# Patient Record
Sex: Female | Born: 1935 | Race: White | Hispanic: No | Smoking: Former smoker
Health system: Southern US, Community
[De-identification: ages and names within clinical notes are randomized; demographics above are authoritative.]

## PROBLEM LIST (undated history)

## (undated) DIAGNOSIS — J189 Pneumonia, unspecified organism: Secondary | ICD-10-CM

## (undated) DIAGNOSIS — K219 Gastro-esophageal reflux disease without esophagitis: Secondary | ICD-10-CM

## (undated) DIAGNOSIS — H3530 Unspecified macular degeneration: Secondary | ICD-10-CM

## (undated) DIAGNOSIS — M1990 Unspecified osteoarthritis, unspecified site: Secondary | ICD-10-CM

## (undated) DIAGNOSIS — R011 Cardiac murmur, unspecified: Secondary | ICD-10-CM

## (undated) DIAGNOSIS — K222 Esophageal obstruction: Secondary | ICD-10-CM

## (undated) DIAGNOSIS — H9190 Unspecified hearing loss, unspecified ear: Secondary | ICD-10-CM

## (undated) DIAGNOSIS — M545 Low back pain, unspecified: Secondary | ICD-10-CM

## (undated) DIAGNOSIS — M7122 Synovial cyst of popliteal space [Baker], left knee: Secondary | ICD-10-CM

## (undated) DIAGNOSIS — G8929 Other chronic pain: Secondary | ICD-10-CM

## (undated) DIAGNOSIS — C189 Malignant neoplasm of colon, unspecified: Secondary | ICD-10-CM

## (undated) DIAGNOSIS — E782 Mixed hyperlipidemia: Secondary | ICD-10-CM

## (undated) DIAGNOSIS — I10 Essential (primary) hypertension: Secondary | ICD-10-CM

## (undated) DIAGNOSIS — Z974 Presence of external hearing-aid: Secondary | ICD-10-CM

## (undated) DIAGNOSIS — T7840XA Allergy, unspecified, initial encounter: Secondary | ICD-10-CM

## (undated) DIAGNOSIS — E7800 Pure hypercholesterolemia, unspecified: Secondary | ICD-10-CM

## (undated) DIAGNOSIS — S83207A Unspecified tear of unspecified meniscus, current injury, left knee, initial encounter: Secondary | ICD-10-CM

## (undated) DIAGNOSIS — H269 Unspecified cataract: Secondary | ICD-10-CM

## (undated) DIAGNOSIS — K743 Primary biliary cirrhosis: Secondary | ICD-10-CM

## (undated) DIAGNOSIS — I7300 Raynaud's syndrome without gangrene: Secondary | ICD-10-CM

## (undated) DIAGNOSIS — I872 Venous insufficiency (chronic) (peripheral): Secondary | ICD-10-CM

## (undated) DIAGNOSIS — Z8719 Personal history of other diseases of the digestive system: Secondary | ICD-10-CM

## (undated) DIAGNOSIS — K449 Diaphragmatic hernia without obstruction or gangrene: Secondary | ICD-10-CM

## (undated) DIAGNOSIS — I499 Cardiac arrhythmia, unspecified: Secondary | ICD-10-CM

## (undated) DIAGNOSIS — M25562 Pain in left knee: Secondary | ICD-10-CM

## (undated) DIAGNOSIS — R296 Repeated falls: Secondary | ICD-10-CM

## (undated) DIAGNOSIS — C44319 Basal cell carcinoma of skin of other parts of face: Secondary | ICD-10-CM

## (undated) DIAGNOSIS — K5730 Diverticulosis of large intestine without perforation or abscess without bleeding: Secondary | ICD-10-CM

## (undated) DIAGNOSIS — Z85038 Personal history of other malignant neoplasm of large intestine: Secondary | ICD-10-CM

## (undated) HISTORY — DX: Unspecified osteoarthritis, unspecified site: M19.90

## (undated) HISTORY — PX: KNEE ARTHROSCOPY: SHX127

## (undated) HISTORY — DX: Cardiac murmur, unspecified: R01.1

## (undated) HISTORY — DX: Primary biliary cirrhosis: K74.3

## (undated) HISTORY — PX: POLYPECTOMY: SHX149

## (undated) HISTORY — PX: APPENDECTOMY: SHX54

## (undated) HISTORY — DX: Essential (primary) hypertension: I10

## (undated) HISTORY — DX: Diverticulosis of large intestine without perforation or abscess without bleeding: K57.30

## (undated) HISTORY — DX: Allergy, unspecified, initial encounter: T78.40XA

## (undated) HISTORY — PX: MOUTH SURGERY: SHX715

## (undated) HISTORY — PX: COLONOSCOPY: SHX174

## (undated) HISTORY — PX: CATARACT EXTRACTION W/ INTRAOCULAR LENS  IMPLANT, BILATERAL: SHX1307

## (undated) HISTORY — DX: Unspecified cataract: H26.9

## (undated) HISTORY — DX: Personal history of other malignant neoplasm of large intestine: Z85.038

## (undated) HISTORY — DX: Gastro-esophageal reflux disease without esophagitis: K21.9

## (undated) HISTORY — DX: Unspecified macular degeneration: H35.30

---
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
## 1971-05-06 HISTORY — PX: TUBAL LIGATION: SHX77

## 2000-11-18 ENCOUNTER — Encounter: Admission: RE | Admit: 2000-11-18 | Discharge: 2000-11-18 | Payer: Self-pay | Admitting: Internal Medicine

## 2000-11-18 ENCOUNTER — Encounter: Payer: Self-pay | Admitting: Internal Medicine

## 2001-02-05 DIAGNOSIS — K5730 Diverticulosis of large intestine without perforation or abscess without bleeding: Secondary | ICD-10-CM | POA: Insufficient documentation

## 2003-09-29 ENCOUNTER — Encounter: Admission: RE | Admit: 2003-09-29 | Discharge: 2003-09-29 | Payer: Self-pay | Admitting: Internal Medicine

## 2005-04-21 ENCOUNTER — Ambulatory Visit: Payer: Self-pay | Admitting: Gastroenterology

## 2005-04-22 ENCOUNTER — Encounter (INDEPENDENT_AMBULATORY_CARE_PROVIDER_SITE_OTHER): Payer: Self-pay | Admitting: Specialist

## 2005-04-22 ENCOUNTER — Ambulatory Visit: Payer: Self-pay | Admitting: Gastroenterology

## 2005-05-13 ENCOUNTER — Ambulatory Visit: Payer: Self-pay | Admitting: Gastroenterology

## 2005-06-09 ENCOUNTER — Ambulatory Visit: Payer: Self-pay | Admitting: Gastroenterology

## 2005-06-27 ENCOUNTER — Ambulatory Visit: Payer: Self-pay | Admitting: Gastroenterology

## 2005-06-27 ENCOUNTER — Encounter: Admission: RE | Admit: 2005-06-27 | Discharge: 2005-06-27 | Payer: Self-pay | Admitting: Internal Medicine

## 2005-08-01 ENCOUNTER — Encounter: Admission: RE | Admit: 2005-08-01 | Discharge: 2005-08-01 | Payer: Self-pay | Admitting: Internal Medicine

## 2005-08-19 ENCOUNTER — Ambulatory Visit: Payer: Self-pay | Admitting: Gastroenterology

## 2005-08-25 ENCOUNTER — Ambulatory Visit: Payer: Self-pay | Admitting: Gastroenterology

## 2005-08-29 ENCOUNTER — Ambulatory Visit: Payer: Self-pay | Admitting: Gastroenterology

## 2005-08-29 DIAGNOSIS — K5190 Ulcerative colitis, unspecified, without complications: Secondary | ICD-10-CM | POA: Insufficient documentation

## 2005-09-30 ENCOUNTER — Ambulatory Visit: Payer: Self-pay | Admitting: Gastroenterology

## 2005-11-21 ENCOUNTER — Ambulatory Visit: Payer: Self-pay | Admitting: Gastroenterology

## 2005-11-27 DIAGNOSIS — H269 Unspecified cataract: Secondary | ICD-10-CM

## 2006-02-13 ENCOUNTER — Ambulatory Visit: Payer: Self-pay | Admitting: Gastroenterology

## 2006-05-05 DIAGNOSIS — K743 Primary biliary cirrhosis: Secondary | ICD-10-CM

## 2006-05-05 DIAGNOSIS — Z85828 Personal history of other malignant neoplasm of skin: Secondary | ICD-10-CM

## 2006-05-05 HISTORY — DX: Personal history of other malignant neoplasm of skin: Z85.828

## 2006-05-05 HISTORY — DX: Primary biliary cirrhosis: K74.3

## 2006-05-08 ENCOUNTER — Ambulatory Visit: Payer: Self-pay | Admitting: Gastroenterology

## 2006-08-03 ENCOUNTER — Ambulatory Visit: Payer: Self-pay | Admitting: Gastroenterology

## 2006-08-17 ENCOUNTER — Ambulatory Visit: Payer: Self-pay | Admitting: Gastroenterology

## 2006-08-27 ENCOUNTER — Ambulatory Visit: Payer: Self-pay | Admitting: Gastroenterology

## 2006-08-27 LAB — CONVERTED CEMR LAB
Bilirubin, Direct: 0.2 mg/dL (ref 0.0–0.3)
INR: 0.9 (ref 0.9–2.0)
Iron: 79 ug/dL (ref 42–145)
Saturation Ratios: 21.9 % (ref 20.0–50.0)
Total Bilirubin: 0.9 mg/dL (ref 0.3–1.2)
Total Protein: 7.3 g/dL (ref 6.0–8.3)
Transferrin: 257.4 mg/dL (ref >=212.0)

## 2006-09-18 ENCOUNTER — Ambulatory Visit (HOSPITAL_COMMUNITY): Admission: RE | Admit: 2006-09-18 | Discharge: 2006-09-18 | Payer: Self-pay | Admitting: Gastroenterology

## 2006-09-30 ENCOUNTER — Ambulatory Visit: Payer: Self-pay | Admitting: Gastroenterology

## 2006-10-05 LAB — CONVERTED CEMR LAB: aPTT: 27.8 s (ref 26.5–36.5)

## 2006-10-28 ENCOUNTER — Encounter: Payer: Self-pay | Admitting: Gastroenterology

## 2006-10-28 ENCOUNTER — Ambulatory Visit (HOSPITAL_COMMUNITY): Admission: RE | Admit: 2006-10-28 | Discharge: 2006-10-28 | Payer: Self-pay | Admitting: Gastroenterology

## 2006-11-02 ENCOUNTER — Ambulatory Visit: Payer: Self-pay | Admitting: Gastroenterology

## 2006-11-11 ENCOUNTER — Ambulatory Visit: Payer: Self-pay | Admitting: Gastroenterology

## 2006-12-23 ENCOUNTER — Ambulatory Visit: Payer: Self-pay | Admitting: Gastroenterology

## 2006-12-23 LAB — CONVERTED CEMR LAB
ALT: 20 units/L (ref 0–35)
AST: 19 units/L (ref 0–37)
Albumin: 3.4 g/dL — ABNORMAL LOW (ref 3.5–5.2)
Alkaline Phosphatase: 64 units/L (ref 39–117)
Total Bilirubin: 0.9 mg/dL (ref 0.3–1.2)

## 2007-08-05 ENCOUNTER — Encounter: Admission: RE | Admit: 2007-08-05 | Discharge: 2007-08-05 | Payer: Self-pay | Admitting: Internal Medicine

## 2007-08-23 DIAGNOSIS — I10 Essential (primary) hypertension: Secondary | ICD-10-CM

## 2007-08-23 DIAGNOSIS — M129 Arthropathy, unspecified: Secondary | ICD-10-CM | POA: Insufficient documentation

## 2008-05-25 ENCOUNTER — Telehealth: Payer: Self-pay | Admitting: Gastroenterology

## 2008-06-07 ENCOUNTER — Ambulatory Visit: Payer: Self-pay | Admitting: Gastroenterology

## 2008-06-07 DIAGNOSIS — K745 Biliary cirrhosis, unspecified: Secondary | ICD-10-CM

## 2008-06-07 LAB — CONVERTED CEMR LAB
ALT: 14 units/L (ref 0–35)
AST: 18 units/L (ref 0–37)
Albumin: 3.5 g/dL (ref 3.5–5.2)
Alkaline Phosphatase: 48 units/L (ref 39–117)
BUN: 17 mg/dL (ref 6–23)
Basophils Absolute: 0 10*3/uL (ref 0.0–0.1)
Bilirubin, Direct: 0.1 mg/dL (ref 0.0–0.3)
Eosinophils Absolute: 0.2 10*3/uL (ref 0.0–0.7)
Eosinophils Relative: 3.3 % (ref 0.0–5.0)
HCT: 44.6 % (ref 36.0–46.0)
MCHC: 34.6 g/dL (ref 30.0–36.0)
MCV: 97.4 fL (ref 78.0–100.0)
Monocytes Absolute: 0.4 10*3/uL (ref 0.1–1.0)
Platelets: 193 10*3/uL (ref 150–400)
Potassium: 4.4 meq/L (ref 3.5–5.1)
RDW: 12.4 % (ref 11.5–14.6)
WBC: 6 10*3/uL (ref 4.5–10.5)

## 2008-08-02 ENCOUNTER — Encounter: Payer: Self-pay | Admitting: Gastroenterology

## 2008-08-29 ENCOUNTER — Encounter: Admission: RE | Admit: 2008-08-29 | Discharge: 2008-08-29 | Payer: Self-pay | Admitting: Internal Medicine

## 2009-06-06 ENCOUNTER — Telehealth: Payer: Self-pay | Admitting: Gastroenterology

## 2009-06-07 ENCOUNTER — Encounter: Payer: Self-pay | Admitting: Gastroenterology

## 2009-07-04 ENCOUNTER — Ambulatory Visit: Payer: Self-pay | Admitting: Gastroenterology

## 2009-07-04 LAB — CONVERTED CEMR LAB
ALT: 14 units/L (ref 0–35)
AST: 18 units/L (ref 0–37)
Albumin: 3.6 g/dL (ref 3.5–5.2)
Eosinophils Relative: 5.1 % — ABNORMAL HIGH (ref 0.0–5.0)
HCT: 46.8 % — ABNORMAL HIGH (ref 36.0–46.0)
Hemoglobin: 15.4 g/dL — ABNORMAL HIGH (ref 12.0–15.0)
Lymphs Abs: 1.7 10*3/uL (ref 0.7–4.0)
MCV: 99.5 fL (ref 78.0–100.0)
Monocytes Relative: 7.2 % (ref 3.0–12.0)
Neutro Abs: 2.8 10*3/uL (ref 1.4–7.7)
RDW: 12.8 % (ref 11.5–14.6)
Total Protein: 7 g/dL (ref 6.0–8.3)
WBC: 5.2 10*3/uL (ref 4.5–10.5)

## 2009-07-10 ENCOUNTER — Telehealth: Payer: Self-pay | Admitting: Gastroenterology

## 2009-09-27 ENCOUNTER — Encounter: Admission: RE | Admit: 2009-09-27 | Discharge: 2009-09-27 | Payer: Self-pay | Admitting: Internal Medicine

## 2010-06-04 NOTE — Assessment & Plan Note (Signed)
Summary: Yearly F/U for refills-rs    History of Present Illness Visit Type: Follow-up Visit Primary GI MD: Erskine Emery MD [facility] Primary Provider: Crist Infante MD Chief Complaint: Yearly F/U Colitis, med refills History of Present Illness:   Heidi [name] has returned for her annual checkup for left sided colitis and primary biliary cirrhosis.  She is on maintenance medicines including lialda and Urso.  She has no GI complaints.   GI Review of Systems      Denies abdominal pain, acid reflux, belching, bloating, chest pain, dysphagia with liquids, dysphagia with solids, heartburn, loss of appetite, nausea, vomiting, vomiting blood, weight loss, and  weight gain.        Denies anal fissure, black tarry stools, change in bowel habit, constipation, diarrhea, diverticulosis, fecal incontinence, heme positive stool, hemorrhoids, irritable bowel syndrome, jaundice, light color stool, liver problems, rectal bleeding, and  rectal pain.    Current Medications (verified): 1)  Lialda 1.2 Gm Tbec (Mesalamine) .Marland Kitchen.. 1 By Mouth Two Times A Day 2)  Ursodiol 300 Mg Caps (Ursodiol) .... Take 1 Tablet By Mouth Two Times A Day 3)  Aspirin 81 Mg  Tabs (Aspirin) .Marland Kitchen.. 1 By Mouth Once Daily 4)  Calcium Carbonate-Vitamin D 600-400 Mg-Unit  Tabs (Calcium Carbonate-Vitamin D) .Marland Kitchen.. 1 By Mouth Two Times A Day  Allergies (verified): 1)  ! Penicillin 2)  ! Codeine  Past History:  Past Medical History: Reviewed history from 08/23/2007 and no changes required. Current Problems:  ARTHRITIS (ICD-716.90) CATARACT, LEFT EYE (ICD-366.9) DIVERTICULOSIS, COLON (ICD-562.10) ULCERATIVE COLITIS (ICD-556.9) HYPERTENSION (ICD-401.9)  Past Surgical History: TUBAL LIGATION Tonsillectomy Knee Arthroscopy  Family History: Reviewed history from 06/07/2008 and no changes required. Family History of Breast Cancer:aunt Family History of Colon Cancer:grandmother Family History of Heart Disease: mother brother  Social  History: Reviewed history from 06/07/2008 and no changes required. Occupation: compliance Patient currently smokes.  1/2 ppd Alcohol Use - yes1 glass wine every night Daily Caffeine Use 4 cups per day Illicit Drug Use - no Patient gets regular exercise.  Review of Systems       All other systems were reviewed and were negative   Vital Signs:  Patient profile:   76 year old female Height:      63 inches Weight:      119.38 pounds BMI:     21.22 Pulse rate:   72 / minute Pulse rhythm:   regular BP sitting:   132 / 76  (left arm) Cuff size:   regular  Vitals Entered By: June McMurray [place] Deborra Medina) (July 04, 2009 8:34 AM)  Physical Exam  Additional Exam:  She is a healthy-appearing female  skin: anicteric HEENT: normocephalic; PEERLA; no nasal or pharyngeal abnormalities neck: supple nodes: no cervical lymphadenopathy chest: clear to ausculatation and percussion heart: no murmurs, gallops, or rubs abd: soft, nontender; BS normoactive; no abdominal masses, tenderness, organomegaly rectal: deferred ext: no cynanosis, clubbing, edema skeletal: no deformities neuro: oriented x 3; no focal abnormalities    Impression & Recommendations:  Problem # 1:  ULCERATIVE COLITIS (ICD-556.9) She has left-sided colitis diagnosed in 2006.  She remains in remission on lialda   Orders: TLB-CBC Platelet - w/Differential (85025-CBCD) TLB-Hepatic/Liver Function Pnl (80076-HEPATIC)  Problem # 2:  BILIARY CIRRHOSIS (ICD-571.6) She has biopsy-proven per hour daily cirrhosis.  Plan to continue Urso Orders: TLB-CBC Platelet - w/Differential (85025-CBCD) TLB-Hepatic/Liver Function Pnl (80076-HEPATIC)  Patient Instructions: 1)  Go to the basement for labs today. 2)  Your refills are sent  to [facility]. 3)  Copy sent to : Crist Infante MD 4)  The medication list was reviewed and reconciled.  All changed / newly prescribed medications were explained.  A complete medication list was provided to  the patient / caregiver. 5)  CC Dr. Crist Infante Prescriptions: LIALDA 1.2 GM TBEC (MESALAMINE) 1 by mouth two times a day  #180 x 3   Entered by:   Bernita Buffy CMA ([place])   Authorized by:   Inda Castle MD   Signed by:   Inda Castle MD on 07/04/2009   Method used:   Electronically to        [facility] (mail-order)             ,          Ph: [phone]       Fax: [phone]   RxID:   [id number]   Appended Document: Yearly F/U for refills-rs    Clinical Lists Changes  Medications: Rx of URSODIOL 300 MG CAPS (URSODIOL) Take 1 tablet by mouth two times a day;  #180 x 3;  Signed;  Entered by: Bernita Buffy CMA ([facility]);  Authorized by: Inda Castle MD;  Method used: Electronically to [facility]*, , ,   , Ph: [phone], Fax: [phone]    Prescriptions: URSODIOL 300 MG CAPS (URSODIOL) Take 1 tablet by mouth two times a day  #180 x 3   Entered by:   Bernita Buffy CMA ([place])   Authorized by:   Inda Castle MD   Signed by:   Bernita Buffy CMA ([place]) on 07/04/2009   Method used:   Electronically to        [facility] (mail-order)             ,          Ph: [phone]       Fax: [phone]   RxID:   [id number]

## 2010-06-04 NOTE — Progress Notes (Signed)
Summary: Med Refill   Phone Note Call from Patient Call back at Work Phone [phone] Call back at [phone]   Call For: Dr Deatra Ina Summary of Call: Heidi [name] if she will need an appoinment to get her med refilled. Has enough medicine to last her until April but uses [employer] order. Initial call taken by: Irwin Brakeman [facility],  June 06, 2009 9:39 AM  Follow-up for Phone Call        Tried to return pts call. Pt needs yearly office appointment scheduled for March. She has enough refills to last till April. Needs follow up before then.  Follow-up by: Genella Mech CMA Deborra Medina),  June 06, 2009 2:21 PM  Additional Follow-up for Phone Call Additional follow up Details #1::        Tried to return pts call again still no answer. Additional Follow-up by: Genella Mech CMA Deborra Medina),  June 07, 2009 9:28 AM    Additional Follow-up for Phone Call Additional follow up Details #2::    Will mail pt letter. Scheduled follow for Wed. 07/04/2009 at 8:45am. For yearly visit and med refills Follow-up by: Genella Mech CMA Deborra Medina),  June 07, 2009 9:31 AM

## 2010-06-04 NOTE — Letter (Signed)
Summary: Appointment Reminder  [facility]  [address] [place], [place] [address]   Phone: [phone]  Fax: [phone]        June 07, 2009 MRN: [id number]    [facility] [place],   [address]    Dear Ms. Kinser,   We have been unable to reach you by phone to schedule a follow up   appointment that was recommended for you by Dr.Kaplan. We have scheduled   your follow up appointment for 07/04/2009 at 8:45 AM. Please bring in all   meds and co-pay. If you need to call and cancel please do so 24 hours in   advance to avoid a 50.00 no show fee.We hope that you allow [place] to   participate in your health care needs. Please contact us at  [phone] at your earliest convenience if you have any questions.         Sincerely,    Genella Mech CMA ([place])

## 2010-06-04 NOTE — Progress Notes (Signed)
Summary: Question about her meds.   Phone Note Call from Patient Call back at [phone]   Call For: Dr Deatra Ina Summary of Call: Question about her medications ordered on the 2nd. when she was here. Initial call taken by: Irwin Brakeman [facility],  July 10, 2009 8:26 AM  Follow-up for Phone Call         Message left to call back   Abel Presto RN  July 10, 2009 10:31 AM  Pt.asking if Smith Mince has been ordered correctly-said she had  been on it three times a day but now ordered b.i.d. Also was taking LIalda tablets both at one time but order now says b.i.d. Please clarify. Follow-up by: Abel Presto RN,  July 10, 2009 10:45 AM  Additional Follow-up for Phone Call Additional follow up Details #1::        urso is two times a day lialda is 2 tabs once daily  Additional Follow-up by: Inda Castle MD,  July 10, 2009 11:47 AM    Additional Follow-up for Phone Call Additional follow up Details #2::    Medication orders reviwed with pt. Follow-up by: Abel Presto RN,  July 10, 2009 12:01 PM

## 2010-07-11 ENCOUNTER — Encounter: Payer: Self-pay | Admitting: Gastroenterology

## 2010-07-25 ENCOUNTER — Encounter: Payer: Self-pay | Admitting: Gastroenterology

## 2010-07-31 ENCOUNTER — Other Ambulatory Visit (INDEPENDENT_AMBULATORY_CARE_PROVIDER_SITE_OTHER): Payer: Medicare Other

## 2010-07-31 ENCOUNTER — Encounter: Payer: Self-pay | Admitting: Gastroenterology

## 2010-07-31 ENCOUNTER — Ambulatory Visit (INDEPENDENT_AMBULATORY_CARE_PROVIDER_SITE_OTHER): Payer: Medicare Other | Admitting: Gastroenterology

## 2010-07-31 VITALS — BP 122/70 | HR 60 | Ht 63.0 in | Wt 116.6 lb

## 2010-07-31 DIAGNOSIS — K745 Biliary cirrhosis, unspecified: Secondary | ICD-10-CM

## 2010-07-31 DIAGNOSIS — K219 Gastro-esophageal reflux disease without esophagitis: Secondary | ICD-10-CM

## 2010-07-31 DIAGNOSIS — K5100 Ulcerative (chronic) pancolitis without complications: Secondary | ICD-10-CM

## 2010-07-31 DIAGNOSIS — K5190 Ulcerative colitis, unspecified, without complications: Secondary | ICD-10-CM

## 2010-07-31 LAB — HEPATIC FUNCTION PANEL
ALT: 13 U/L (ref 0–35)
AST: 17 U/L (ref 0–37)
Bilirubin, Direct: 0.1 mg/dL (ref 0.0–0.3)
Total Bilirubin: 0.6 mg/dL (ref 0.3–1.2)

## 2010-07-31 LAB — CBC WITH DIFFERENTIAL/PLATELET
Eosinophils Relative: 5.1 % — ABNORMAL HIGH (ref 0.0–5.0)
Lymphocytes Relative: 35.5 % (ref 12.0–46.0)
Monocytes Absolute: 0.4 10*3/uL (ref 0.1–1.0)
Monocytes Relative: 6.8 % (ref 3.0–12.0)
Neutrophils Relative %: 52.3 % (ref 43.0–77.0)
Platelets: 184 10*3/uL (ref 150.0–400.0)
WBC: 5.6 10*3/uL (ref 4.5–10.5)

## 2010-07-31 MED ORDER — OMEPRAZOLE 20 MG PO CPDR: 20.0000 mg | DELAYED_RELEASE_CAPSULE | Freq: Every day | ORAL | Status: DC

## 2010-07-31 MED ORDER — URSODIOL 300 MG PO CAPS: 300.0000 mg | ORAL_CAPSULE | Freq: Two times a day (BID) | ORAL | Status: DC

## 2010-07-31 MED ORDER — MESALAMINE 1.2 G PO TBEC: 2.4000 g | DELAYED_RELEASE_TABLET | Freq: Every day | ORAL | Status: DC

## 2010-07-31 NOTE — Patient Instructions (Addendum)
Upper GI Endoscopy Upper GI endoscopy means using a flexible scope to look at the esophagus, stomach and upper small bowel. This is done to make a diagnosis in people with heartburn, abdominal pain, or abnormal bleeding. Sometimes an endoscope is needed to remove foreign bodies or food that become stuck in the esophagus; it can also be used to take biopsy samples. For the best results, do not eat or drink for 8 hours before having your upper endoscopy.  To perform the endoscopy, you will probably be sedated and your throat will be numbed with a special spray. The endoscope is then slowly passed down your throat (this will not interfere with your breathing). An endoscopy exam takes 15-30 minutes to complete and there is no real pain. Patients rarely remember much about the procedure. The results of the test may take several days if a biopsy or other test is taken.  You may have a sore throat after an endoscopy exam. Serious complications are very rare. Stick to liquids and soft foods until your pain is better. You should not drive a car or operate any dangerous equipment for at least 24 hours after being sedated. SEEK IMMEDIATE MEDICAL CARE IF:  You have severe throat pain.   You have shortness of breath.   You have bleeding problems.   You have a fever.   You have difficulty recovering from your sedation.  Document Released: 05/29/2004 Document Re-Released: 07/16/2009 [facility] Patient Information 2011 [place]. Your EGD is scheduled on 08/02/2010 at 3pm CC.Mark Perini,MD Medications were resent to patients med co mail order

## 2010-07-31 NOTE — Progress Notes (Signed)
History of Present Illness:      Review of Systems: Pertinent positive and negative review of systems were noted in the above HPI section. All other review of systems were otherwise negative.    Current Medications, Allergies, Past Medical History, Past Surgical History, Family History and Social History were reviewed in [facility] record  Vital signs were reviewed in today's medical record. Physical Exam: General: Well developed , well nourished, no acute distress Head: Normocephalic and atraumatic Eyes:  sclerae anicteric, EOMI Ears: Normal auditory acuity Mouth: No deformity or lesions Lungs: Clear throughout to auscultation Heart: Regular rate and rhythm; no murmurs, rubs or bruits Abdomen: Soft, non tender and non distended. No masses, hepatosplenomegaly or hernias noted. Normal Bowel sounds Rectal:deferred Musculoskeletal: Symmetrical with no gross deformities  Pulses:  Normal pulses noted Extremities: No clubbing, cyanosis, edema or deformities noted Neurological: Alert oriented x 4, grossly nonfocal Psychological:  Alert and cooperative. Normal mood and affect   Heidi [name] has returned for her annual checkup for her left-sided ulcerative colitis. This was diagnosed in 2006. On lialda she is without lower GI complaints. She also has primary biliary cirrhosis for which she takes urso.  She denies abdominal pain or pruritus.  The patient is complaining of frequent pyrosis and dysphagia to solids. She's on no acid suppression medications.

## 2010-07-31 NOTE — Assessment & Plan Note (Signed)
She remains in clinical remission on Urso. Plan to continue current medications.

## 2010-07-31 NOTE — Assessment & Plan Note (Signed)
This is a new problem, complicated by dysphagia, no doubt related to an early esophageal stricture.  Recommendations #1 begin omeprazole 20 mg a day. #2 upper endoscopy with dilatation as indicated  Risks, alternatives, and complications of the procedure, including bleeding, perforation, and possible need for surgery, were explained to the patient.  Patient's questions were answered.

## 2010-07-31 NOTE — Assessment & Plan Note (Addendum)
Left-sided colitis, diagnosed 2006. She is symptom-free on lialda.   Recommendations #1 check daily CBC and LFTs. #2 continue  lialda

## 2010-08-01 ENCOUNTER — Encounter: Payer: Self-pay | Admitting: Gastroenterology

## 2010-08-02 ENCOUNTER — Encounter: Payer: Self-pay | Admitting: Gastroenterology

## 2010-08-02 ENCOUNTER — Ambulatory Visit (AMBULATORY_SURGERY_CENTER): Payer: Medicare Other | Admitting: Gastroenterology

## 2010-08-02 VITALS — HR 54 | Temp 96.7°F | Resp 20 | Ht 62.5 in | Wt 116.0 lb

## 2010-08-02 DIAGNOSIS — R1310 Dysphagia, unspecified: Secondary | ICD-10-CM

## 2010-08-02 DIAGNOSIS — K259 Gastric ulcer, unspecified as acute or chronic, without hemorrhage or perforation: Secondary | ICD-10-CM

## 2010-08-02 DIAGNOSIS — K222 Esophageal obstruction: Secondary | ICD-10-CM

## 2010-08-02 DIAGNOSIS — K319 Disease of stomach and duodenum, unspecified: Secondary | ICD-10-CM

## 2010-08-02 NOTE — Patient Instructions (Addendum)
Discharge Instructions reviewed  [place] diet reviewed  Continue Prilosec  Multiple ulcers found in pyloric channel, also in antrum

## 2010-08-05 ENCOUNTER — Telehealth: Payer: Self-pay | Admitting: *Deleted

## 2010-08-05 NOTE — Telephone Encounter (Signed)
Follow up Call- Patient questions:  Do you have a fever, pain , or abdominal swelling? no Pain Score  0 *  Have you tolerated food without any problems? yes  Have you been able to return to your normal activities? yes  Do you have any questions about your discharge instructions: Diet   no Medications  no Follow up visit  no  Do you have questions or concerns about your Care? no  Actions: * If pain score is 4 or above: No action needed, pain <4.  Pt told will get results letter in 2  weeks

## 2010-08-06 ENCOUNTER — Encounter: Payer: Self-pay | Admitting: Gastroenterology

## 2010-08-06 NOTE — Procedures (Signed)
Summary: Upper Endoscopy  Patient: Alekhya Gravlin Note: All result statuses are Final unless otherwise noted.  Tests: (1) Upper Endoscopy (EGD)   EGD Upper Endoscopy       [facility]     [place], [place]  [address]          ENDOSCOPY PROCEDURE REPORT          PATIENT:  Heidi [name], Heidi [name]  MR#:  [id number]     BIRTHDATE:  05-27-1934, 83 yrs. old  GENDER:  female          ENDOSCOPIST:  Sandy Salaam. Deatra Ina, MD     Referred by:  Crist Infante, M.D.          PROCEDURE DATE:  08/02/2010     PROCEDURE:  EGD with biopsy, [address], Maloney Dilation of Esophagus     ASA CLASS:  Class II     INDICATIONS:  dysphagia          MEDICATIONS:   Fentanyl 50 mcg IV, Versed 3 mg IV, glycopyrrolate     ([place]) 0.2 mg IV, 0.6cc simethancone 0.6 cc PO     TOPICAL ANESTHETIC:  Exactacain Spray          DESCRIPTION OF PROCEDURE:   After the risks benefits and     alternatives of the procedure were thoroughly explained, informed     consent was obtained.  The [facility] [id number] endoscope was     introduced through the mouth and advanced to the third portion of     the duodenum, without limitations.  The instrument was slowly     withdrawn as the mucosa was fully examined.     <<PROCEDUREIMAGES>>          A stricture was found at the gastroesophageal junction (see     image11). Moderate esophageal stricture Dilation with maloney     dilator 34m Mild resistance; no heme  Multiple ulcers were found     pyloric channel 2 superficial ulcers; bxs taken (see image6 and     image7).  An ulcer was found in the antrum (see image5).     Superficial linear ulcer  Otherwise the examination was normal     (see image3, image4, image9, image10, and image13).    Retroflexed     views revealed no abnormalities.    The scope was then withdrawn     from the patient and the procedure completed.          COMPLICATIONS:  None          ENDOSCOPIC IMPRESSION:     1) Stricture at  the gastroesophageal junction - s/p maloney     dilitation     2) Ulcers, multiple in the pyloric channel     3) Ulcer in the antrum     4) Otherwise normal examination     RECOMMENDATIONS:     1) continue PPI     2) dilatations PRN          REPEAT EXAM:  No          ______________________________     RSandy Salaam KDeatra Ina MD          CC:          n.     eSIGNED:   RSandy Salaam Kaplan at 08/02/2010 03:28 PM          SDespina Hick [id number] Note: An exclamation mark (Marland Kitchen  indicates a result that was not dispersed into the flowsheet. Document Creation Date: 08/02/2010 3:28 PM _______________________________________________________________________  (1) Order result status: Final Collection or observation date-time: 08/02/2010 15:05 Requested date-time:  Receipt date-time:  Reported date-time:  Referring Physician:   Ordering Physician: Erskine Emery [id number]) Specimen Source:  Source: Tawanna Cooler Order Number: [id number] Lab site:

## 2010-09-17 NOTE — Assessment & Plan Note (Signed)
[facility]                         GASTROENTEROLOGY OFFICE NOTE   NAME:Smedley, JAMILETH PUTZIER                     MRN:          [id number]  DATE:09/30/2006                            DOB:          July 11, 1934    PROBLEM:  Abnormal liver tests.   Mrs. Levee has returned for scheduled followup. Serologies were  positive for an antimitochondrial antibody of 74.8 and anti-smooth  muscle antibody of 88. An MRCP did not demonstrate any abnormalities.  Mrs. Benningfield is feeling well. Her colitis is in remission.   PHYSICAL EXAMINATION:  Pulse 56, blood pressure 114/64, weight 124.   IMPRESSION:  Abnormal liver tests. I am suspicious that she has either  primary biliary cirrhosis or sclerosing cholangitis. Certainly the  latter is associated with ulcerative colitis. Recommendations is liver  biopsy.     Sandy Salaam. Deatra Ina, MD,FACG  Electronically Signed    RDK/MedQ  DD: 09/30/2006  DT: 09/30/2006  Job #: [id number]   cc:   Elta [place] A. Perini, M.D.

## 2010-09-17 NOTE — Assessment & Plan Note (Signed)
[facility]                         GASTROENTEROLOGY OFFICE NOTE   NAME:[name], Heidi YOHE                     MRN:          [id number]  DATE:11/11/2006                            DOB:          06-Mar-1935    PROBLEM:  Abnormal liver tests.   [name] has returned following liver biopsy.  Biopsies were  consistent with primary biliary cirrhosis.  [name] remains symptom  free from this abnormality, and her colitis symptoms are well  controlled.   EXAMINATION:  Pulse 64.  Blood pressure 114/72.  Weight 123.   IMPRESSION:  1. Primary biliary cirrhosis.  This certainly would account for her      liver test abnormalities.  2. Colitis.   RECOMMENDATIONS:  Begin Actigall 300 mg 3 times a day.  Continue Lialda  2.4 gm daily.     Sandy Salaam. Deatra Ina, MD,FACG  Electronically Signed    RDK/MedQ  DD: 11/11/2006  DT: 11/11/2006  Job #: [id number]   cc:   Elta Gu[place] A. Perini, M.D.

## 2010-09-20 ENCOUNTER — Other Ambulatory Visit: Payer: Self-pay | Admitting: Gastroenterology

## 2010-09-20 NOTE — Assessment & Plan Note (Signed)
[facility]                         GASTROENTEROLOGY OFFICE NOTE   NAME:Kaucher, SHANIAH BALTES                     MRN:          [id number]  DATE:08/27/2006                            DOB:          01-03-1935    PROBLEM:  1. Colitis.  2. Abnormal liver test.   Ms. Schewe has returned for scheduled GI followup.  She has left-sided  colitis for which she is taking Lialda 2.4 grams a day.  Recent abnormal  liver tests were noted in conjunction with a drug study protocol where  she was receiving either study drug or placebo.  Her colitic symptoms  have been well controlled.  She is without pain or diarrhea.  She does  note that she has had intermittent abnormal liver tests for the past 10  years.  There is no history of jaundice, hepatitis, drug use, or  excessive alcohol use.   Other medications include calcium and baby aspirin.   EXAMINATION:  Pulse 72, blood pressure 122/82, weight 124.   On August 18, 2006 ALT was 171, AST 125, other liver tests were normal.  On August 04, 2006 ALT was 62 and AST was 51.  Both tests were drawn  during the study protocol.  On May 11, 2006 liver tests were normal.   IMPRESSION:  Abnormal liver tests.  This has been a chronic problem that  preceded drug trials.  She could have an underlying chronic hepatitis or  other causes for chronic liver disease including hemochromatosis or PBC.  With her history of ulcerative colitis I have some concern that she  could have sclerosing cholangitis.   RECOMMENDATIONS:  1. Check serologies for hepatitis B and C, AMA, ANA, anti-smooth      muscle antibody, iron, TIBC and ferritin levels.  2. To consider MRI and MRCP if there is some question of sclerosing      cholangitis.     Sandy Salaam. Deatra Ina, MD,FACG  Electronically Signed    RDK/MedQ  DD: 08/27/2006  DT: 08/27/2006  Job #: [id number]   cc:   Elta [place] A. Perini, M.D.

## 2010-10-02 ENCOUNTER — Other Ambulatory Visit: Payer: Self-pay | Admitting: Gastroenterology

## 2010-10-02 DIAGNOSIS — K219 Gastro-esophageal reflux disease without esophagitis: Secondary | ICD-10-CM

## 2010-10-02 MED ORDER — OMEPRAZOLE 20 MG PO CPDR: 20.0000 mg | DELAYED_RELEASE_CAPSULE | Freq: Every day | ORAL | Status: DC

## 2010-10-02 NOTE — Telephone Encounter (Signed)
Pt informed med sent l/m

## 2011-03-25 ENCOUNTER — Encounter (INDEPENDENT_AMBULATORY_CARE_PROVIDER_SITE_OTHER): Payer: Medicare Other | Admitting: Ophthalmology

## 2011-03-25 DIAGNOSIS — H43819 Vitreous degeneration, unspecified eye: Secondary | ICD-10-CM

## 2011-03-25 DIAGNOSIS — H3530 Unspecified macular degeneration: Secondary | ICD-10-CM

## 2011-04-22 ENCOUNTER — Telehealth: Payer: Self-pay | Admitting: Gastroenterology

## 2011-04-22 NOTE — Telephone Encounter (Signed)
Patient reports that she sees some pink in the bowl after a BM.  She does not see blood in the stool or on the tissue.  No other GI complaints at this time.  She has a history of UC.  She denies diarrhea, urgency, or abdominal cramping.  She is currently treated with Lialda 2 a day.  Dr Olevia Perches please advise you are MD of the day.

## 2011-04-23 NOTE — Telephone Encounter (Signed)
Last flex sigm 04/2005- UC. Please start Proctofoam #1, apply to rectum qhs, if too expensive, use Cort-enema 1 hs,x 14 days then taper to 3x /week x 2 weeks then 1x /week,, # 30, 1 refill. If symptoms  continue, please schedule for colonoscopy, she is due anyway.

## 2011-04-24 MED ORDER — HYDROCORTISONE ACE-PRAMOXINE 1-1 % RE FOAM: 1.0000 | Freq: Every day | RECTAL | Status: AC

## 2011-04-24 NOTE — Telephone Encounter (Signed)
Patient advised of Dr Nichola Sizer recommendations.  She will call back for further issues

## 2011-04-24 NOTE — Telephone Encounter (Signed)
Left message for patient to call back  

## 2011-05-27 ENCOUNTER — Encounter: Payer: Self-pay | Admitting: Gastroenterology

## 2011-05-27 ENCOUNTER — Ambulatory Visit (INDEPENDENT_AMBULATORY_CARE_PROVIDER_SITE_OTHER): Payer: Medicare Other | Admitting: Gastroenterology

## 2011-05-27 DIAGNOSIS — K222 Esophageal obstruction: Secondary | ICD-10-CM | POA: Insufficient documentation

## 2011-05-27 DIAGNOSIS — K745 Biliary cirrhosis, unspecified: Secondary | ICD-10-CM

## 2011-05-27 DIAGNOSIS — K625 Hemorrhage of anus and rectum: Secondary | ICD-10-CM | POA: Insufficient documentation

## 2011-05-27 DIAGNOSIS — K5190 Ulcerative colitis, unspecified, without complications: Secondary | ICD-10-CM | POA: Diagnosis not present

## 2011-05-27 NOTE — Progress Notes (Signed)
History of Present Illness:  Heidi [name] has returned for follow up of dysphagia.  She underwent endoscopy with dilatation of the distal esophageal stricture in March, 2012. Since that time she has felt well. She has left-sided colitis and recently has noted minimal amounts of blood when she moves her bowels. She is without abdominal pain or diarrhea. Last colonoscopy was 2006.   Review of Systems: Pertinent positive and negative review of systems were noted in the above HPI section. All other review of systems were otherwise negative.    Current Medications, Allergies, Past Medical History, Past Surgical History, Family History and Social History were reviewed in [facility] record  Vital signs were reviewed in today's medical record. Physical Exam: General: Well developed , well nourished, no acute distress

## 2011-05-27 NOTE — Patient Instructions (Signed)
Your Procedure is scheduled on 06/02/2011 at 1:30pm  Flexible Sigmoidoscopy Your caregiver has ordered a flexible sigmoidoscopy. This is an exam to evaluate your lower colon. In this exam your colon is cleansed and a short fiber optic tube is inserted through your rectum and into your colon. The fiber optic scope (endoscope) is a short bundle of enclosed flexible small glass fibers. It transmits light to the area examined and images from that area to your caregiver. You do not have to worry about glass breakage in the endoscope. Discomfort is usually minimal. Sedatives and pain medications are generally not required. This exam helps to detect tumors (lumps), polyps, inflammation (swelling and soreness), and areas of bleeding. It may also be used to take biopsies. These are small pieces of tissue taken to examine under a microscope. LET YOUR CAREGIVER KNOW ABOUT:  Allergies.   Medications taken including herbs, eye drops, over the counter medications, and creams.   Use of steroids (by mouth or creams).   Previous problems with anesthetics or novocaine   Possibility of pregnancy, if this applies.   History of blood clots (thrombophlebitis).   History of bleeding or blood problems.   Previous surgery.   Other health problems.  BEFORE THE PROCEDURE Eat normally the night before the exam. Your caregiver may order a mild enema or laxative the night before. No eating or drinking should occur after midnight until the procedure is completed. A rectal suppository or enemas may be given in the morning prior to your procedure. You will be brought to the examination area in a hospital gown. You should be present 60 minutes prior to your procedure or as directed.  AFTER THE PROCEDURE   There is sometimes a little blood passed with the first bowel movement. Do not be concerned. Because air is often used during the exam, it is not unusual to pass gas and experience abdominal (belly) cramping. Walking or a  warm pack on your abdomen may help with this. Do not sleep with a heating pad as burns can occur.   You may resume all normal eating and activities.   Only take over-the-counter or prescription medicines for pain, discomfort, or fever as directed by your caregiver. Do not use aspirin or blood thinners if a biopsy (tissue sample) was taken. Consult your caregiver for medication usage if biopsies were taken.   Call for your results as instructed by your caregiver. Remember, it is your responsibility to obtain the results of your biopsy. Do not assume everything is fine because you do not hear from your caregiver.  SEEK IMMEDIATE MEDICAL CARE IF:  An oral temperature above 102 F (38.9 C) develops.   You pass large blood clots or fill a toilet with blood following the procedure. This may also occur 10 to 14 days following the procedure. It is more likely if a biopsy was taken.   You develop abdominal pain not relieved with medication or that is getting worse rather than better.  Document Released: 04/18/2000 Document Revised: 01/01/2011 Document Reviewed: 01/29/2005 [facility] Patient Information 2012 [place].

## 2011-05-27 NOTE — Assessment & Plan Note (Signed)
Bleeding could be due to hemorrhoids or possibly from her colitis.  Recommendations #1 flexible sigmoidoscopy

## 2011-05-27 NOTE — Assessment & Plan Note (Signed)
Significantly improved following dilatation therapy.

## 2011-05-27 NOTE — Assessment & Plan Note (Signed)
Patient remains in clinical remission on lialda with the exception of minimal rectal bleeding which may be due to other causes.

## 2011-05-27 NOTE — Assessment & Plan Note (Signed)
Recent liver test normal. Stable function on Urso

## 2011-06-02 ENCOUNTER — Ambulatory Visit (AMBULATORY_SURGERY_CENTER): Payer: Medicare Other | Admitting: Gastroenterology

## 2011-06-02 ENCOUNTER — Encounter: Payer: Self-pay | Admitting: Gastroenterology

## 2011-06-02 VITALS — BP 158/79 | HR 62 | Temp 97.1°F | Resp 14 | Ht 62.0 in | Wt 118.0 lb

## 2011-06-02 DIAGNOSIS — K5730 Diverticulosis of large intestine without perforation or abscess without bleeding: Secondary | ICD-10-CM | POA: Diagnosis not present

## 2011-06-02 DIAGNOSIS — K648 Other hemorrhoids: Secondary | ICD-10-CM

## 2011-06-02 DIAGNOSIS — K625 Hemorrhage of anus and rectum: Secondary | ICD-10-CM

## 2011-06-02 DIAGNOSIS — K745 Biliary cirrhosis, unspecified: Secondary | ICD-10-CM | POA: Diagnosis not present

## 2011-06-02 DIAGNOSIS — K5190 Ulcerative colitis, unspecified, without complications: Secondary | ICD-10-CM | POA: Diagnosis not present

## 2011-06-02 MED ORDER — HYDROCORTISONE ACETATE 25 MG RE SUPP: 25.0000 mg | Freq: Two times a day (BID) | RECTAL | Status: AC

## 2011-06-02 MED ORDER — SODIUM CHLORIDE 0.9 % IV SOLN: 500.0000 mL | INTRAVENOUS | Status: DC

## 2011-06-02 NOTE — Op Note (Signed)
[place] [facility] [address]. [place], [place]  [address]  FLEXIBLE SIGMOIDOSCOPY PROCEDURE REPORT  PATIENT:  Heidi [name], Heidi [name]  MR#:  [id number] BIRTHDATE:  1935/03/28, 32 yrs. old  GENDER:  female  ENDOSCOPIST:  Sandy Salaam. Deatra Ina, MD Referred by:  Crist Infante, M.D.  PROCEDURE DATE:  06/02/2011 PROCEDURE:  Flexible Sigmoidoscopy, diagnostic ASA CLASS:  Class II INDICATIONS:  rectal bleeding h/o left sided colitis  MEDICATIONS:   MAC sedation, administered by CRNA propofol 126mIV  DESCRIPTION OF PROCEDURE:   After the risks benefits and alternatives of the procedure were thoroughly explained, informed consent was obtained.  Digital rectal exam was performed and revealed no abnormalities.   The LB-CF-H180AL [id number] was introduced through the anus and advanced to the sigmoid colon, without limitations.  The quality of the prep was .  The instrument was then slowly withdrawn as the mucosa was fully examined. <<PROCEDUREIMAGES>>  Moderate diverticulosis was found in the sigmoid colon (see image2).  Internal hemorrhoids were found (see image3).  The examination was otherwise normal (see image1).   Retroflexed views in the rectum revealed no abnormalities.    The scope was then withdrawn from the patient and the procedure terminated.  COMPLICATIONS:  None  ENDOSCOPIC IMPRESSION: 1) Moderate diverticulosis in the sigmoid colon 2) Internal hemorrhoids 3) Otherwise normal examination.  Rectal bleeding is secondary to hemorrhoids RECOMMENDATIONS:ANusol supp  REPEAT EXAM:  No  ______________________________ RSandy Salaam KDeatra Ina MD  CC:  n. eSIGNED:   RSandy Salaam Payslie Mccaig at 06/02/2011 01:56 PM  SDespina Hick [phone]

## 2011-06-02 NOTE — Patient Instructions (Signed)
Hemorrhoids Hemorrhoids are enlarged (dilated) veins around the rectum. There are 2 types of hemorrhoids, and the type of hemorrhoid is determined by its location. Internal hemorrhoids occur in the veins just inside the rectum.They are usually not painful, but they may bleed.However, they may poke through to the outside and become irritated and painful. External hemorrhoids involve the veins outside the anus and can be felt as a painful swelling or hard lump near the anus.They are often itchy and may crack and bleed. Sometimes clots will form in the veins. This makes them swollen and painful. These are called thrombosed hemorrhoids. CAUSES Causes of hemorrhoids include:  Pregnancy. This increases the pressure in the hemorrhoidal veins.   Constipation.   Straining to have a bowel movement.   Obesity.   Heavy lifting or other activity that caused you to strain.  TREATMENT Most of the time hemorrhoids improve in 1 to 2 weeks. However, if symptoms do not seem to be getting better or if you have a lot of rectal bleeding, your caregiver may perform a procedure to help make the hemorrhoids get smaller or remove them completely.Possible treatments include:  Rubber band ligation. A rubber band is placed at the base of the hemorrhoid to cut off the circulation.   Sclerotherapy. A chemical is injected to shrink the hemorrhoid.   Infrared light therapy. Tools are used to burn the hemorrhoid.   Hemorrhoidectomy. This is surgical removal of the hemorrhoid.  HOME CARE INSTRUCTIONS   Increase fiber in your diet. Ask your caregiver about using fiber supplements.   Drink enough water and fluids to keep your urine clear or pale yellow.   Exercise regularly.   Go to the bathroom when you have the urge to have a bowel movement. Do not wait.   Avoid straining to have bowel movements.   Keep the anal area dry and clean.   Only take over-the-counter or prescription medicines for pain, discomfort,  or fever as directed by your caregiver.  If your hemorrhoids are thrombosed:  Take warm sitz baths for 20 to 30 minutes, 3 to 4 times per day.   If the hemorrhoids are very tender and swollen, place ice packs on the area as tolerated. Using ice packs between sitz baths may be helpful. Fill a plastic bag with ice. Place a towel between the bag of ice and your skin.   Medicated creams and suppositories may be used or applied as directed.   Do not use a donut-shaped pillow or sit on the toilet for long periods. This increases blood pooling and pain.  SEEK MEDICAL CARE IF:   You have increasing pain and swelling that is not controlled with your medicine.   You have uncontrolled bleeding.   You have difficulty or you are unable to have a bowel movement.   You have pain or inflammation outside the area of the hemorrhoids.   You have chills or an oral temperature above 102 F (38.9 C).  MAKE SURE YOU:   Understand these instructions.   Will watch your condition.   Will get help right away if you are not doing well or get worse.  Document Released: 04/18/2000 Document Revised: 01/01/2011 Document Reviewed: 08/24/2007 [facility] Patient Information 2012 [place].

## 2011-06-02 NOTE — Progress Notes (Signed)
Patient did not experience any of the following events: a burn prior to discharge; a fall within the facility; wrong site/side/patient/procedure/implant event; or a hospital transfer or hospital admission upon discharge from the facility. (G8907) Patient did not have preoperative order for IV antibiotic SSI prophylaxis. (G8918)  

## 2011-06-03 ENCOUNTER — Telehealth: Payer: Self-pay | Admitting: *Deleted

## 2011-06-03 NOTE — Telephone Encounter (Signed)
Message left

## 2011-07-30 ENCOUNTER — Other Ambulatory Visit: Payer: Self-pay | Admitting: Gastroenterology

## 2011-07-30 ENCOUNTER — Encounter: Payer: Self-pay | Admitting: Gastroenterology

## 2011-07-30 NOTE — Telephone Encounter (Signed)
Error

## 2011-07-30 NOTE — Telephone Encounter (Signed)
Patient to come get same  tommorow

## 2011-08-14 DIAGNOSIS — E785 Hyperlipidemia, unspecified: Secondary | ICD-10-CM | POA: Diagnosis not present

## 2011-12-16 DIAGNOSIS — M949 Disorder of cartilage, unspecified: Secondary | ICD-10-CM | POA: Diagnosis not present

## 2011-12-16 DIAGNOSIS — M899 Disorder of bone, unspecified: Secondary | ICD-10-CM | POA: Diagnosis not present

## 2011-12-22 DIAGNOSIS — L570 Actinic keratosis: Secondary | ICD-10-CM | POA: Diagnosis not present

## 2011-12-22 DIAGNOSIS — L578 Other skin changes due to chronic exposure to nonionizing radiation: Secondary | ICD-10-CM | POA: Diagnosis not present

## 2012-01-02 DIAGNOSIS — R809 Proteinuria, unspecified: Secondary | ICD-10-CM | POA: Diagnosis not present

## 2012-01-02 DIAGNOSIS — R82998 Other abnormal findings in urine: Secondary | ICD-10-CM | POA: Diagnosis not present

## 2012-01-02 DIAGNOSIS — M899 Disorder of bone, unspecified: Secondary | ICD-10-CM | POA: Diagnosis not present

## 2012-01-02 DIAGNOSIS — M949 Disorder of cartilage, unspecified: Secondary | ICD-10-CM | POA: Diagnosis not present

## 2012-01-02 DIAGNOSIS — E785 Hyperlipidemia, unspecified: Secondary | ICD-10-CM | POA: Diagnosis not present

## 2012-01-02 DIAGNOSIS — I10 Essential (primary) hypertension: Secondary | ICD-10-CM | POA: Diagnosis not present

## 2012-01-09 DIAGNOSIS — I10 Essential (primary) hypertension: Secondary | ICD-10-CM | POA: Diagnosis not present

## 2012-01-09 DIAGNOSIS — E785 Hyperlipidemia, unspecified: Secondary | ICD-10-CM | POA: Diagnosis not present

## 2012-01-09 DIAGNOSIS — K449 Diaphragmatic hernia without obstruction or gangrene: Secondary | ICD-10-CM | POA: Diagnosis not present

## 2012-01-09 DIAGNOSIS — Z23 Encounter for immunization: Secondary | ICD-10-CM | POA: Diagnosis not present

## 2012-01-09 DIAGNOSIS — H3530 Unspecified macular degeneration: Secondary | ICD-10-CM | POA: Diagnosis not present

## 2012-01-09 DIAGNOSIS — Z0000 Encounter for general adult medical examination without abnormal findings: Secondary | ICD-10-CM | POA: Diagnosis not present

## 2012-01-09 DIAGNOSIS — F17200 Nicotine dependence, unspecified, uncomplicated: Secondary | ICD-10-CM | POA: Diagnosis not present

## 2012-01-10 ENCOUNTER — Other Ambulatory Visit: Payer: Self-pay | Admitting: Gastroenterology

## 2012-02-03 DIAGNOSIS — H903 Sensorineural hearing loss, bilateral: Secondary | ICD-10-CM | POA: Diagnosis not present

## 2012-03-24 ENCOUNTER — Encounter (INDEPENDENT_AMBULATORY_CARE_PROVIDER_SITE_OTHER): Payer: Medicare Other | Admitting: Ophthalmology

## 2012-03-24 DIAGNOSIS — H3530 Unspecified macular degeneration: Secondary | ICD-10-CM

## 2012-03-24 DIAGNOSIS — H43819 Vitreous degeneration, unspecified eye: Secondary | ICD-10-CM

## 2012-04-17 DIAGNOSIS — Z23 Encounter for immunization: Secondary | ICD-10-CM | POA: Diagnosis not present

## 2012-05-05 DIAGNOSIS — Z85038 Personal history of other malignant neoplasm of large intestine: Secondary | ICD-10-CM

## 2012-05-05 HISTORY — DX: Personal history of other malignant neoplasm of large intestine: Z85.038

## 2012-06-15 DIAGNOSIS — N950 Postmenopausal bleeding: Secondary | ICD-10-CM | POA: Diagnosis not present

## 2012-06-23 DIAGNOSIS — N950 Postmenopausal bleeding: Secondary | ICD-10-CM | POA: Diagnosis not present

## 2012-06-30 ENCOUNTER — Telehealth: Payer: Self-pay | Admitting: Gastroenterology

## 2012-06-30 NOTE — Telephone Encounter (Signed)
Left message for pt to call back.  Pt states that she thinks her UC Is out of control and has been for a while. States she is having frequent BM's and was having some bleeding. Offered pt an appt with [facility] this week but pt wants to wait and see Dr. Deatra Ina. Pt scheduled to see Dr. Deatra Ina 07/14/12@2 :30pm. Pt aware of appt date and time.

## 2012-07-14 ENCOUNTER — Ambulatory Visit (INDEPENDENT_AMBULATORY_CARE_PROVIDER_SITE_OTHER): Payer: Medicare Other | Admitting: Gastroenterology

## 2012-07-14 ENCOUNTER — Other Ambulatory Visit (INDEPENDENT_AMBULATORY_CARE_PROVIDER_SITE_OTHER): Payer: Medicare Other

## 2012-07-14 ENCOUNTER — Encounter: Payer: Self-pay | Admitting: Gastroenterology

## 2012-07-14 VITALS — BP 138/92 | HR 76 | Ht 62.5 in | Wt 125.2 lb

## 2012-07-14 DIAGNOSIS — R198 Other specified symptoms and signs involving the digestive system and abdomen: Secondary | ICD-10-CM

## 2012-07-14 DIAGNOSIS — K5190 Ulcerative colitis, unspecified, without complications: Secondary | ICD-10-CM | POA: Diagnosis not present

## 2012-07-14 DIAGNOSIS — R194 Change in bowel habit: Secondary | ICD-10-CM

## 2012-07-14 LAB — CBC WITH DIFFERENTIAL/PLATELET
Basophils Relative: 0.7 % (ref 0.0–3.0)
Eosinophils Absolute: 0.4 10*3/uL (ref 0.0–0.7)
Hemoglobin: 14.9 g/dL (ref 12.0–15.0)
MCHC: 33.8 g/dL (ref 30.0–36.0)
MCV: 93 fl (ref 78.0–100.0)
Monocytes Absolute: 0.5 10*3/uL (ref 0.1–1.0)
Neutro Abs: 2.6 10*3/uL (ref 1.4–7.7)
Neutrophils Relative %: 49.5 % (ref 43.0–77.0)
RBC: 4.73 Mil/uL (ref 3.87–5.11)

## 2012-07-14 LAB — COMPREHENSIVE METABOLIC PANEL
CO2: 31 mEq/L (ref 19–32)
Creatinine, Ser: 0.8 mg/dL (ref 0.4–1.2)
GFR: 72.05 mL/min (ref >60.00)
Glucose, Bld: 97 mg/dL (ref 70–99)
Total Bilirubin: 0.9 mg/dL (ref 0.3–1.2)

## 2012-07-14 MED ORDER — BUDESONIDE 9 MG PO TB24: 1.0000 | ORAL_TABLET | Freq: Every morning | ORAL | Status: DC

## 2012-07-14 NOTE — Progress Notes (Signed)
History of Present Illness:  Mrs Fenter has returned for evaluation of diarrhea. She has a history of left-sided colitis for which she takes lialda.  Sigmoidoscopy in January, 2013 showed diverticulosis only. Over the past month she's been complaining of frequent poorly formed stools up to 8 times a day acompanied by tenesmus and lower abdominal discomfort. She's had minimal bleeding.  She remains on lialda.  She has a history of an esophageal stricture and was dilated in 2012. She denies dysphagia.    Review of Systems: Pertinent positive and negat She's complaining of multiple loose stools up to 8-10 times a day with tenesmus and lower bowel disquietudeive review of systems were noted in the above HPI section. All other review of systems were otherwise negative.    Current Medications, Allergies, Past Medical History, Past Surgical History, Family History and Social History were reviewed in [facility] record  Vital signs were reviewed in today's medical record. Physical Exam: General: Well developed , well nourished, no acute distress Skin: anicteric Head: Normocephalic and atraumatic Eyes:  sclerae anicteric, EOMI Ears: Normal auditory acuity Mouth: No deformity or lesions Lungs: Clear throughout to auscultation Heart: Regular rate and rhythm; no murmurs, rubs or bruits Abdomen: Soft, non tender and non distended. No masses, hepatosplenomegaly or hernias noted. Normal Bowel sounds Rectal:deferred Musculoskeletal: Symmetrical with no gross deformities  Pulses:  Normal pulses noted Extremities: No clubbing, cyanosis, edema or deformities noted Neurological: Alert oriented x 4, grossly nonfocal Psychological:  Alert and cooperative. Normal mood and affect

## 2012-07-14 NOTE — Patient Instructions (Addendum)
Your medication is being sent to your pharmacy  Call back in one week to let us know how your doing Follow up in 3-4 weeks

## 2012-07-14 NOTE — Assessment & Plan Note (Addendum)
Patient appears to be having a mild flareup of colitis.  Medications #1 continue lialda #2 begin  uceris 9 mg daily. Patient was instructed to call back in one week. If not improved I would consider colonoscopy.

## 2012-07-14 NOTE — Assessment & Plan Note (Signed)
Plan to continue Urso and to repeat liver tests.

## 2012-07-21 ENCOUNTER — Telehealth: Payer: Self-pay | Admitting: Gastroenterology

## 2012-07-21 DIAGNOSIS — R197 Diarrhea, unspecified: Secondary | ICD-10-CM

## 2012-07-21 NOTE — Telephone Encounter (Signed)
Check stool for C. difficile toxin, CNS Discontinue uceris Schedule colonoscopy Begin prednisone 40 mg daily; call back in 5 days

## 2012-07-21 NOTE — Telephone Encounter (Signed)
Pt was seen last week and started on Uceris 68m daily, she is still on Lialda. Pt was told to call back in a week with an update. Pt states that she does not see blood in her stool now but she is still having about 10 stools/day. States she is having to wear a pad due to accidents. Dr. KDeatra Inaplease advise.

## 2012-07-21 NOTE — Telephone Encounter (Signed)
Left message for pt to call back  °

## 2012-07-22 ENCOUNTER — Other Ambulatory Visit: Payer: Medicare Other

## 2012-07-22 DIAGNOSIS — R197 Diarrhea, unspecified: Secondary | ICD-10-CM

## 2012-07-22 MED ORDER — PREDNISONE 20 MG PO TABS: 40.0000 mg | ORAL_TABLET | Freq: Every day | ORAL | Status: DC

## 2012-07-22 NOTE — Telephone Encounter (Signed)
Spoke with pt and she is aware. Orders entered in epic, script sent to pharmacy, and pt scheduled for previsit and colon.

## 2012-07-23 LAB — CLOSTRIDIUM DIFFICILE BY PCR: Toxigenic C. Difficile by PCR: NOT DETECTED

## 2012-07-26 ENCOUNTER — Ambulatory Visit (AMBULATORY_SURGERY_CENTER): Payer: Medicare Other

## 2012-07-26 ENCOUNTER — Telehealth: Payer: Self-pay

## 2012-07-26 VITALS — Ht 62.0 in | Wt 126.0 lb

## 2012-07-26 DIAGNOSIS — K5190 Ulcerative colitis, unspecified, without complications: Secondary | ICD-10-CM

## 2012-07-26 DIAGNOSIS — R198 Other specified symptoms and signs involving the digestive system and abdomen: Secondary | ICD-10-CM

## 2012-07-26 LAB — STOOL CULTURE

## 2012-07-26 MED ORDER — NA SULFATE-K SULFATE-MG SULF 17.5-3.13-1.6 GM/177ML PO SOLN: 1.0000 | Freq: Once | ORAL | Status: DC

## 2012-07-26 NOTE — Telephone Encounter (Signed)
Please clarify the date for her to call back. 07/25/12 was yesterday.

## 2012-07-26 NOTE — Telephone Encounter (Signed)
Lower to 49m qd; c/b 07/25/12

## 2012-07-26 NOTE — Telephone Encounter (Signed)
Pt has been taking prednisone 10m daily and states she is doing better. Started taking it on 07/21/12 and was to call back with an update. Dr. KDeatra Inaplease advise.

## 2012-07-27 NOTE — Telephone Encounter (Signed)
Left message for pt to call back.  Spoke with pt and she is aware.

## 2012-07-27 NOTE — Telephone Encounter (Signed)
07/30/12 

## 2012-07-30 ENCOUNTER — Telehealth: Payer: Self-pay | Admitting: Gastroenterology

## 2012-07-30 NOTE — Telephone Encounter (Signed)
Spoke with pt and she states her symptoms are better but she is still having frequent bowel movements. She is not having diarrhea and she has not seen any blood or mucous in her stool. States she is better and is currently taking prednisone 21m daily. Dr. KDeatra Inaaware.

## 2012-07-30 NOTE — Telephone Encounter (Signed)
Spoke with pt and she is aware.

## 2012-07-30 NOTE — Telephone Encounter (Signed)
Lower pred to 1m x 5 days, then pred 127mqd x 5, pred 1057md x 5, pred 35m78md x 5, pred 5mg 60mx 5, pred 5mg q28mx 5,  D/c

## 2012-08-02 ENCOUNTER — Other Ambulatory Visit: Payer: Self-pay | Admitting: Gastroenterology

## 2012-08-03 ENCOUNTER — Other Ambulatory Visit: Payer: Self-pay | Admitting: Dermatology

## 2012-08-03 DIAGNOSIS — L821 Other seborrheic keratosis: Secondary | ICD-10-CM | POA: Diagnosis not present

## 2012-08-03 DIAGNOSIS — L578 Other skin changes due to chronic exposure to nonionizing radiation: Secondary | ICD-10-CM | POA: Diagnosis not present

## 2012-08-03 DIAGNOSIS — D485 Neoplasm of uncertain behavior of skin: Secondary | ICD-10-CM | POA: Diagnosis not present

## 2012-08-05 ENCOUNTER — Ambulatory Visit (AMBULATORY_SURGERY_CENTER): Payer: Medicare Other | Admitting: Gastroenterology

## 2012-08-05 ENCOUNTER — Encounter: Payer: Self-pay | Admitting: Gastroenterology

## 2012-08-05 VITALS — BP 153/88 | HR 66 | Temp 97.5°F | Resp 14 | Ht 62.0 in | Wt 126.0 lb

## 2012-08-05 DIAGNOSIS — K5730 Diverticulosis of large intestine without perforation or abscess without bleeding: Secondary | ICD-10-CM

## 2012-08-05 DIAGNOSIS — K5090 Crohn's disease, unspecified, without complications: Secondary | ICD-10-CM | POA: Diagnosis not present

## 2012-08-05 DIAGNOSIS — K5190 Ulcerative colitis, unspecified, without complications: Secondary | ICD-10-CM

## 2012-08-05 DIAGNOSIS — R198 Other specified symptoms and signs involving the digestive system and abdomen: Secondary | ICD-10-CM

## 2012-08-05 DIAGNOSIS — K222 Esophageal obstruction: Secondary | ICD-10-CM | POA: Diagnosis not present

## 2012-08-05 DIAGNOSIS — D126 Benign neoplasm of colon, unspecified: Secondary | ICD-10-CM | POA: Diagnosis not present

## 2012-08-05 DIAGNOSIS — K219 Gastro-esophageal reflux disease without esophagitis: Secondary | ICD-10-CM | POA: Diagnosis not present

## 2012-08-05 MED ORDER — SODIUM CHLORIDE 0.9 % IV SOLN: 500.0000 mL | INTRAVENOUS | Status: DC

## 2012-08-05 NOTE — Progress Notes (Signed)
Stable to RR 

## 2012-08-05 NOTE — Op Note (Signed)
[place]  [address]. [place], [address]   COLONOSCOPY PROCEDURE REPORT  PATIENT: Heidi [name], Heidi [name]  MR#: [id number] BIRTHDATE: 07/24/1934 , 77  yrs. old GENDER: Female ENDOSCOPIST: Inda Castle, MD REFERRED HB:ZJIR Perini, M.D. PROCEDURE DATE:  08/05/2012 PROCEDURE:   Colonoscopy with biopsy and Colonoscopy with snare polypectomy ASA CLASS:   Class II INDICATIONS:recent flareup of left-sided colitis. MEDICATIONS: MAC sedation, administered by CRNA and propofol (Diprivan) 225m IV  DESCRIPTION OF PROCEDURE:   After the risks benefits and alternatives of the procedure were thoroughly explained, informed consent was obtained.  A digital rectal exam revealed no abnormalities of the rectum.   The LB CF-H180AL [id number] endoscope was introduced through the anus and advanced to the cecum, which was identified by both the appendix and ileocecal valve. No adverse events experienced.   The quality of the prep was Suprep excellent The instrument was then slowly withdrawn as the colon was fully examined.      COLON FINDINGS: In the cecum there was a sessile polyp measuring approximately 2 cm in length.  Polyp was relatively flat.  There was a second polyp measuring about 15 mm in length.  Biopsies of the larger polyp were taken.  In the mid transverse colon there is a sessile 3-4 mm polyp.  This was removed with cold polypectomy snare and submitted to pathology. The mucosa in the proximal colon was normal.  Beginning in the mid sigmoid at approximately 35 cm and extending more distally there was very faint erythema.  Biopsies were taken. There is diffuse moderate diverticulosis in the sigmoid and descending colon.   In the cecum there was a sessile polyp measuring approximately 2 cm in length.  Polyp was relatively flat. There was a second polyp measuring about 15 mm in length. Biopsies of the larger polyp were taken.  In the mid transverse colon there is a  sessile 3-4 mm polyp.  This was removed with cold polypectomy snare and submitted to pathology. The mucosa in the proximal colon was normal.  Beginning in the mid sigmoid at approximately 35 cm and extending more distally there was very faint erythema.  Biopsies were taken. There is diffuse moderate diverticulosis in the sigmoid and descending colon.  Retroflexed views revealed no abnormalities. The time to cecum=minutes 0 seconds.  Withdrawal time=8 minutes 51 seconds.  The scope was withdrawn and the procedure completed. COMPLICATIONS: There were no complications.  ENDOSCOPIC IMPRESSION: 1.  sessile cecal polyps 2.  left-sided colitis-very mildly active 3.  diverticulosis  RECOMMENDATIONS: await biopsy findings Continue steroid taper Office visit 2-3 weeks  eSigned:  RInda Castle MD 08/05/2012 2:41 PM   cc:   PATIENT NAME:  [name], EkstromMR#: [id number]

## 2012-08-05 NOTE — Patient Instructions (Addendum)
YOU HAD AN ENDOSCOPIC PROCEDURE TODAY AT [facility]: Refer to the procedure report that was given to you for any specific questions about what was found during the examination.  If the procedure report does not answer your questions, please call your gastroenterologist to clarify.  If you requested that your care partner not be given the details of your procedure findings, then the procedure report has been included in a sealed envelope for you to review at your convenience later.  YOU SHOULD EXPECT: Some feelings of bloating in the abdomen. Passage of more gas than usual.  Walking can help get rid of the air that was put into your GI tract during the procedure and reduce the bloating. If you had a lower endoscopy (such as a colonoscopy or flexible sigmoidoscopy) you may notice spotting of blood in your stool or on the toilet paper. If you underwent a bowel prep for your procedure, then you may not have a normal bowel movement for a few days.  DIET: Your first meal following the procedure should be a light meal and then it is ok to progress to your normal diet.  A half-sandwich or bowl of soup is an example of a good first meal.  Heavy or fried foods are harder to digest and may make you feel nauseous or bloated.  Likewise meals heavy in dairy and vegetables can cause extra gas to form and this can also increase the bloating.  Drink plenty of fluids but you should avoid alcoholic beverages for 24 hours.  ACTIVITY: Your care partner should take you home directly after the procedure.  You should plan to take it easy, moving slowly for the rest of the day.  You can resume normal activity the day after the procedure however you should NOT DRIVE or use heavy machinery for 24 hours (because of the sedation medicines used during the test).    SYMPTOMS TO REPORT IMMEDIATELY: A gastroenterologist can be reached at any hour.  During normal business hours, 8:30 AM to 5:00 PM Monday through Friday,  call [phone].  After hours and on weekends, please call the GI answering service at [phone] who will take a message and have the physician on call contact you.   Following lower endoscopy (colonoscopy or flexible sigmoidoscopy):  Excessive amounts of blood in the stool  Significant tenderness or worsening of abdominal pains  Swelling of the abdomen that is new, acute  Fever of 100F or higher    FOLLOW UP: If any biopsies were taken you will be contacted by phone or by letter within the next 1-3 weeks.  Call your gastroenterologist if you have not heard about the biopsies in 3 weeks.  Our staff will call the home number listed on your records the next business day following your procedure to check on you and address any questions or concerns that you may have at that time regarding the information given to you following your procedure. This is a courtesy call and so if there is no answer at the home number and we have not heard from you through the emergency physician on call, we will assume that you have returned to your regular daily activities without incident.  SIGNATURES/CONFIDENTIALITY: You and/or your care partner have signed paperwork which will be entered into your electronic medical record.  These signatures attest to the fact that that the information above on your After Visit Summary has been reviewed and is understood.  Full responsibility of the confidentiality  of this discharge information lies with you and/or your care-partner.    INFORMATION ON POLYPS/ ,DIVERTICULOSIS GIVEN TO YOU TODAY  CONTINUE STEROID TAPER  MAKE APPOINTMENT TO SEE DR. KAPLAN IN HIS OFFICE FOR 2-3 WEEKS  AWAIT BIOPSY RESULTS

## 2012-08-05 NOTE — Progress Notes (Signed)
Patient did not experience any of the following events: a burn prior to discharge; a fall within the facility; wrong site/side/patient/procedure/implant event; or a hospital transfer or hospital admission upon discharge from the facility. (G8907) Patient did not have preoperative order for IV antibiotic SSI prophylaxis. (G8918)  

## 2012-08-05 NOTE — Progress Notes (Signed)
Called to room to assist during endoscopic procedure.  Patient ID and intended procedure confirmed with present staff. Received instructions for my participation in the procedure from the performing physician.  

## 2012-08-06 ENCOUNTER — Telehealth: Payer: Self-pay | Admitting: *Deleted

## 2012-08-06 NOTE — Telephone Encounter (Signed)
  Follow up Call-  Call back number 08/05/2012 06/02/2011 08/02/2010  Post procedure Call Back phone  # [phone]  Permission to leave phone message Yes Yes -     Patient questions:  Do you have a fever, pain , or abdominal swelling? no Pain Score  0 *  Have you tolerated food without any problems? yes  Have you been able to return to your normal activities? yes  Do you have any questions about your discharge instructions: Diet   no Medications  no Follow up visit  no  Do you have questions or concerns about your Care? no  Actions: * If pain score is 4 or above: No action needed, pain <4.

## 2012-08-09 ENCOUNTER — Telehealth: Payer: Self-pay | Admitting: Gastroenterology

## 2012-08-09 NOTE — Telephone Encounter (Signed)
Pt states she started having nausea/vomiting and diarrhea last night. Pt states that she is feeling better and is no longer nauseated and her last stool was not diarrhea. Pt thinks she may have had a virus. Pt knows to call us back for further problems but she is feeling much better now.

## 2012-08-23 ENCOUNTER — Encounter: Payer: Self-pay | Admitting: *Deleted

## 2012-09-01 ENCOUNTER — Ambulatory Visit (INDEPENDENT_AMBULATORY_CARE_PROVIDER_SITE_OTHER): Payer: Medicare Other | Admitting: Gastroenterology

## 2012-09-01 ENCOUNTER — Encounter: Payer: Self-pay | Admitting: Gastroenterology

## 2012-09-01 VITALS — BP 164/86 | HR 60 | Ht 62.0 in | Wt 125.2 lb

## 2012-09-01 DIAGNOSIS — K625 Hemorrhage of anus and rectum: Secondary | ICD-10-CM | POA: Diagnosis not present

## 2012-09-01 DIAGNOSIS — K5190 Ulcerative colitis, unspecified, without complications: Secondary | ICD-10-CM | POA: Diagnosis not present

## 2012-09-01 DIAGNOSIS — K6389 Other specified diseases of intestine: Secondary | ICD-10-CM | POA: Diagnosis not present

## 2012-09-01 MED ORDER — HYDROCORTISONE ACETATE 25 MG RE SUPP: 25.0000 mg | Freq: Two times a day (BID) | RECTAL | Status: DC

## 2012-09-01 NOTE — Assessment & Plan Note (Addendum)
Limited rectal bleeding is very likely due to hemorrhoids.  Recommendations #1 Anusol HC suppositories

## 2012-09-01 NOTE — Assessment & Plan Note (Addendum)
Cecal mass highly suspicious for neoplasm. Mass will have to be removed surgically. This case was discussed at GI cancer conference.  Because of her history of left-sided colitis consideration was made to do a total colectomy. The consensus, however, was that, due to her age, she would be better served by a right hemicolectomy with careful yearly surveillance colonoscopy.

## 2012-09-01 NOTE — Progress Notes (Signed)
History of Present Illness:  Heidi [name] has returned following colonoscopy. She has left-sided colitis that has improved with a recent course of prednisone. She also has primary bili cirrhosis. Incidentally noted was a large cecal polyp suspicious for a neoplasm. Biopsies demonstrated high-grade glandular dysplasia.  Patient also has had intermittent episodes of bright red blood per rectum consisting of blood and her underclothes or in the bowl. This has occurred 2 or 3 times in the past 6 months.    Review of Systems: Pertinent positive and negative review of systems were noted in the above HPI section. All other review of systems were otherwise negative.    Current Medications, Allergies, Past Medical History, Past Surgical History, Family History and Social History were reviewed in [facility] record  Vital signs were reviewed in today's medical record. Physical Exam: General: Well developed , well nourished, no acute distress

## 2012-09-01 NOTE — Assessment & Plan Note (Addendum)
Recent flareup of left-sided colitis that has responded ultimately to prednisone. Plan to continue taper.

## 2012-09-01 NOTE — Patient Instructions (Addendum)
We will schedule you an appointment with Dr Barry Dienes with [facility] and contact you when that appointment becomes available Your appointment is scheduled for 5-12 at a 10am arrival

## 2012-09-07 ENCOUNTER — Ambulatory Visit (INDEPENDENT_AMBULATORY_CARE_PROVIDER_SITE_OTHER): Payer: Medicare Other | Admitting: General Surgery

## 2012-09-07 ENCOUNTER — Encounter (INDEPENDENT_AMBULATORY_CARE_PROVIDER_SITE_OTHER): Payer: Self-pay | Admitting: General Surgery

## 2012-09-07 VITALS — HR 72 | Temp 97.7°F | Resp 16 | Ht 62.0 in | Wt 125.2 lb

## 2012-09-07 DIAGNOSIS — C180 Malignant neoplasm of cecum: Secondary | ICD-10-CM

## 2012-09-07 NOTE — Patient Instructions (Signed)
Do bowel prep pre op as tolerated.  Partial Removal of the colon Your medical providers are recommending removing part of your colon for removable polyp.  Based on the location and type of colon problem you have, the following will help describe what happens when you have this surgery.  TREATMENT Surgery is the most common treatment for colorectal cancer. It is a type of local therapy. It treats the cancer in the colon or rectum and the area close to the tumor by removing the tumor and some of the healthy tissue around it. This tissue includes an apron of tissue with lymph nodes and blood vessels.  We usually use laparoscopy (smaller incisions) to help minimize the size of the larger incision on your abdomen.  However, even if we use laparoscopy primarily, we still need to make an incision large enough to bring the two ends of the colon out to reconnect them.  For tumors that are very large or very adherent to the adjacent structures, we may have to make a larger incision to do your operation safely. The surgeon checks the rest of the abdomen, the intestine and the liver to see if the cancer has spread.  There may be microscopic disease present that we are unable to detect.  We may do a liver ultrasound during surgery as part of this evaluation.    When a section of the colon or rectum is removed, we can usually reconnect the healthy parts. However, sometimes reconnection is not possible. In this case, we will make a new path for your bowel movements to leave the body. This is an opening (a stoma) in the wall of the abdomen. The upper end of the intestine is then connected to the stoma. The other end is closed. The operation to create the stoma is called a colostomy. A flat bag fits over the stoma to collect waste, and a special adhesive holds it in place.  Most patients are able to do their normal activities with a colostomy, including travelling, exercising, and swimming.    Some colostomies are  temporary. The colostomy is needed only until the colon or rectum heals from surgery. Some patients will need to get additional treatment such as chemotherapy before the ostomy can be taken down.  After this occurs, we can reconnect the parts of the intestine and closes the stoma.  Some  patients need a permanent colostomy, and some patients are too frail to undergo another surgery to take the ostomy down.    The part of colon that we plan to remove in your case is the right colon.  Whatever the operative findings, I will discuss the case with your family after we are done in the operating room.  I will talk to you in the next few days when you are more awake.  I will see you in the hospital every weekday that I am not out of town.  My partners help see patients on the weekends and if I am out of town.     IF YOU ARE TAKING ASPIRIN, PLAVIX, COUMADIN, OR OTHER BLOOD THINNERS, LET us KNOW IMMEDIATELY SO WE CAN CONTACT YOUR PRESCRIBING HEALTH CARE PROVIDER TO HOLD THE MEDICATION FOR 5-7 DAYS BEFORE SURGERY    WHAT HAPPENS AFTER SURGERY: After surgery, you will go first to the recovery room, then to your room.   You will have many tubes and lines in place which is standard for this operation.  You will have several IVs, including possibly a  central line in your chest or neck.  YOU WILL NOT BE ABLE TO EAT FOR SEVERAL DAYS AFTER SURGERY.  You will have a catheter in your bladder for around 1-3 days.  On your abdomen, you will have and possibly a pain pump with numbing medicine.  You will have compression stockings on your legs to decrease the risk of blood clots.    We will address your pain in several ways.  We will use an IV pain pump called a "PCA," or Patient Controlled Analgesia.  This allows you to press a button and immediately receive a dose of pain medication without waiting for a nurse.  We also use IV Tylenol and sometimes IV Toradol which is similar to ibuprofen.  You may also have a pump with  numbing medicine delivered directly to your incision.  I use doses and medications that work for the majority of people, but you may need an adjustment to the dose or type of medicine if your pain is not adequately controlled.  Your throat may be sore, in which case you may need a throat spray or lozenges.  Some patients become disoriented with the pain medication and may need adjustments due to that.    We will ask you to get out of bed the day after surgery in order to maximize your chances of not having complications.  Your risk of pneumonia and blood clots is lower with walking and sitting in the chair.  We will also ask you to perform breathing exercises.  We will also ask to you walk in your room and in the halls for the above reasons, but also in order for you to keep up your strength.    EATING: We will usually start you on clear liquids in around 1-2 days if your bowel function seems to be returning.  We advance your diet slowly to make sure you are tolerating each step.  All patients do not have a normal appetite when they go home.  Many patients also find that their taste buds do not seem the same right after surgery, and this can continue into the time of possible post operative chemotherapy and radiation.  We may need to use different doses or type of medicine than you use at home to control high blood pressure, diabetes, or other medical problems.   SLEEPING: We do not give sleeping medications in the hospital routinely.  Many patients have difficulty sleeping due to the unfamiliar environment or the medical care that occurs at night.  Sleeping medications can interfere with the pain medications and cause significant mental status changes that are dangerous.    OTHER TREATMENT? I will present your case when the pathology is available at our GI Multidisciplinary conference which occurs every 1-2 weeks.  Medical and Radiation Oncologists are present, as well as the pathologist and radiologist  in addition to myself.  If you have a larger tumor or if you have positive lymph nodes, we may have the oncologist stop by to see you while you are in the hospital.  Most firm post op treatment plans occur, however, once you are an outpatient because we will need to see how you are recovering from surgery.  GOING HOME! Usually you are able to go home in 4-8 days, depending on whether or not complications happen and what is going on with your overall health status.   If you have more health problems or if you have limited help at home, the therapists and nurses  may recommend a temporary rehab or nursing facility to help you get back on your feet before you go home.  These decisions would be made while you are in the hospital with the assistance of a social worker or case manager.  You cannot do any heavy lifting for 6-8 weeks due to the risk of hernia.    Please bring all insurance/disability forms to our office for the staff to fill out   POSSIBLE COMPLICATIONS This is a major operation and includes complications listed below: Bleeding Infection and possible wound complications such as hernia Damage to adjacent structures Leak of surgical connections, which can lead to other surgeries and possibly an ostomy. (5-10%) Possible need for other procedures, such as abscess drains in radiology  Possible prolonged hospital stay Possible diarrhea from removal of part of the colon. Possible constipation from narcotics.    Prolonged fatigue/weakness/appetite MOST PATIENTS' ENERGY LEVEL IS NOT BACK TO NORMAL FOR AT LEAST 2-4 MONTHS.  OLDER PATIENTS MAY FEEL WEAK FOR LONGER PERIODS OF TIME.   Difficulty with eating or post operative nausea  Possible early recurrence of cancer Possible complications of your medical problems such as heart disease or arrhythmias. Death (less than 1%)  All possible complications are not listed, just the most common.    FURTHER INFORMATION? Please ask questions if you  find something that we did not discuss in the office and would like more information.  If you would like another appointment if you have many questions or if your family members would like to come as well, please contact the office.    FOLLOW-UP CARE  Follow-up care after treatment for colorectal cancer is important. Even when the cancer seems to have been completely removed or destroyed, the disease sometimes returns. Undetected cancer cells may still remain somewhere in the body after treatment. We check your recovery and for recurrence of the cancer. Recurrence means that the cancer comes back.  Checkups help make sure that changes in health are found. Checkups may include:  A physical exam (possibly including a digital rectal exam). This means your caregiver checks you to see if there are any abnormal changes they can see or feel.   Lab tests (CEA test) may be done. The "fecal occult blood test" checks for blood in the stool. The CEA (carcinoembryonic antigen) is a blood test that looks for a marker of colon cancer in the blood.   A colonoscopy is a test where your caregiver examines your colon with a flexible instrument like a thin telescope which looks at the inside of the large bowel. All patients with colon cancer are recommended to get a colonoscopy 1 year after the surgery  Other specialized x-rays, CT scans, or other tests may be performed.    Between scheduled visits you should contact your caregivers as soon as any health problems appear.

## 2012-09-08 ENCOUNTER — Telehealth (INDEPENDENT_AMBULATORY_CARE_PROVIDER_SITE_OTHER): Payer: Self-pay

## 2012-09-08 NOTE — Telephone Encounter (Signed)
Patient given appt time as below

## 2012-09-08 NOTE — Telephone Encounter (Signed)
Pt's po 10/04/12 at 3:30pm.

## 2012-09-09 ENCOUNTER — Telehealth: Payer: Self-pay | Admitting: Gastroenterology

## 2012-09-09 MED ORDER — URSODIOL 300 MG PO CAPS: 300.0000 mg | ORAL_CAPSULE | Freq: Two times a day (BID) | ORAL | Status: DC

## 2012-09-09 MED ORDER — MESALAMINE 1.2 G PO TBEC: 1200.0000 mg | DELAYED_RELEASE_TABLET | Freq: Two times a day (BID) | ORAL | Status: DC

## 2012-09-09 NOTE — Telephone Encounter (Signed)
Called pt to inform meds sent

## 2012-09-10 ENCOUNTER — Encounter (HOSPITAL_COMMUNITY): Payer: Self-pay | Admitting: Pharmacy Technician

## 2012-09-10 DIAGNOSIS — C180 Malignant neoplasm of cecum: Secondary | ICD-10-CM | POA: Insufficient documentation

## 2012-09-10 NOTE — Assessment & Plan Note (Signed)
Patient does have a long history of ulcerative colitis. However, she has never had any dysplasia found in her colon other than this polyp in the cecum. Given her age and her propensity toward diarrhea, we will plan to just do a right sided hemicolectomy.  She would be a very poor candidate for a total colectomy with ileal pouch anal anastomosis. She has frequent surveillance with colonoscopy.  I will plan to do this laparoscopically. We did review the surgery and risks. I advised the patient that we would have a few port sites and then a small incision to create a connection between the terminal ileum and the transverse colon. She is in agreement to proceed. I reviewed the risk of anastomotic leak, bleeding, infection, possible ostomy, blood clot, pneumonia, urinary infection, damage to adjacent structures, need for additional surgeries or procedures. We will schedule this at the first available opportunity.

## 2012-09-10 NOTE — Progress Notes (Signed)
Chief Complaint  Patient presents with  . New Evaluation    eval cecal mass    HISTORY: Patient is a 78 year old female that Dr. Deatra Ina has been following for ulcerative colitis. She has only been on mesalamine for a while. She had not had a flare for many years until this year. She started having some increased diarrhea.  Dr. Deatra Ina performed colonoscopy and found a cecal polyp with high-grade dysplasia. She had some minimal erythema of the left side of the colon and rectum. There was no dysplasia on this side. She denies nausea or vomiting. She has not had any weight loss. 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 She states that she was actually diagnosed with PBC first. She does not have any history of colon cancer, breast cancer, prostate cancer, ovarian cancer in her family. She has had some rectal bleeding. She drinks wine daily generally.  Past Medical History  Diagnosis Date  . Arthritis   . Cataract     Left Eye  . Diverticulosis of colon   . Ulcerative colitis   . GERD (gastroesophageal reflux disease)   . Allergy   . Cancer     skin CA  . Macular degeneration   . Heart murmur   . Osteoporosis     osteopenia  . Ulcerative colitis   . Cirrhosis, primary biliary     Past Surgical History  Procedure Laterality Date  . Tubal ligation    . Tonsillectomy    . Knee arthroscopy    . Cataracts      both eyes  . Colonoscopy      Current Outpatient Prescriptions  Medication Sig Dispense Refill  . aspirin 81 MG tablet Take 81 mg by mouth daily.        . calcium carbonate (OS-CAL) 600 MG TABS Take 600 mg by mouth daily.       . Coenzyme Q10 (COQ-10) 200 MG CAPS Take 1 capsule by mouth daily.      . cyanocobalamin 500 MCG tablet Take 500 mcg by mouth daily.       . mometasone (NASONEX) 50 MCG/ACT nasal spray Place 2 sprays into the nose daily as needed. Allergies.      . Multiple Vitamins-Minerals (PRESERVISION AREDS PO) Take 1 tablet by  mouth 2 (two) times daily.      . Pitavastatin Calcium (LIVALO) 4 MG TABS Take 0.5 tablets by mouth 2 (two) times a week.       . ezetimibe (ZETIA) 10 MG tablet Take 5 mg by mouth 2 (two) times a week.      . hydrocortisone (ANUSOL-HC) 25 MG suppository Place 1 suppository (25 mg total) rectally every 12 (twelve) hours.  12 suppository  1  . mesalamine (LIALDA) 1.2 G EC tablet Take 2,400 mg by mouth daily with breakfast.      . omeprazole (PRILOSEC) 20 MG capsule Take 20 mg by mouth daily.      . ursodiol (ACTIGALL) 300 MG capsule Take 1 capsule (300 mg total) by mouth 2 (two) times daily.  180 capsule  4   No current facility-administered medications for this visit.     Allergies  Allergen Reactions  . Codeine Nausea And Vomiting  . Penicillins Hives     Family History  Problem Relation Age of Onset  . Breast cancer Paternal Aunt   . Colon cancer Paternal Grandmother   . Heart disease Mother   . Heart disease Brother   .  Stroke Father   . Cancer Brother     lung     History   Social History  . Marital Status: Widowed    Spouse Name: N/A    Number of Children: 3  . Years of Education: N/A   Occupational History  . Compliance     insurance  .     Social History Main Topics  . Smoking status: Former Smoker -- 0.50 packs/day for 45 years    Types: Cigarettes    Quit date: 01/16/2012  . Smokeless tobacco: Never Used  . Alcohol Use: 4.2 oz/week    7 Glasses of wine per week  . Drug Use: No  . Sexually Active: None   Other Topics Concern  . None   Social History Narrative  . None     REVIEW OF SYSTEMS - PERTINENT POSITIVES ONLY: 12 point review of systems negative other than HPI and PMH except for rectal bleeding.    EXAM: Filed Vitals:   09/07/12 1500  Pulse: 72  Temp: 97.7 F (36.5 C)  Resp: 16    Gen:  No acute distress.  Well nourished and well groomed.   Neurological: Alert and oriented to person, place, and time. Coordination normal.  Head:  Normocephalic and atraumatic.  Eyes: Conjunctivae are normal. Pupils are equal, round, and reactive to light. No scleral icterus.  Neck: Normal range of motion. Neck supple. No tracheal deviation or thyromegaly present.  Cardiovascular: Normal rate, regular rhythm, normal heart sounds and intact distal pulses.  Exam reveals no gallop and no friction rub.  No murmur heard. Respiratory: Effort normal.  No respiratory distress. No chest wall tenderness. Breath sounds normal.  No wheezes, rales or rhonchi.  GI: Soft. Bowel sounds are normal. The abdomen is soft and nontender.  There is no rebound and no guarding.  Small periumbilical tubal ligation incision. Musculoskeletal: Normal range of motion. Extremities are nontender.  Lymphadenopathy: No cervical, preauricular, postauricular or axillary adenopathy is present Skin: Skin is warm and dry. No rash noted. No diaphoresis. No erythema. No pallor. No clubbing, cyanosis, or edema.   Psychiatric: Normal mood and affect. Behavior is normal. Judgment and thought content normal.    LABORATORY RESULTS: Available labs are reviewed  Pathology 1. Surgical [P], cecal mass - SUPERFICIAL FRAGMENTS OF HIGH GRADE GLANDULAR DYSPLASIA WITH AREAS SUSPICIOUS FOR INVASION, SEE COMMENT. 2. Surgical [P], transverse - TUBULAR ADENOMA (X1). - NO HIGH GRADE DYSPLASIA OR MALIGNANCY IDENTIFIED. 3. Surgical [P], left side colon - BENIGN COLORECTAL MUCOSA. - NO EVIDENCE OF ACTIVE INFLAMMATION, DYSPLASIA OR MALIGNANCY IDENTIFIED.  RADIOLOGY RESULTS: See E-Chart or I-Site for most recent results.  Images and reports are reviewed. No pertinent imaging.     ASSESSMENT AND PLAN: Cecal polyp with high grade dysplasia Patient does have a long history of ulcerative colitis. However, she has never had any dysplasia found in her colon other than this polyp in the cecum. Given her age and her propensity toward diarrhea, we will plan to just do a right sided  hemicolectomy.  She would be a very poor candidate for a total colectomy with ileal pouch anal anastomosis. She has frequent surveillance with colonoscopy.  I will plan to do this laparoscopically. We did review the surgery and risks. I advised the patient that we would have a few port sites and then a small incision to create a connection between the terminal ileum and the transverse colon. She is in agreement to proceed. I reviewed the risk of anastomotic  leak, bleeding, infection, possible ostomy, blood clot, pneumonia, urinary infection, damage to adjacent structures, need for additional surgeries or procedures. We will schedule this at the first available opportunity.  I advised the patient that prednisone increases the risk of anastomotic leak, wound infection, and other infectious issues.   Milus Height MD Surgical Oncology, General and Endocrine Surgery [facility]   30 minutes were spent in counseling in addition to the examination.     Visit Diagnoses: 1. Cecal cancer     Primary Care Physician: Jerlyn Ly, MD

## 2012-09-13 ENCOUNTER — Encounter (HOSPITAL_COMMUNITY)
Admission: RE | Admit: 2012-09-13 | Discharge: 2012-09-13 | Disposition: A | Discharge status: Home / Self Care | Payer: Medicare Other | Source: Ambulatory Visit | Attending: General Surgery | Admitting: General Surgery

## 2012-09-13 ENCOUNTER — Encounter (INDEPENDENT_AMBULATORY_CARE_PROVIDER_SITE_OTHER): Payer: Self-pay | Admitting: General Surgery

## 2012-09-13 ENCOUNTER — Encounter (HOSPITAL_COMMUNITY): Payer: Self-pay

## 2012-09-13 DIAGNOSIS — K5730 Diverticulosis of large intestine without perforation or abscess without bleeding: Secondary | ICD-10-CM | POA: Diagnosis not present

## 2012-09-13 DIAGNOSIS — M129 Arthropathy, unspecified: Secondary | ICD-10-CM | POA: Diagnosis not present

## 2012-09-13 DIAGNOSIS — C189 Malignant neoplasm of colon, unspecified: Secondary | ICD-10-CM | POA: Diagnosis not present

## 2012-09-13 DIAGNOSIS — Z85828 Personal history of other malignant neoplasm of skin: Secondary | ICD-10-CM | POA: Diagnosis not present

## 2012-09-13 DIAGNOSIS — I10 Essential (primary) hypertension: Secondary | ICD-10-CM | POA: Diagnosis not present

## 2012-09-13 DIAGNOSIS — C180 Malignant neoplasm of cecum: Secondary | ICD-10-CM | POA: Diagnosis not present

## 2012-09-13 DIAGNOSIS — R1900 Intra-abdominal and pelvic swelling, mass and lump, unspecified site: Secondary | ICD-10-CM | POA: Diagnosis not present

## 2012-09-13 DIAGNOSIS — Z87891 Personal history of nicotine dependence: Secondary | ICD-10-CM | POA: Diagnosis not present

## 2012-09-13 DIAGNOSIS — M810 Age-related osteoporosis without current pathological fracture: Secondary | ICD-10-CM | POA: Diagnosis present

## 2012-09-13 DIAGNOSIS — D126 Benign neoplasm of colon, unspecified: Secondary | ICD-10-CM | POA: Diagnosis not present

## 2012-09-13 DIAGNOSIS — H3530 Unspecified macular degeneration: Secondary | ICD-10-CM | POA: Diagnosis present

## 2012-09-13 DIAGNOSIS — Z809 Family history of malignant neoplasm, unspecified: Secondary | ICD-10-CM | POA: Diagnosis not present

## 2012-09-13 DIAGNOSIS — K219 Gastro-esophageal reflux disease without esophagitis: Secondary | ICD-10-CM | POA: Diagnosis not present

## 2012-09-13 DIAGNOSIS — C19 Malignant neoplasm of rectosigmoid junction: Secondary | ICD-10-CM | POA: Diagnosis not present

## 2012-09-13 DIAGNOSIS — M549 Dorsalgia, unspecified: Secondary | ICD-10-CM | POA: Diagnosis not present

## 2012-09-13 DIAGNOSIS — K745 Biliary cirrhosis, unspecified: Secondary | ICD-10-CM | POA: Diagnosis not present

## 2012-09-13 LAB — URINE MICROSCOPIC-ADD ON

## 2012-09-13 LAB — CBC WITH DIFFERENTIAL/PLATELET
Basophils Absolute: 0 10*3/uL (ref 0.0–0.1)
Eosinophils Relative: 6 % — ABNORMAL HIGH (ref 0–5)
Lymphocytes Relative: 34 % (ref 12–46)
MCV: 93.5 fL (ref 78.0–100.0)
Neutro Abs: 3.3 10*3/uL (ref 1.7–7.7)
Platelets: 185 10*3/uL (ref 150–400)
RDW: 14 % (ref 11.5–15.5)
WBC: 6.5 10*3/uL (ref 4.0–10.5)

## 2012-09-13 LAB — URINALYSIS, ROUTINE W REFLEX MICROSCOPIC
Glucose, UA: NEGATIVE mg/dL
Nitrite: NEGATIVE
Protein, ur: NEGATIVE mg/dL
Urobilinogen, UA: 0.2 mg/dL (ref 0.0–1.0)

## 2012-09-13 LAB — COMPREHENSIVE METABOLIC PANEL
ALT: 9 U/L (ref 0–35)
AST: 18 U/L (ref 0–37)
CO2: 29 mEq/L (ref 19–32)
Calcium: 9.5 mg/dL (ref 8.4–10.5)
GFR calc non Af Amer: 80 mL/min — ABNORMAL LOW (ref >90)
Sodium: 140 mEq/L (ref 135–145)

## 2012-09-13 LAB — SURGICAL PCR SCREEN
MRSA, PCR: NEGATIVE
Staphylococcus aureus: NEGATIVE

## 2012-09-13 LAB — TYPE AND SCREEN
ABO/RH(D): O NEG
Antibody Screen: NEGATIVE

## 2012-09-13 NOTE — Pre-Procedure Instructions (Signed)
Heidi [name]  09/13/2012   Your procedure is scheduled on:  Wednesday, May 14th   Report to [facility] at 9:00 AM.             (Come through Entrance "A"--follow hallway to you see signs for "[facility] those to the 3rd Floor)   Call this number if you have problems the morning of surgery: [phone]   Remember:   Do not eat food or drink liquids after midnight Tuesday.   Take these medicines the morning of surgery with A SIP OF WATER: Nasonex, Omeprazole              Stop taking any aspirin, anti-inflammatories, coumadin, plavix, herbal supplements 5 days prior to surgery.   Do not wear jewelry, make-up or nail polish.  Do not wear lotions, powders, or perfumes. You may NOT wear deodorant.  Do not shave underarms & legs 48 hours prior to surgery.    Do not bring valuables to the hospital.  Contacts, dentures or bridgework may not be worn into surgery.   Leave suitcase in the car. After surgery it may be brought to your room.  For patients admitted to the hospital, checkout time is 11:00 AM the day of discharge.   Name and phone number of your driver:    Special Instructions: Shower using CHG 2 nights before surgery and the night before surgery.  If you shower the day of surgery use CHG.  Use special wash - you have one bottle of CHG for all showers.  You should use approximately 1/3 of the bottle for each shower.   Please read over the following fact sheets that you were given: Pain Booklet, Coughing and Deep Breathing, MRSA Information and Surgical Site Infection Prevention

## 2012-09-13 NOTE — Progress Notes (Addendum)
Pt states that more then 5 yrs ago, she had stress test @ Dr. Mamie Nick Jordan's office (i have requested results) for her heart murmur and skipped beats.   I have also requested CXR results from Dr. Jerilynn Mages Perini's office........Marland KitchenDA  ( cxr results In the chart)  Pt is aware of her bowel prep.Marland Kitchenda

## 2012-09-14 ENCOUNTER — Other Ambulatory Visit (HOSPITAL_COMMUNITY): Payer: Medicare Other

## 2012-09-14 MED ORDER — NEOMYCIN SULFATE 500 MG PO TABS
1000.0000 mg | ORAL_TABLET | ORAL | Status: DC
  Filled 2012-09-14 (×2): qty 2

## 2012-09-14 MED ORDER — ERYTHROMYCIN BASE 250 MG PO TABS
1000.0000 mg | ORAL_TABLET | ORAL | Status: DC
  Filled 2012-09-14 (×2): qty 4

## 2012-09-14 MED ORDER — GENTAMICIN SULFATE 40 MG/ML IJ SOLN
5.0000 mg/kg | INTRAVENOUS | Status: AC
  Administered 2012-09-15: 284 mg via INTRAVENOUS
  Filled 2012-09-14: qty 7.1

## 2012-09-14 MED ORDER — CLINDAMYCIN PHOSPHATE 900 MG/50ML IV SOLN
900.0000 mg | INTRAVENOUS | Status: AC
  Administered 2012-09-15: 900 mg via INTRAVENOUS
  Filled 2012-09-14: qty 50

## 2012-09-14 NOTE — Progress Notes (Signed)
This is a 78 year old female who is scheduled for a laparoscopic right sided ileocolectomy secondary to a diagnosis of cecal cancer to be performed by Dr. Stark Klein on 16 Sep 2012. An EKG was performed on 13 Sep 2012 and has been confirmed by cardiology. She has no significant cardiac history. May proceed with surgery as scheduled.

## 2012-09-15 ENCOUNTER — Inpatient Hospital Stay (HOSPITAL_COMMUNITY)
Admission: RE | Admit: 2012-09-15 | Discharge: 2012-09-20 | DRG: 331 | Disposition: A | Discharge status: Home / Self Care | Payer: Medicare Other | Source: Ambulatory Visit | Attending: General Surgery | Admitting: General Surgery

## 2012-09-15 ENCOUNTER — Encounter (HOSPITAL_COMMUNITY)
Admission: RE | Disposition: A | Discharge status: Home / Self Care | Payer: Self-pay | Source: Ambulatory Visit | Attending: General Surgery

## 2012-09-15 ENCOUNTER — Encounter (HOSPITAL_COMMUNITY): Payer: Self-pay | Admitting: Anesthesiology

## 2012-09-15 ENCOUNTER — Inpatient Hospital Stay (HOSPITAL_COMMUNITY): Payer: Medicare Other | Admitting: Anesthesiology

## 2012-09-15 DIAGNOSIS — K745 Biliary cirrhosis, unspecified: Secondary | ICD-10-CM

## 2012-09-15 DIAGNOSIS — K219 Gastro-esophageal reflux disease without esophagitis: Secondary | ICD-10-CM

## 2012-09-15 DIAGNOSIS — K625 Hemorrhage of anus and rectum: Secondary | ICD-10-CM

## 2012-09-15 DIAGNOSIS — C180 Malignant neoplasm of cecum: Principal | ICD-10-CM | POA: Diagnosis present

## 2012-09-15 DIAGNOSIS — H3530 Unspecified macular degeneration: Secondary | ICD-10-CM | POA: Diagnosis present

## 2012-09-15 DIAGNOSIS — H269 Unspecified cataract: Secondary | ICD-10-CM

## 2012-09-15 DIAGNOSIS — K635 Polyp of colon: Secondary | ICD-10-CM

## 2012-09-15 DIAGNOSIS — Z809 Family history of malignant neoplasm, unspecified: Secondary | ICD-10-CM

## 2012-09-15 DIAGNOSIS — M129 Arthropathy, unspecified: Secondary | ICD-10-CM

## 2012-09-15 DIAGNOSIS — C189 Malignant neoplasm of colon, unspecified: Secondary | ICD-10-CM

## 2012-09-15 DIAGNOSIS — K222 Esophageal obstruction: Secondary | ICD-10-CM

## 2012-09-15 DIAGNOSIS — M810 Age-related osteoporosis without current pathological fracture: Secondary | ICD-10-CM | POA: Diagnosis present

## 2012-09-15 DIAGNOSIS — K5730 Diverticulosis of large intestine without perforation or abscess without bleeding: Secondary | ICD-10-CM

## 2012-09-15 DIAGNOSIS — Z87891 Personal history of nicotine dependence: Secondary | ICD-10-CM

## 2012-09-15 DIAGNOSIS — I10 Essential (primary) hypertension: Secondary | ICD-10-CM

## 2012-09-15 DIAGNOSIS — K6389 Other specified diseases of intestine: Secondary | ICD-10-CM

## 2012-09-15 DIAGNOSIS — Z85828 Personal history of other malignant neoplasm of skin: Secondary | ICD-10-CM

## 2012-09-15 DIAGNOSIS — K5190 Ulcerative colitis, unspecified, without complications: Secondary | ICD-10-CM

## 2012-09-15 HISTORY — DX: Other chronic pain: G89.29

## 2012-09-15 HISTORY — PX: LAPAROSCOPIC RIGHT COLON RESECTION: SHX1935

## 2012-09-15 HISTORY — DX: Cardiac arrhythmia, unspecified: I49.9

## 2012-09-15 HISTORY — DX: Pure hypercholesterolemia, unspecified: E78.00

## 2012-09-15 HISTORY — DX: Basal cell carcinoma of skin of other parts of face: C44.319

## 2012-09-15 HISTORY — DX: Low back pain, unspecified: M54.50

## 2012-09-15 HISTORY — DX: Low back pain: M54.5

## 2012-09-15 HISTORY — PX: LAPAROSCOPIC PARTIAL COLECTOMY: SHX5907

## 2012-09-15 LAB — CBC
HCT: 41.7 % (ref 36.0–46.0)
Hemoglobin: 14 g/dL (ref 12.0–15.0)
MCHC: 33.6 g/dL (ref 30.0–36.0)
MCV: 92.7 fL (ref 78.0–100.0)
WBC: 13.2 10*3/uL — ABNORMAL HIGH (ref 4.0–10.5)

## 2012-09-15 LAB — CREATININE, SERUM
GFR calc Af Amer: >90 mL/min (ref >90)
GFR calc non Af Amer: 80 mL/min — ABNORMAL LOW (ref >90)

## 2012-09-15 SURGERY — LAPAROSCOPIC PARTIAL COLECTOMY
Anesthesia: General | Site: Abdomen | Wound class: Clean Contaminated

## 2012-09-15 MED ORDER — KETOROLAC TROMETHAMINE 30 MG/ML IJ SOLN
INTRAMUSCULAR | Status: AC
  Administered 2012-09-15: 15 mg
  Filled 2012-09-15: qty 1

## 2012-09-15 MED ORDER — KETOROLAC TROMETHAMINE 15 MG/ML IJ SOLN
15.0000 mg | Freq: Three times a day (TID) | INTRAMUSCULAR | Status: AC
  Administered 2012-09-15 – 2012-09-16 (×2): 15 mg via INTRAVENOUS
  Filled 2012-09-15: qty 1

## 2012-09-15 MED ORDER — HYDROMORPHONE HCL PF 1 MG/ML IJ SOLN
INTRAMUSCULAR | Status: AC
  Administered 2012-09-15: 0.5 mg via INTRAVENOUS
  Filled 2012-09-15: qty 1

## 2012-09-15 MED ORDER — MORPHINE SULFATE 2 MG/ML IJ SOLN: 1.0000 mg | INTRAMUSCULAR | Status: DC | PRN

## 2012-09-15 MED ORDER — NEOSTIGMINE METHYLSULFATE 1 MG/ML IJ SOLN
INTRAMUSCULAR | Status: DC | PRN
  Administered 2012-09-15: 4 mg via INTRAVENOUS

## 2012-09-15 MED ORDER — 0.9 % SODIUM CHLORIDE (POUR BTL) OPTIME
TOPICAL | Status: DC | PRN
  Administered 2012-09-15 (×2): 1000 mL

## 2012-09-15 MED ORDER — ALUM & MAG HYDROXIDE-SIMETH 200-200-20 MG/5ML PO SUSP: 30.0000 mL | Freq: Four times a day (QID) | ORAL | Status: DC | PRN

## 2012-09-15 MED ORDER — ALVIMOPAN 12 MG PO CAPS
12.0000 mg | ORAL_CAPSULE | Freq: Two times a day (BID) | ORAL | Status: DC
  Administered 2012-09-16 – 2012-09-19 (×8): 12 mg via ORAL
  Filled 2012-09-15 (×11): qty 1

## 2012-09-15 MED ORDER — HYDROMORPHONE HCL PF 1 MG/ML IJ SOLN
0.2500 mg | INTRAMUSCULAR | Status: DC | PRN
  Administered 2012-09-15: 0.5 mg via INTRAVENOUS

## 2012-09-15 MED ORDER — BUPIVACAINE ON-Q PAIN PUMP (FOR ORDER SET NO CHG)
INJECTION | Status: DC
  Filled 2012-09-15: qty 1

## 2012-09-15 MED ORDER — NALOXONE HCL 0.4 MG/ML IJ SOLN: 0.4000 mg | INTRAMUSCULAR | Status: DC | PRN

## 2012-09-15 MED ORDER — ENOXAPARIN SODIUM 40 MG/0.4ML ~~LOC~~ SOLN
40.0000 mg | SUBCUTANEOUS | Status: DC
  Administered 2012-09-16 – 2012-09-20 (×5): 40 mg via SUBCUTANEOUS
  Filled 2012-09-15 (×7): qty 0.4

## 2012-09-15 MED ORDER — CLINDAMYCIN PHOSPHATE 900 MG/50ML IV SOLN
900.0000 mg | Freq: Three times a day (TID) | INTRAVENOUS | Status: AC
  Administered 2012-09-15 – 2012-09-16 (×3): 900 mg via INTRAVENOUS
  Filled 2012-09-15 (×3): qty 50

## 2012-09-15 MED ORDER — LIDOCAINE HCL 1 % IJ SOLN
INTRAMUSCULAR | Status: DC | PRN
  Administered 2012-09-15: 13:00:00 via INTRAMUSCULAR

## 2012-09-15 MED ORDER — KETOROLAC TROMETHAMINE 15 MG/ML IJ SOLN
15.0000 mg | Freq: Two times a day (BID) | INTRAMUSCULAR | Status: DC | PRN
  Administered 2012-09-17 – 2012-09-18 (×3): 15 mg via INTRAVENOUS
  Filled 2012-09-15 (×4): qty 1

## 2012-09-15 MED ORDER — ROCURONIUM BROMIDE 100 MG/10ML IV SOLN
INTRAVENOUS | Status: DC | PRN
  Administered 2012-09-15: 40 mg via INTRAVENOUS

## 2012-09-15 MED ORDER — HYDROCORTISONE ACETATE 25 MG RE SUPP
25.0000 mg | Freq: Two times a day (BID) | RECTAL | Status: DC | PRN
  Filled 2012-09-15: qty 1

## 2012-09-15 MED ORDER — ACETAMINOPHEN 325 MG PO TABS
650.0000 mg | ORAL_TABLET | Freq: Four times a day (QID) | ORAL | Status: DC | PRN
  Administered 2012-09-19 – 2012-09-20 (×4): 650 mg via ORAL
  Filled 2012-09-15 (×3): qty 2

## 2012-09-15 MED ORDER — BUPIVACAINE 0.25 % ON-Q PUMP DUAL CATH 300 ML
300.0000 mL | INJECTION | Status: DC
  Filled 2012-09-15: qty 300

## 2012-09-15 MED ORDER — ONDANSETRON HCL 4 MG/2ML IJ SOLN
INTRAMUSCULAR | Status: AC
  Administered 2012-09-15: 4 mg via INTRAVENOUS
  Filled 2012-09-15: qty 2

## 2012-09-15 MED ORDER — SODIUM CHLORIDE 0.9 % IJ SOLN: 9.0000 mL | INTRAMUSCULAR | Status: DC | PRN

## 2012-09-15 MED ORDER — SODIUM CHLORIDE 0.9 % IR SOLN
Status: DC | PRN
  Administered 2012-09-15: 1000 mL

## 2012-09-15 MED ORDER — MORPHINE SULFATE (PF) 1 MG/ML IV SOLN
INTRAVENOUS | Status: AC
  Filled 2012-09-15: qty 25

## 2012-09-15 MED ORDER — ONDANSETRON HCL 4 MG/2ML IJ SOLN
INTRAMUSCULAR | Status: DC | PRN
  Administered 2012-09-15: 4 mg via INTRAVENOUS

## 2012-09-15 MED ORDER — KCL IN DEXTROSE-NACL 20-5-0.45 MEQ/L-%-% IV SOLN
INTRAVENOUS | Status: AC
  Filled 2012-09-15: qty 1000

## 2012-09-15 MED ORDER — LACTATED RINGERS IV SOLN
INTRAVENOUS | Status: DC | PRN
  Administered 2012-09-15: 12:00:00 via INTRAVENOUS

## 2012-09-15 MED ORDER — ALVIMOPAN 12 MG PO CAPS
12.0000 mg | ORAL_CAPSULE | Freq: Once | ORAL | Status: AC
  Administered 2012-09-15: 12 mg via ORAL
  Filled 2012-09-15: qty 1

## 2012-09-15 MED ORDER — BUPIVACAINE-EPINEPHRINE PF 0.25-1:200000 % IJ SOLN
INTRAMUSCULAR | Status: AC
  Filled 2012-09-15: qty 30

## 2012-09-15 MED ORDER — LIDOCAINE HCL (PF) 1 % IJ SOLN
INTRAMUSCULAR | Status: AC
  Filled 2012-09-15: qty 30

## 2012-09-15 MED ORDER — FENTANYL CITRATE 0.05 MG/ML IJ SOLN
INTRAMUSCULAR | Status: DC | PRN
  Administered 2012-09-15: 100 ug via INTRAVENOUS
  Administered 2012-09-15 (×3): 50 ug via INTRAVENOUS

## 2012-09-15 MED ORDER — MORPHINE SULFATE (PF) 1 MG/ML IV SOLN
INTRAVENOUS | Status: DC
  Administered 2012-09-15: 5 mg via INTRAVENOUS
  Administered 2012-09-15: 7 mg via INTRAVENOUS
  Administered 2012-09-16: 1 mg via INTRAVENOUS
  Administered 2012-09-16: 14:00:00 via INTRAVENOUS
  Administered 2012-09-16: 0.1 mg via INTRAVENOUS
  Administered 2012-09-17: 1 mg via INTRAVENOUS
  Administered 2012-09-17: 2 mg via INTRAVENOUS
  Administered 2012-09-17: 1 mg via INTRAVENOUS
  Administered 2012-09-17 – 2012-09-18 (×2): 2 mg via INTRAVENOUS
  Administered 2012-09-18: 1 mg via INTRAVENOUS
  Filled 2012-09-15 (×2): qty 25

## 2012-09-15 MED ORDER — DIPHENHYDRAMINE HCL 12.5 MG/5ML PO ELIX: 12.5000 mg | ORAL_SOLUTION | Freq: Four times a day (QID) | ORAL | Status: DC | PRN

## 2012-09-15 MED ORDER — KCL IN DEXTROSE-NACL 20-5-0.45 MEQ/L-%-% IV SOLN
INTRAVENOUS | Status: DC
  Administered 2012-09-15 – 2012-09-18 (×7): via INTRAVENOUS
  Filled 2012-09-15 (×8): qty 1000

## 2012-09-15 MED ORDER — ONDANSETRON HCL 4 MG/2ML IJ SOLN: INTRAMUSCULAR | Status: DC | PRN

## 2012-09-15 MED ORDER — PANTOPRAZOLE SODIUM 40 MG PO TBEC
40.0000 mg | DELAYED_RELEASE_TABLET | Freq: Every day | ORAL | Status: DC
  Administered 2012-09-15 – 2012-09-20 (×6): 40 mg via ORAL
  Filled 2012-09-15 (×6): qty 1

## 2012-09-15 MED ORDER — SACCHAROMYCES BOULARDII 250 MG PO CAPS
250.0000 mg | ORAL_CAPSULE | Freq: Two times a day (BID) | ORAL | Status: DC
  Administered 2012-09-15 – 2012-09-20 (×10): 250 mg via ORAL
  Filled 2012-09-15 (×11): qty 1

## 2012-09-15 MED ORDER — GLYCOPYRROLATE 0.2 MG/ML IJ SOLN
INTRAMUSCULAR | Status: DC | PRN
  Administered 2012-09-15: 0.6 mg via INTRAVENOUS

## 2012-09-15 MED ORDER — HYDROMORPHONE HCL PF 1 MG/ML IJ SOLN
INTRAMUSCULAR | Status: AC
  Filled 2012-09-15: qty 1

## 2012-09-15 MED ORDER — DIPHENHYDRAMINE HCL 50 MG/ML IJ SOLN: 12.5000 mg | Freq: Four times a day (QID) | INTRAMUSCULAR | Status: DC | PRN

## 2012-09-15 MED ORDER — LIDOCAINE HCL (CARDIAC) 20 MG/ML IV SOLN
INTRAVENOUS | Status: DC | PRN
  Administered 2012-09-15: 40 mg via INTRAVENOUS

## 2012-09-15 MED ORDER — FLUTICASONE PROPIONATE 50 MCG/ACT NA SUSP
1.0000 | Freq: Every day | NASAL | Status: DC
  Administered 2012-09-15 – 2012-09-19 (×4): 1 via NASAL
  Filled 2012-09-15: qty 16

## 2012-09-15 MED ORDER — PROMETHAZINE HCL 25 MG/ML IJ SOLN: 6.2500 mg | Freq: Four times a day (QID) | INTRAMUSCULAR | Status: DC | PRN

## 2012-09-15 MED ORDER — LACTATED RINGERS IV SOLN
INTRAVENOUS | Status: DC
  Administered 2012-09-15: 11:00:00 via INTRAVENOUS

## 2012-09-15 MED ORDER — ACETAMINOPHEN 10 MG/ML IV SOLN
1000.0000 mg | Freq: Four times a day (QID) | INTRAVENOUS | Status: AC
  Administered 2012-09-15 – 2012-09-16 (×4): 1000 mg via INTRAVENOUS
  Filled 2012-09-15 (×4): qty 100

## 2012-09-15 MED ORDER — OXYCODONE-ACETAMINOPHEN 5-325 MG PO TABS
1.0000 | ORAL_TABLET | ORAL | Status: DC | PRN
  Administered 2012-09-19 (×2): 1 via ORAL
  Filled 2012-09-15: qty 2

## 2012-09-15 MED ORDER — ONDANSETRON HCL 4 MG/2ML IJ SOLN
4.0000 mg | Freq: Four times a day (QID) | INTRAMUSCULAR | Status: DC | PRN
  Administered 2012-09-16 – 2012-09-17 (×4): 4 mg via INTRAVENOUS
  Filled 2012-09-15 (×3): qty 2

## 2012-09-15 SURGICAL SUPPLY — 90 items
APPLIER CLIP ROT 10 11.4 M/L (STAPLE)
BENZOIN TINCTURE PRP APPL 2/3 (GAUZE/BANDAGES/DRESSINGS) ×2 IMPLANT
BLADE SURG 10 STRL SS (BLADE) ×2 IMPLANT
BLADE SURG ROTATE 9660 (MISCELLANEOUS) IMPLANT
CANISTER SUCTION 2500CC (MISCELLANEOUS) ×2 IMPLANT
CATH KIT ON Q 5IN SLV (PAIN MANAGEMENT) ×4 IMPLANT
CATH KIT ON Q 7.5IN SLV (PAIN MANAGEMENT) ×4 IMPLANT
CELLS DAT CNTRL 66122 CELL SVR (MISCELLANEOUS) ×1 IMPLANT
CHLORAPREP W/TINT 26ML (MISCELLANEOUS) ×2 IMPLANT
CLIP APPLIE ROT 10 11.4 M/L (STAPLE) IMPLANT
CLOTH BEACON ORANGE TIMEOUT ST (SAFETY) ×2 IMPLANT
COVER MAYO STAND STRL (DRAPES) ×2 IMPLANT
COVER SURGICAL LIGHT HANDLE (MISCELLANEOUS) ×2 IMPLANT
DECANTER SPIKE VIAL GLASS SM (MISCELLANEOUS) IMPLANT
DRAPE PROXIMA HALF (DRAPES) IMPLANT
DRAPE UTILITY 15X26 W/TAPE STR (DRAPE) ×6 IMPLANT
DRAPE WARM FLUID 44X44 (DRAPE) ×2 IMPLANT
DRSG OPSITE POSTOP 4X6 (GAUZE/BANDAGES/DRESSINGS) ×2 IMPLANT
ELECT CAUTERY BLADE 6.4 (BLADE) ×4 IMPLANT
ELECT REM PT RETURN 9FT ADLT (ELECTROSURGICAL) ×2
ELECTRODE REM PT RTRN 9FT ADLT (ELECTROSURGICAL) ×1 IMPLANT
GAUZE SPONGE 2X2 8PLY STRL LF (GAUZE/BANDAGES/DRESSINGS) ×1 IMPLANT
GEL ULTRASOUND 20GR AQUASONIC (MISCELLANEOUS) IMPLANT
GLOVE BIO SURGEON STRL SZ 6 (GLOVE) ×4 IMPLANT
GLOVE BIO SURGEON STRL SZ 6.5 (GLOVE) ×2 IMPLANT
GLOVE BIO SURGEON STRL SZ7.5 (GLOVE) ×2 IMPLANT
GLOVE BIOGEL PI IND STRL 6.5 (GLOVE) ×2 IMPLANT
GLOVE BIOGEL PI IND STRL 7.5 (GLOVE) ×1 IMPLANT
GLOVE BIOGEL PI INDICATOR 6.5 (GLOVE) ×2
GLOVE BIOGEL PI INDICATOR 7.5 (GLOVE) ×1
GLOVE ECLIPSE 8.0 STRL XLNG CF (GLOVE) ×2 IMPLANT
GLOVE SURG SIGNA 7.5 PF LTX (GLOVE) ×4 IMPLANT
GLOVE SURG SS PI 7.0 STRL IVOR (GLOVE) ×2 IMPLANT
GOWN PREVENTION PLUS XLARGE (GOWN DISPOSABLE) ×4 IMPLANT
GOWN PREVENTION PLUS XXLARGE (GOWN DISPOSABLE) ×4 IMPLANT
GOWN STRL NON-REIN LRG LVL3 (GOWN DISPOSABLE) ×4 IMPLANT
KIT BASIN OR (CUSTOM PROCEDURE TRAY) ×2 IMPLANT
KIT ROOM TURNOVER OR (KITS) ×2 IMPLANT
LEGGING LITHOTOMY PAIR STRL (DRAPES) IMPLANT
LIGASURE 5MM LAPAROSCOPIC (INSTRUMENTS) IMPLANT
LIGASURE IMPACT 36 18CM CVD LR (INSTRUMENTS) IMPLANT
NS IRRIG 1000ML POUR BTL (IV SOLUTION) ×4 IMPLANT
PAD ARMBOARD 7.5X6 YLW CONV (MISCELLANEOUS) ×4 IMPLANT
PAD SHARPS MAGNETIC DISPOSAL (MISCELLANEOUS) IMPLANT
PENCIL BUTTON HOLSTER BLD 10FT (ELECTRODE) ×2 IMPLANT
RTRCTR WOUND ALEXIS 18CM MED (MISCELLANEOUS) ×2
SCALPEL HARMONIC ACE (MISCELLANEOUS) IMPLANT
SCISSORS LAP 5X35 DISP (ENDOMECHANICALS) IMPLANT
SEALER TISSUE G2 STRG ARTC 35C (ENDOMECHANICALS) ×2 IMPLANT
SET IRRIG TUBING LAPAROSCOPIC (IRRIGATION / IRRIGATOR) ×2 IMPLANT
SLEEVE ENDOPATH XCEL 5M (ENDOMECHANICALS) ×4 IMPLANT
SPECIMEN JAR LARGE (MISCELLANEOUS) ×2 IMPLANT
SPONGE GAUZE 2X2 STER 10/PKG (GAUZE/BANDAGES/DRESSINGS) ×1
SPONGE LAP 18X18 X RAY DECT (DISPOSABLE) IMPLANT
STAPLER GUN LINEAR PROX 60 (STAPLE) ×2 IMPLANT
STAPLER VISISTAT 35W (STAPLE) ×2 IMPLANT
SUCTION POOLE TIP (SUCTIONS) ×2 IMPLANT
SURGILUBE 2OZ TUBE FLIPTOP (MISCELLANEOUS) IMPLANT
SUT MNCRL AB 4-0 PS2 18 (SUTURE) ×2 IMPLANT
SUT PDS AB 1 CT  36 (SUTURE)
SUT PDS AB 1 CT 36 (SUTURE) IMPLANT
SUT PDS II 0 TP-1 LOOPED 60 (SUTURE) ×4 IMPLANT
SUT PROLENE 2 0 CT2 30 (SUTURE) IMPLANT
SUT PROLENE 2 0 KS (SUTURE) IMPLANT
SUT SILK 2 0 (SUTURE)
SUT SILK 2 0 SH CR/8 (SUTURE) IMPLANT
SUT SILK 2-0 18XBRD TIE 12 (SUTURE) IMPLANT
SUT SILK 3 0 (SUTURE)
SUT SILK 3 0 SH CR/8 (SUTURE) IMPLANT
SUT SILK 3-0 18XBRD TIE 12 (SUTURE) IMPLANT
SUT VIC AB 2-0 SH 18 (SUTURE) ×2 IMPLANT
SUT VIC AB 3-0 SH 8-18 (SUTURE) ×2 IMPLANT
SUT VICRYL AB 2 0 TIES (SUTURE) ×2 IMPLANT
SUT VICRYL AB 3 0 TIES (SUTURE) ×2 IMPLANT
SYR BULB IRRIGATION 50ML (SYRINGE) IMPLANT
SYS LAPSCP GELPORT 120MM (MISCELLANEOUS)
SYSTEM LAPSCP GELPORT 120MM (MISCELLANEOUS) IMPLANT
TOWEL OR 17X26 10 PK STRL BLUE (TOWEL DISPOSABLE) ×4 IMPLANT
TRAY FOLEY CATH 14FRSI W/METER (CATHETERS) ×2 IMPLANT
TRAY LAPAROSCOPIC (CUSTOM PROCEDURE TRAY) ×2 IMPLANT
TRAY PROCTOSCOPIC FIBER OPTIC (SET/KITS/TRAYS/PACK) IMPLANT
TROCAR XCEL 12X100 BLDLESS (ENDOMECHANICALS) IMPLANT
TROCAR XCEL BLUNT TIP 100MML (ENDOMECHANICALS) IMPLANT
TROCAR XCEL NON-BLD 11X100MML (ENDOMECHANICALS) IMPLANT
TROCAR XCEL NON-BLD 5MMX100MML (ENDOMECHANICALS) ×2 IMPLANT
TUBE CONNECTING 12X1/4 (SUCTIONS) ×2 IMPLANT
TUBING FILTER THERMOFLATOR (ELECTROSURGICAL) ×2 IMPLANT
TUNNELER SHEATH ON-Q 16GX12 DP (PAIN MANAGEMENT) ×2 IMPLANT
WATER STERILE IRR 1000ML POUR (IV SOLUTION) IMPLANT
YANKAUER SUCT BULB TIP NO VENT (SUCTIONS) ×4 IMPLANT

## 2012-09-15 NOTE — H&P (View-Only) (Signed)
Chief Complaint  Patient presents with  . New Evaluation    eval cecal mass    HISTORY: Patient is a 78 year old female that Dr. Deatra Ina has been following for ulcerative colitis. She has only been on mesalamine for a while. She had not had a flare for many years until this year. She started having some increased diarrhea.  Dr. Deatra Ina performed colonoscopy and found a cecal polyp with high-grade dysplasia. She had some minimal erythema of the left side of the colon and rectum. There was no dysplasia on this side. She denies nausea or vomiting. She has not had any weight loss. 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 She states that she was actually diagnosed with PBC first. She does not have any history of colon cancer, breast cancer, prostate cancer, ovarian cancer in her family. She has had some rectal bleeding. She drinks wine daily generally.  Past Medical History  Diagnosis Date  . Arthritis   . Cataract     Left Eye  . Diverticulosis of colon   . Ulcerative colitis   . GERD (gastroesophageal reflux disease)   . Allergy   . Cancer     skin CA  . Macular degeneration   . Heart murmur   . Osteoporosis     osteopenia  . Ulcerative colitis   . Cirrhosis, primary biliary     Past Surgical History  Procedure Laterality Date  . Tubal ligation    . Tonsillectomy    . Knee arthroscopy    . Cataracts      both eyes  . Colonoscopy      Current Outpatient Prescriptions  Medication Sig Dispense Refill  . aspirin 81 MG tablet Take 81 mg by mouth daily.        . calcium carbonate (OS-CAL) 600 MG TABS Take 600 mg by mouth daily.       . Coenzyme Q10 (COQ-10) 200 MG CAPS Take 1 capsule by mouth daily.      . cyanocobalamin 500 MCG tablet Take 500 mcg by mouth daily.       . mometasone (NASONEX) 50 MCG/ACT nasal spray Place 2 sprays into the nose daily as needed. Allergies.      . Multiple Vitamins-Minerals (PRESERVISION AREDS PO) Take 1 tablet by  mouth 2 (two) times daily.      . Pitavastatin Calcium (LIVALO) 4 MG TABS Take 0.5 tablets by mouth 2 (two) times a week.       . ezetimibe (ZETIA) 10 MG tablet Take 5 mg by mouth 2 (two) times a week.      . hydrocortisone (ANUSOL-HC) 25 MG suppository Place 1 suppository (25 mg total) rectally every 12 (twelve) hours.  12 suppository  1  . mesalamine (LIALDA) 1.2 G EC tablet Take 2,400 mg by mouth daily with breakfast.      . omeprazole (PRILOSEC) 20 MG capsule Take 20 mg by mouth daily.      . ursodiol (ACTIGALL) 300 MG capsule Take 1 capsule (300 mg total) by mouth 2 (two) times daily.  180 capsule  4   No current facility-administered medications for this visit.     Allergies  Allergen Reactions  . Codeine Nausea And Vomiting  . Penicillins Hives     Family History  Problem Relation Age of Onset  . Breast cancer Paternal Aunt   . Colon cancer Paternal Grandmother   . Heart disease Mother   . Heart disease Brother   .  Stroke Father   . Cancer Brother     lung     History   Social History  . Marital Status: Widowed    Spouse Name: N/A    Number of Children: 3  . Years of Education: N/A   Occupational History  . Compliance     insurance  .     Social History Main Topics  . Smoking status: Former Smoker -- 0.50 packs/day for 45 years    Types: Cigarettes    Quit date: 01/16/2012  . Smokeless tobacco: Never Used  . Alcohol Use: 4.2 oz/week    7 Glasses of wine per week  . Drug Use: No  . Sexually Active: None   Other Topics Concern  . None   Social History Narrative  . None     REVIEW OF SYSTEMS - PERTINENT POSITIVES ONLY: 12 point review of systems negative other than HPI and PMH except for rectal bleeding.    EXAM: Filed Vitals:   09/07/12 1500  Pulse: 72  Temp: 97.7 F (36.5 C)  Resp: 16    Gen:  No acute distress.  Well nourished and well groomed.   Neurological: Alert and oriented to person, place, and time. Coordination normal.  Head:  Normocephalic and atraumatic.  Eyes: Conjunctivae are normal. Pupils are equal, round, and reactive to light. No scleral icterus.  Neck: Normal range of motion. Neck supple. No tracheal deviation or thyromegaly present.  Cardiovascular: Normal rate, regular rhythm, normal heart sounds and intact distal pulses.  Exam reveals no gallop and no friction rub.  No murmur heard. Respiratory: Effort normal.  No respiratory distress. No chest wall tenderness. Breath sounds normal.  No wheezes, rales or rhonchi.  GI: Soft. Bowel sounds are normal. The abdomen is soft and nontender.  There is no rebound and no guarding.  Small periumbilical tubal ligation incision. Musculoskeletal: Normal range of motion. Extremities are nontender.  Lymphadenopathy: No cervical, preauricular, postauricular or axillary adenopathy is present Skin: Skin is warm and dry. No rash noted. No diaphoresis. No erythema. No pallor. No clubbing, cyanosis, or edema.   Psychiatric: Normal mood and affect. Behavior is normal. Judgment and thought content normal.    LABORATORY RESULTS: Available labs are reviewed  Pathology 1. Surgical [P], cecal mass - SUPERFICIAL FRAGMENTS OF HIGH GRADE GLANDULAR DYSPLASIA WITH AREAS SUSPICIOUS FOR INVASION, SEE COMMENT. 2. Surgical [P], transverse - TUBULAR ADENOMA (X1). - NO HIGH GRADE DYSPLASIA OR MALIGNANCY IDENTIFIED. 3. Surgical [P], left side colon - BENIGN COLORECTAL MUCOSA. - NO EVIDENCE OF ACTIVE INFLAMMATION, DYSPLASIA OR MALIGNANCY IDENTIFIED.  RADIOLOGY RESULTS: See E-Chart or I-Site for most recent results.  Images and reports are reviewed. No pertinent imaging.     ASSESSMENT AND PLAN: Cecal polyp with high grade dysplasia Patient does have a long history of ulcerative colitis. However, she has never had any dysplasia found in her colon other than this polyp in the cecum. Given her age and her propensity toward diarrhea, we will plan to just do a right sided  hemicolectomy.  She would be a very poor candidate for a total colectomy with ileal pouch anal anastomosis. She has frequent surveillance with colonoscopy.  I will plan to do this laparoscopically. We did review the surgery and risks. I advised the patient that we would have a few port sites and then a small incision to create a connection between the terminal ileum and the transverse colon. She is in agreement to proceed. I reviewed the risk of anastomotic  leak, bleeding, infection, possible ostomy, blood clot, pneumonia, urinary infection, damage to adjacent structures, need for additional surgeries or procedures. We will schedule this at the first available opportunity.  I advised the patient that prednisone increases the risk of anastomotic leak, wound infection, and other infectious issues.   Milus Height MD Surgical Oncology, General and Endocrine Surgery [facility]   30 minutes were spent in counseling in addition to the examination.     Visit Diagnoses: 1. Cecal cancer     Primary Care Physician: Jerlyn Ly, MD

## 2012-09-15 NOTE — Transfer of Care (Signed)
Immediate Anesthesia Transfer of Care Note  Patient: Heidi [name]  Procedure(s) Performed: Procedure(s): LAPAROSCOPIC ILEOCOLECTOMY (N/A)  Patient Location: PACU  Anesthesia Type:General  Level of Consciousness: awake, alert , oriented and sedated  Airway & Oxygen Therapy: Patient Spontanous Breathing and Patient connected to nasal cannula oxygen  Post-op Assessment: Report given to PACU RN, Post -op Vital signs reviewed and stable and Patient moving all extremities  Post vital signs: Reviewed and stable  Complications: No apparent anesthesia complications

## 2012-09-15 NOTE — Interval H&P Note (Signed)
History and Physical Interval Note:  09/15/2012 11:07 AM  Heidi [name]  has presented today for surgery, with the diagnosis of cecal polyp  The various methods of treatment have been discussed with the patient and family. After consideration of risks, benefits and other options for treatment, the patient has consented to  Procedure(s): LAPAROSCOPIC ILEOCOLECTOMY (N/A) as a surgical intervention .  The patient's history has been reviewed, patient examined, no change in status, stable for surgery.  I have reviewed the patient's chart and labs.  Questions were answered to the patient's satisfaction.     Heidi [name]

## 2012-09-15 NOTE — Anesthesia Postprocedure Evaluation (Signed)
  Anesthesia Post-op Note  Patient: Heidi [name]  Procedure(s) Performed: Procedure(s): LAPAROSCOPIC ILEOCOLECTOMY (N/A)  Patient Location: PACU  Anesthesia Type:General  Level of Consciousness: awake and alert   Airway and Oxygen Therapy: Patient Spontanous Breathing and Patient connected to nasal cannula oxygen  Post-op Pain: mild  Post-op Assessment: Post-op Vital signs reviewed, Patient's Cardiovascular Status Stable, Respiratory Function Stable, Patent Airway and No signs of Nausea or vomiting  Post-op Vital Signs: Reviewed and stable  Complications: No apparent anesthesia complications

## 2012-09-15 NOTE — Anesthesia Preprocedure Evaluation (Addendum)
Anesthesia Evaluation  Patient identified by MRN, date of birth, ID band Patient awake    Reviewed: Allergy & Precautions, H&P , NPO status , Patient's Chart, lab work & pertinent test results  Airway Mallampati: II TM Distance: >3 FB Neck ROM: Full    Dental no notable dental hx. (+) Teeth Intact and Dental Advisory Given   Pulmonary neg pulmonary ROS,  breath sounds clear to auscultation  Pulmonary exam normal       Cardiovascular hypertension, + Valvular Problems/Murmurs Rhythm:Regular Rate:Normal     Neuro/Psych negative neurological ROS  negative psych ROS   GI/Hepatic Neg liver ROS, PUD, GERD-  Medicated and Controlled,  Endo/Other  negative endocrine ROS  Renal/GU negative Renal ROS  negative genitourinary   Musculoskeletal   Abdominal   Peds  Hematology negative hematology ROS (+)   Anesthesia Other Findings   Reproductive/Obstetrics negative OB ROS                          Anesthesia Physical Anesthesia Plan  ASA: II  Anesthesia Plan: General   Post-op Pain Management:    Induction: Intravenous  Airway Management Planned: Oral ETT  Additional Equipment:   Intra-op Plan:   Post-operative Plan: Extubation in OR  Informed Consent: I have reviewed the patients History and Physical, chart, labs and discussed the procedure including the risks, benefits and alternatives for the proposed anesthesia with the patient or authorized representative who has indicated his/her understanding and acceptance.   Dental advisory given  Plan Discussed with: CRNA  Anesthesia Plan Comments:         Anesthesia Quick Evaluation

## 2012-09-15 NOTE — Op Note (Signed)
Laparoscopic Right Hemicolectomy Procedure Note  Indications: This patient presents for a laparoscopic partial colectomy for dysplastic cecal polyp incompletely removed by colonoscopy  Pre-operative Diagnosis:  See above  Post-operative Diagnosis:  Same  Surgeon: Stark Klein   Assistants: Coralie Keens  Anesthesia: General endotracheal anesthesia and Local anesthesia 1% buffered lidocaine, 0.25.% bupivacaine, with epinephrine  ASA Class: 2  Procedure Details  The patient was seen in the Holding Room. The risks, benefits, complications, treatment options, and expected outcomes were discussed with the patient. The possibilities of reaction to medication, perforation of viscus, bleeding, recurrent infection, finding a normal colon, the need for additional procedures, failure to diagnose a condition, and creating a complication requiring transfusion or operation were discussed with the patient. The patient was advised of the risk of ostomy.  The patient concurred with the proposed plan, giving informed consent.   The patient was taken to the operating room, identified, and the procedure verified as partial colectomy. A Time Out was held and the above information confirmed.  The patient was brought to the operating room and placed supine. After induction of a general anesthetic, a Foley catheter was inserted and the abdomen was prepped and draped in standard fashion. The patient was placed into reverse the lumbar position and rotated to the right. A small incision at the costal margin was made with a #11 blade. Local anesthetic was introduced.  The 5 mm Optiview trocar was placed under direct visualization.  Pneumoperitoneum was insufflated to a pressure of 15 mm Hg. The laparoscope was introduced.    Exploration revealed a normal omentum, colon, small bowel, peritoneum, liver, and stomach. Two 5 mm left sided 5-mm trocars were then placed after anesthetizing the skin and peritoneum with local  anesthetic. The ascending colon and hepatic flexure were then mobilized with gentle retraction of the colon in a medial direction with mobilization of the peritoneal reflection with cautery and the EnSeal.  The omentum was taken off the transverse colon. Mobilization of this area was complete to expose the retroperitoneum.  There was minimal blood loss during this portion of the procedure.      The colon was evaluated to make sure it was adequately mobilized.  A 5 cm vertical incision was made above the umbilicus.  The subcutaneous tissues were divided with the cautery.  The fascia was the cautery as well. The fascial incision was carried out point the skin incision. The protractor was placed and the abdomen to protect the skin. The colon was delivered through the incision.  The colon was resected with a linear stapling device proximal and distal to the area in question in regard to the specimen. The mesenteric vessels were clamped and ligated. The tissues were extremely friable and several areas required suture ligation.The specimen was submitted to pathology.      An side-to-side, functional end-to-end anastomosis was performed through the small anterior incision with the linear stapling device. The mucosa was inspected and found to be hemostatic. Closure was achieved with the linear stapling device. A 3-0 vicryl suture was used to reapproximate the angle of the anastomosis. Hemostasis was confirmed. The bowel anastomosis was returned.  The abdomen was irrigated.    The OnQ tunneler was placed on both sides of the fascial incision. The fascial incision was then closed with a running 0 looped PDS suture.  The abdomen was reinsufflated and no bleeding or succus was seen.  Four quadrant inspection was negative for other gross pathology. The incisions were closed with 4-0 monocryl.  The OnQ catheters were advanced through the wound.  Soft dressings were applied.  Instrument, sponge, and needle counts were  correct prior to abdominal closure and at the conclusion of the case.   Findings: palpable polyp in cecum  Estimated Blood Loss: min         Drains: none  Specimens: right colon            Complications: None; patient tolerated the procedure well.         Disposition: PACU - hemodynamically stable.

## 2012-09-15 NOTE — Preoperative (Signed)
Beta Blockers   Reason not to administer Beta Blockers:Not Applicable 

## 2012-09-16 LAB — BASIC METABOLIC PANEL
CO2: 27 mEq/L (ref 19–32)
Glucose, Bld: 132 mg/dL — ABNORMAL HIGH (ref 70–99)
Potassium: 4.4 mEq/L (ref 3.5–5.1)
Sodium: 135 mEq/L (ref 135–145)

## 2012-09-16 LAB — PHOSPHORUS: Phosphorus: 3.8 mg/dL (ref 2.3–4.6)

## 2012-09-16 LAB — CBC
Hemoglobin: 12.2 g/dL (ref 12.0–15.0)
RBC: 3.89 MIL/uL (ref 3.87–5.11)

## 2012-09-16 NOTE — Evaluation (Signed)
Physical Therapy Evaluation Patient Details Name: Heidi [name] MRN: [id number] DOB: 1934-10-02 Today's Date: 09/16/2012 Time: 2202-5427 PT Time Calculation (min): 24 min  PT Assessment / Plan / Recommendation Clinical Impression  Pt is a 78 yo female s/p Laparoscopic ileocolectomy who presents with decreased mobility and independence. Pt would benefit from skilled PT to increase mobility and independence for return home. Anticipate pt will make good progress and will have her daughter assisting as needed at home. Recommend d/c home with no follow-up therapy.    PT Assessment  Patient needs continued PT services    Follow Up Recommendations  No PT follow up    Does the patient have the potential to tolerate intense rehabilitation      Barriers to Discharge        Equipment Recommendations  None recommended by PT    Recommendations for Other Services     Frequency Min 3X/week    Precautions / Restrictions Restrictions Weight Bearing Restrictions: No   Pertinent Vitals/Pain       Mobility  Bed Mobility Bed Mobility: Supine to Sit Supine to Sit: 6: Modified independent (Device/Increase time);With rails;HOB elevated Transfers Transfers: Sit to Stand;Stand to Sit Sit to Stand: 5: Supervision Stand to Sit: 5: Supervision Details for Transfer Assistance: cues for hand placement with RW Ambulation/Gait Ambulation/Gait Assistance: 5: Supervision Ambulation Distance (Feet): 90 Feet Assistive device: Rolling walker Ambulation/Gait Assistance Details: O2 dropped in the 70s with gait on RA. Pt was able to recover in the 90s with pursed lip breathing after 1 minute on RA.  Gait Pattern: Step-through pattern;Decreased stride length Gait velocity: decreased Stairs: No    Exercises     PT Diagnosis: Difficulty walking  PT Problem List: Decreased activity tolerance;Decreased mobility PT Treatment Interventions: DME instruction;Gait training;Therapeutic activities;Therapeutic  exercise;Patient/family education   PT Goals Acute Rehab PT Goals PT Goal Formulation: With patient Time For Goal Achievement: 09/23/12 Potential to Achieve Goals: Good Pt will go Sit to Stand: with modified independence PT Goal: Sit to Stand - Progress: Goal set today Pt will go Stand to Sit: with modified independence PT Goal: Stand to Sit - Progress: Goal set today Pt will Transfer Bed to Chair/Chair to Bed: with modified independence PT Transfer Goal: Bed to Chair/Chair to Bed - Progress: Goal set today Pt will Ambulate: >150 feet;with modified independence;with least restrictive assistive device PT Goal: Ambulate - Progress: Goal set today  Visit Information  Last PT Received On: 09/16/12 Assistance Needed: +1    Subjective Data  Subjective: I am ready to get up. Patient Stated Goal: To return to work.   Prior Functioning  Home Living Lives With: Alone Available Help at Discharge: Friend(s);Available PRN/intermittently Type of Home: House Home Layout: One level Home Adaptive Equipment: None Prior Function Level of Independence: Independent Able to Take Stairs?: Yes Driving: Yes Vocation: Full time employment Communication Communication: No difficulties    Cognition  Cognition Arousal/Alertness: Awake/alert Behavior During Therapy: WFL for tasks assessed/performed Overall Cognitive Status: Within Functional Limits for tasks assessed    Extremity/Trunk Assessment Right Lower Extremity Assessment RLE ROM/Strength/Tone: Within functional levels Left Lower Extremity Assessment LLE ROM/Strength/Tone: Within functional levels Trunk Assessment Trunk Assessment: Normal   Balance Balance Balance Assessed: Yes Static Standing Balance Static Standing - Balance Support: Bilateral upper extremity supported Static Standing - Level of Assistance: 5: Stand by assistance  End of Session PT - End of Session Equipment Utilized During Treatment: Gait belt Activity Tolerance:  Patient tolerated treatment well Patient left:  in bed;with call bell/phone within reach;with family/visitor present Nurse Communication: Mobility status  GP     Heidi [name] 09/16/2012, 10:31 AM

## 2012-09-16 NOTE — Progress Notes (Signed)
1 Day Post-Op  Subjective: No complaints.  No flatus  Objective: Vital signs in last 24 hours: Temp:  [97.1 F (36.2 C)-98.1 F (36.7 C)] 97.5 F (36.4 C) (05/15 0654) Pulse Rate:  [60-79] 63 (05/15 0654) Resp:  [10-22] 22 (05/15 0654) BP: (97-182)/(56-98) 120/61 mmHg (05/15 0654) SpO2:  [98 %-100 %] 100 % (05/15 0654) Weight:  [127 lb 3.3 oz (57.7 kg)] 127 lb 3.3 oz (57.7 kg) (05/14 1650) Last BM Date: 09/15/12  Intake/Output from previous day: 05/14 0701 - 05/15 0700 In: 2098.3 [I.V.:2098.3] Out: 375 [Urine:375] Intake/Output this shift:    General appearance: alert, cooperative and no distress Resp: breathing comfortably Cardio: regular rate and rhythm GI: soft, non tender, sl distended.  dressing/onQ in place Extremities: extremities normal, atraumatic, no cyanosis or edema  Lab Results:   Recent Labs  09/15/12 1829 09/16/12 0425  WBC 13.2* 7.4  HGB 14.0 12.2  HCT 41.7 36.2  PLT 175 195   BMET  Recent Labs  09/13/12 0828 09/15/12 1829 09/16/12 0425  NA 140  --  135  K 4.1  --  4.4  CL 104  --  103  CO2 29  --  27  GLUCOSE 93  --  132*  BUN 17  --  10  CREATININE 0.77 0.76 0.86  CALCIUM 9.5  --  8.0*   PT/INR  Recent Labs  09/13/12 0828  LABPROT 12.3  INR 0.92   ABG No results found for this basename: PHART, PCO2, PO2, HCO3,  in the last 72 hours  Studies/Results: No results found.  Anti-infectives: Anti-infectives   Start     Dose/Rate Route Frequency Ordered Stop   09/15/12 1645  clindamycin (CLEOCIN) IVPB 900 mg     900 mg 100 mL/hr over 30 Minutes Intravenous 3 times per day 09/15/12 1641 09/16/12 0541   09/15/12 0600  clindamycin (CLEOCIN) IVPB 900 mg     900 mg 100 mL/hr over 30 Minutes Intravenous On call to O.R. 09/14/12 1429 09/15/12 1245   09/15/12 0600  gentamicin (GARAMYCIN) 284 mg in dextrose 5 % 100 mL IVPB     5 mg/kg  56.8 kg 107.1 mL/hr over 60 Minutes Intravenous On call to O.R. 09/14/12 1429 09/15/12 1300   09/14/12 1429  neomycin (MYCIFRADIN) tablet 1,000 mg  Status:  Discontinued     1,000 mg Oral 3 times per day 09/14/12 1429 09/15/12 1641   09/14/12 1429  erythromycin (E-MYCIN) tablet 1,000 mg  Status:  Discontinued     1,000 mg Oral 3 times per day 09/14/12 1429 09/15/12 1641      Assessment/Plan: s/p Procedure(s): LAPAROSCOPIC ILEOCOLECTOMY (N/A) Continue foley due to strict I&O Plan d/c foley in AM AMbulate IS Entereg Await return of bowel function.    LOS: 1 day    [facility] 09/16/2012

## 2012-09-17 ENCOUNTER — Encounter (HOSPITAL_COMMUNITY): Payer: Self-pay | Admitting: General Surgery

## 2012-09-17 LAB — CBC
HCT: 34.9 % — ABNORMAL LOW (ref 36.0–46.0)
Hemoglobin: 12.1 g/dL (ref 12.0–15.0)
RBC: 3.86 MIL/uL — ABNORMAL LOW (ref 3.87–5.11)
WBC: 11.9 10*3/uL — ABNORMAL HIGH (ref 4.0–10.5)

## 2012-09-17 LAB — BASIC METABOLIC PANEL
BUN: 4 mg/dL — ABNORMAL LOW (ref 6–23)
Chloride: 97 mEq/L (ref 96–112)
GFR calc Af Amer: >90 mL/min (ref >90)
Glucose, Bld: 130 mg/dL — ABNORMAL HIGH (ref 70–99)
Potassium: 4.1 mEq/L (ref 3.5–5.1)

## 2012-09-17 MED ORDER — BIOTENE DRY MOUTH MT LIQD
15.0000 mL | Freq: Two times a day (BID) | OROMUCOSAL | Status: DC
  Administered 2012-09-17 – 2012-09-19 (×4): 15 mL via OROMUCOSAL

## 2012-09-17 NOTE — Progress Notes (Signed)
Patient ID: Heidi [name], female   DOB: 10/16/1934, 78 y.o.   MRN: [id number] 2 Days Post-Op  Subjective: Complains of belching and nausea.  Objective: Vital signs in last 24 hours: Temp:  [97.6 F (36.4 C)-98.1 F (36.7 C)] 98.1 F (36.7 C) (05/16 0606) Pulse Rate:  [56-88] 69 (05/16 0606) Resp:  [12-17] 15 (05/16 0606) BP: (130-168)/(60-78) 168/72 mmHg (05/16 0606) SpO2:  [9 %-100 %] 98 % (05/16 0647) Last BM Date: 09/15/12  Intake/Output from previous day: 05/15 0701 - 05/16 0700 In: 2781.7 [I.V.:2381.7; IV Piggyback:400] Out: 2550 [Urine:2550] Intake/Output this shift:    General appearance: alert, cooperative and no distress Resp: breathing comfortably Cardio: regular rate and rhythm GI: soft, non tender, sl distended.  dressing/onQ in place Extremities: extremities normal, atraumatic, no cyanosis or edema  Lab Results:   Recent Labs  09/16/12 0425 09/17/12 0500  WBC 7.4 11.9*  HGB 12.2 12.1  HCT 36.2 34.9*  PLT 195 186   BMET  Recent Labs  09/16/12 0425 09/17/12 0500  NA 135 129*  K 4.4 4.1  CL 103 97  CO2 27 26  GLUCOSE 132* 130*  BUN 10 4*  CREATININE 0.86 0.58  CALCIUM 8.0* 8.7   PT/INR No results found for this basename: LABPROT, INR,  in the last 72 hours ABG No results found for this basename: PHART, PCO2, PO2, HCO3,  in the last 72 hours  Studies/Results: No results found.  Anti-infectives: Anti-infectives   Start     Dose/Rate Route Frequency Ordered Stop   09/15/12 1645  clindamycin (CLEOCIN) IVPB 900 mg     900 mg 100 mL/hr over 30 Minutes Intravenous 3 times per day 09/15/12 1641 09/16/12 0541   09/15/12 0600  clindamycin (CLEOCIN) IVPB 900 mg     900 mg 100 mL/hr over 30 Minutes Intravenous On call to O.R. 09/14/12 1429 09/15/12 1245   09/15/12 0600  gentamicin (GARAMYCIN) 284 mg in dextrose 5 % 100 mL IVPB     5 mg/kg  56.8 kg 107.1 mL/hr over 60 Minutes Intravenous On call to O.R. 09/14/12 1429 09/15/12 1300   09/14/12  1429  neomycin (MYCIFRADIN) tablet 1,000 mg  Status:  Discontinued     1,000 mg Oral 3 times per day 09/14/12 1429 09/15/12 1641   09/14/12 1429  erythromycin (E-MYCIN) tablet 1,000 mg  Status:  Discontinued     1,000 mg Oral 3 times per day 09/14/12 1429 09/15/12 1641      Assessment/Plan: s/p Procedure(s): LAPAROSCOPIC ILEOCOLECTOMY (N/A) Continue NPO with ice chips only.   Ambulate IS Entereg Await return of bowel function.    LOS: 2 days    [facility] 09/17/2012

## 2012-09-17 NOTE — Progress Notes (Signed)
Physical Therapy Treatment Patient Details Name: MARCA GADSBY MRN: [id number] DOB: 10-24-1934 Today's Date: 09/17/2012 Time: 1450-1501 PT Time Calculation (min): 11 min  PT Assessment / Plan / Recommendation Comments on Treatment Session  pt progressing well. pt and daugther feel pt is amb more towards baseline today. Encouraged pt to amb over the weekend with nursing. Will f/u with pt on monday to ensure return to PLOF and increase mobility.     Follow Up Recommendations  No PT follow up     Does the patient have the potential to tolerate intense rehabilitation     Barriers to Discharge        Equipment Recommendations  None recommended by PT    Recommendations for Other Services    Frequency Min 3X/week   Plan Discharge plan remains appropriate;Frequency remains appropriate    Precautions / Restrictions Precautions Precautions: None   Pertinent Vitals/Pain 2/10; using morphine PCA.    Mobility  Bed Mobility Bed Mobility: Supine to Sit Supine to Sit: 6: Modified independent (Device/Increase time);With rails;HOB elevated Transfers Transfers: Sit to Stand;Stand to Sit Sit to Stand: 5: Supervision Stand to Sit: 6: Modified independent (Device/Increase time) Details for Transfer Assistance: min cues for safety; requries increased time due to pain  Ambulation/Gait Ambulation/Gait Assistance: 5: Supervision Ambulation Distance (Feet): 150 Feet Assistive device: None Ambulation/Gait Assistance Details: pt O2 stayed between 96%-98%; Hr ranged from 107 to 124; requried 2  standing rest breaks due to increased HR; pt asymptomatic. able to carry on conversation during amb Gait Pattern: Step-through pattern;Decreased stride length Gait velocity: decreased; unable to increase safely Stairs: No Wheelchair Mobility Wheelchair Mobility: No    Exercises     PT Diagnosis:    PT Problem List:   PT Treatment Interventions:     PT Goals Acute Rehab PT Goals PT Goal  Formulation: With patient Time For Goal Achievement: 09/23/12 Potential to Achieve Goals: Good PT Goal: Sit to Stand - Progress: Progressing toward goal PT Goal: Stand to Sit - Progress: Progressing toward goal PT Transfer Goal: Bed to Chair/Chair to Bed - Progress: Progressing toward goal PT Goal: Ambulate - Progress: Progressing toward goal  Visit Information  Last PT Received On: 09/17/12 Assistance Needed: +1    Subjective Data  Subjective: "i am feeling better today. I got the good stuff in me." daughter present; pt lying supine agreeable to walk  Patient Stated Goal: return home   Cognition  Cognition Arousal/Alertness: Awake/alert Behavior During Therapy: WFL for tasks assessed/performed Overall Cognitive Status: Within Functional Limits for tasks assessed    Balance  Balance Balance Assessed: No  End of Session PT - End of Session Equipment Utilized During Treatment: Gait belt Activity Tolerance: Patient tolerated treatment well Patient left: in bed;with call bell/phone within reach;with family/visitor present Nurse Communication: Mobility status   GP     Gustavus Bryant, [place] 09/17/2012, 3:08 PM

## 2012-09-18 LAB — CBC
HCT: 33.1 % — ABNORMAL LOW (ref 36.0–46.0)
Hemoglobin: 11.5 g/dL — ABNORMAL LOW (ref 12.0–15.0)
MCH: 31.9 pg (ref 26.0–34.0)
MCHC: 34.7 g/dL (ref 30.0–36.0)

## 2012-09-18 LAB — BASIC METABOLIC PANEL
BUN: 6 mg/dL (ref 6–23)
GFR calc non Af Amer: 70 mL/min — ABNORMAL LOW (ref >90)
Glucose, Bld: 107 mg/dL — ABNORMAL HIGH (ref 70–99)
Potassium: 4.3 mEq/L (ref 3.5–5.1)

## 2012-09-18 NOTE — Progress Notes (Signed)
Patient ID: Heidi [name], female   DOB: 06/07/1934, 78 y.o.   MRN: [id number] 3 Days Post-Op  Subjective: Much less nausea and belching.  No flatus yet, but "feels stomach rumbling."  Objective: Vital signs in last 24 hours: Temp:  [97.9 F (36.6 C)-98.6 F (37 C)] 98.2 F (36.8 C) (05/17 0300) Pulse Rate:  [75-92] 77 (05/17 0300) Resp:  [13-23] 14 (05/17 0832) BP: (131-170)/(67-84) 156/81 mmHg (05/17 0300) SpO2:  [96 %-98 %] 96 % (05/17 0832) Last BM Date: 09/15/12  Intake/Output from previous day: 05/16 0701 - 05/17 0700 In: 1379.2 [I.V.:1379.2] Out: 5638 [Urine:1550] Intake/Output this shift:    General appearance: alert, cooperative and no distress Resp: breathing comfortably Cardio: regular rate and rhythm GI: soft, non tender, sl distended.  dressing/onQ in place Extremities: extremities normal, atraumatic, no cyanosis or edema  Lab Results:   Recent Labs  09/17/12 0500 09/18/12 0450  WBC 11.9* 5.7  HGB 12.1 11.5*  HCT 34.9* 33.1*  PLT 186 181   BMET  Recent Labs  09/17/12 0500 09/18/12 0450  NA 129* 138  K 4.1 4.3  CL 97 103  CO2 26 27  GLUCOSE 130* 107*  BUN 4* 6  CREATININE 0.58 0.80  CALCIUM 8.7 9.3   PT/INR No results found for this basename: LABPROT, INR,  in the last 72 hours ABG No results found for this basename: PHART, PCO2, PO2, HCO3,  in the last 72 hours  Studies/Results: No results found.  Anti-infectives: Anti-infectives   Start     Dose/Rate Route Frequency Ordered Stop   09/15/12 1645  clindamycin (CLEOCIN) IVPB 900 mg     900 mg 100 mL/hr over 30 Minutes Intravenous 3 times per day 09/15/12 1641 09/16/12 0541   09/15/12 0600  clindamycin (CLEOCIN) IVPB 900 mg     900 mg 100 mL/hr over 30 Minutes Intravenous On call to O.R. 09/14/12 1429 09/15/12 1245   09/15/12 0600  gentamicin (GARAMYCIN) 284 mg in dextrose 5 % 100 mL IVPB     5 mg/kg  56.8 kg 107.1 mL/hr over 60 Minutes Intravenous On call to O.R. 09/14/12 1429  09/15/12 1300   09/14/12 1429  neomycin (MYCIFRADIN) tablet 1,000 mg  Status:  Discontinued     1,000 mg Oral 3 times per day 09/14/12 1429 09/15/12 1641   09/14/12 1429  erythromycin (E-MYCIN) tablet 1,000 mg  Status:  Discontinued     1,000 mg Oral 3 times per day 09/14/12 1429 09/15/12 1641      Assessment/Plan: s/p Procedure(s): LAPAROSCOPIC ILEOCOLECTOMY (N/A) Clear liquid Full liquid for dinner Suppository tomorrow if no flatus by then Ambulate IS Entereg Await return of bowel function.    LOS: 3 days    [facility] 09/18/2012

## 2012-09-18 NOTE — Progress Notes (Signed)
This encounter was created in error - please disregard.

## 2012-09-19 LAB — CBC
HCT: 31.9 % — ABNORMAL LOW (ref 36.0–46.0)
Hemoglobin: 11 g/dL — ABNORMAL LOW (ref 12.0–15.0)
MCH: 31.4 pg (ref 26.0–34.0)
MCHC: 34.5 g/dL (ref 30.0–36.0)
MCV: 91.1 fL (ref 78.0–100.0)

## 2012-09-19 LAB — BASIC METABOLIC PANEL
BUN: 6 mg/dL (ref 6–23)
Calcium: 9.4 mg/dL (ref 8.4–10.5)
Creatinine, Ser: 0.75 mg/dL (ref 0.50–1.10)
GFR calc non Af Amer: 80 mL/min — ABNORMAL LOW (ref >90)
Glucose, Bld: 104 mg/dL — ABNORMAL HIGH (ref 70–99)
Sodium: 137 mEq/L (ref 135–145)

## 2012-09-19 NOTE — Progress Notes (Signed)
Patient ID: DEIDREA GAETZ, female   DOB: April 12, 1935, 78 y.o.   MRN: [id number] 4 Days Post-Op  Subjective: Tolerated clears, then fulls.  Now with flatus.  No n/v. Marland Kitchen Objective: Vital signs in last 24 hours: Temp:  [97.9 F (36.6 C)-98.8 F (37.1 C)] 98.8 F (37.1 C) (05/18 0515) Pulse Rate:  [73-83] 74 (05/18 0515) Resp:  [13-19] 17 (05/18 0515) BP: (142-158)/(70-80) 158/71 mmHg (05/18 0515) SpO2:  [95 %-100 %] 95 % (05/18 0515) Weight:  [121 lb 11.1 oz (55.2 kg)] 121 lb 11.1 oz (55.2 kg) (05/18 0515) Last BM Date: 09/15/12  Intake/Output from previous day: 05/17 0701 - 05/18 0700 In: 639.2 [I.V.:639.2] Out: 650 [Urine:650] Intake/Output this shift:    General appearance: alert, cooperative and no distress Resp: breathing comfortably Cardio: regular rate and rhythm GI: soft, non tender, sl distended.  Dressings removed.  No erythema or wound drainage.   Extremities: extremities normal, atraumatic, no cyanosis or edema  Lab Results:   Recent Labs  09/18/12 0450 09/19/12 0602  WBC 5.7 7.8  HGB 11.5* 11.0*  HCT 33.1* 31.9*  PLT 181 193   BMET  Recent Labs  09/18/12 0450 09/19/12 0602  NA 138 137  K 4.3 4.0  CL 103 103  CO2 27 28  GLUCOSE 107* 104*  BUN 6 6  CREATININE 0.80 0.75  CALCIUM 9.3 9.4   PT/INR No results found for this basename: LABPROT, INR,  in the last 72 hours ABG No results found for this basename: PHART, PCO2, PO2, HCO3,  in the last 72 hours  Studies/Results: No results found.  Anti-infectives: Anti-infectives   Start     Dose/Rate Route Frequency Ordered Stop   09/15/12 1645  clindamycin (CLEOCIN) IVPB 900 mg     900 mg 100 mL/hr over 30 Minutes Intravenous 3 times per day 09/15/12 1641 09/16/12 0541   09/15/12 0600  clindamycin (CLEOCIN) IVPB 900 mg     900 mg 100 mL/hr over 30 Minutes Intravenous On call to O.R. 09/14/12 1429 09/15/12 1245   09/15/12 0600  gentamicin (GARAMYCIN) 284 mg in dextrose 5 % 100 mL IVPB     5 mg/kg   56.8 kg 107.1 mL/hr over 60 Minutes Intravenous On call to O.R. 09/14/12 1429 09/15/12 1300   09/14/12 1429  neomycin (MYCIFRADIN) tablet 1,000 mg  Status:  Discontinued     1,000 mg Oral 3 times per day 09/14/12 1429 09/15/12 1641   09/14/12 1429  erythromycin (E-MYCIN) tablet 1,000 mg  Status:  Discontinued     1,000 mg Oral 3 times per day 09/14/12 1429 09/15/12 1641      Assessment/Plan: s/p Procedure(s): LAPAROSCOPIC ILEOCOLECTOMY (N/A) Regular diet. Ambulate IS Entereg Await bowel movement. Hope for home tomorrow.     LOS: 4 days    [facility] 09/19/2012

## 2012-09-20 MED ORDER — ACETAMINOPHEN 500 MG PO TABS: 1000.0000 mg | ORAL_TABLET | Freq: Four times a day (QID) | ORAL | Status: DC | PRN

## 2012-09-20 MED ORDER — OXYCODONE-ACETAMINOPHEN 5-325 MG PO TABS: 1.0000 | ORAL_TABLET | ORAL | Status: DC | PRN

## 2012-09-20 NOTE — Progress Notes (Signed)
Agree with sign-off per PTA  [place], [place], [place]

## 2012-09-20 NOTE — Discharge Summary (Addendum)
Physician Discharge Summary  Patient ID: Heidi [name] MRN: [id number] DOB/AGE: 09-27-1934 78 y.o.  Admit date: 09/15/2012 Discharge date: 09/20/2012  Admission Diagnoses:  Patient Active Problem List   Diagnosis Date Noted  . Cecal polyp with high grade dysplasia 09/10/2012    Priority: High  . Colonic mass 09/01/2012  . Stricture and stenosis of esophagus 05/27/2011  . Hemorrhage of rectum and anus 05/27/2011  . Esophageal reflux 07/31/2010  . BILIARY CIRRHOSIS 06/07/2008  . HYPERTENSION 08/23/2007  . ARTHRITIS 08/23/2007  . CATARACT, LEFT EYE 11/27/2005  . ULCERATIVE COLITIS 08/29/2005  . DIVERTICULOSIS, COLON 02/05/2001    Discharge Diagnoses:  Patient Active Problem List   Diagnosis Date Noted  . Cecal polyp with invasive colonic adenocarcinoma 09/10/2012    Priority: High  .    Marland Kitchen Stricture and stenosis of esophagus 05/27/2011  . Hemorrhage of rectum and anus 05/27/2011  . Esophageal reflux 07/31/2010  . BILIARY CIRRHOSIS 06/07/2008  . HYPERTENSION 08/23/2007  . ARTHRITIS 08/23/2007  . CATARACT, LEFT EYE 11/27/2005  . ULCERATIVE COLITIS 08/29/2005  . DIVERTICULOSIS, COLON 02/05/2001    Discharged Condition: stable  Hospital Course:  Patient was admitted to the floor following laparoscopic right hemicolectomy.  She mobilized well.  She was able to void after foley removal.  She did have a fair amount of belching and an ileus.  She eventually had bowel function and had her diet advanced.  She was able to transition to oral pain medication.  She was discharged to home in stable condition.    Consults: None  Significant Diagnostic Studies: labs: acceptable for post op  Treatments: surgery: see above  Discharge Exam: Blood pressure 121/76, pulse 76, temperature 98.6 F (37 C), temperature source Oral, resp. rate 18, height 5' 2"  (1.575 m), weight 121 lb 11.1 oz (55.2 kg), SpO2 97.00%. General appearance: alert, cooperative and no distress GI: soft, mildly  protuberant.  approp tender at incision Extremities: extremities normal, atraumatic, no cyanosis or edema  Disposition: Final discharge disposition not confirmed  Discharge Orders   Future Appointments Provider Department Dept Phone   10/04/2012 1:30 PM Stark Klein, [facility], [place] [phone]   03/24/2013 7:30 AM Hayden Pedro, MD [facility] [phone]   Future Orders Complete By Expires     Diet - low sodium heart healthy  As directed     Discharge wound care:  As directed     Comments:      Change bloody steristrips prior to d/c    Increase activity slowly  As directed         Medication List    TAKE these medications       acetaminophen 500 MG tablet  Commonly known as:  TYLENOL  Take 2 tablets (1,000 mg total) by mouth every 6 (six) hours as needed (mild pain or fever.  DO NOT TAKE MORE THAN 8 PAIN PILLS OF ANY KIND IN 24 HOURS.).     aspirin 81 MG tablet  Take 81 mg by mouth daily.     calcium carbonate 600 MG Tabs  Commonly known as:  OS-CAL  Take 600 mg by mouth daily.     CoQ-10 200 MG Caps  Take 1 capsule by mouth daily.     cyanocobalamin 500 MCG tablet  Take 500 mcg by mouth daily.     ezetimibe 10 MG tablet  Commonly known as:  ZETIA  Take 5 mg by mouth 2 (two) times a week.  hydrocortisone 25 MG suppository  Commonly known as:  ANUSOL-HC  Place 1 suppository (25 mg total) rectally every 12 (twelve) hours.     LIVALO 4 MG Tabs  Generic drug:  Pitavastatin Calcium  Take 0.5 tablets by mouth 2 (two) times a week.     mesalamine 1.2 G EC tablet  Commonly known as:  LIALDA  Take 2,400 mg by mouth daily with breakfast.     mometasone 50 MCG/ACT nasal spray  Commonly known as:  NASONEX  Place 2 sprays into the nose daily as needed. Allergies.     omeprazole 20 MG capsule  Commonly known as:  PRILOSEC  Take 20 mg by mouth daily.     oxyCODONE-acetaminophen 5-325 MG per tablet  Commonly known as:   PERCOCET/ROXICET  Take 1-2 tablets by mouth every 4 (four) hours as needed (do not take more than 8 pain pills including tylenol in 24 hours.).     PRESERVISION AREDS PO  Take 1 tablet by mouth 2 (two) times daily.     ursodiol 300 MG capsule  Commonly known as:  ACTIGALL  Take 1 capsule (300 mg total) by mouth 2 (two) times daily.           Follow-up Information   Follow up with [facility], MD. Call in 2 weeks.   Contact information:   [address] [place] [address] [phone]       Signed: Stark Klein 09/20/2012, 8:44 AM

## 2012-09-20 NOTE — Progress Notes (Signed)
Physical Therapy Treatment Patient Details Name: Heidi [name] MRN: [id number] DOB: [birth year] Today's Date: 09/20/2012 Time: 0810-0820 PT Time Calculation (min): 10 min  PT Assessment / Plan / Recommendation Comments on Treatment Session  Patient close to baseline with activity. Walking unit with staff and family. Completed stair training and added to POC. Will sign off at this time    Follow Up Recommendations  No PT follow up     Does the patient have the potential to tolerate intense rehabilitation     Barriers to Discharge        Equipment Recommendations  None recommended by PT    Recommendations for Other Services    Frequency     Plan All goals met and education completed, patient dischaged from PT services    Precautions / Restrictions Precautions Precautions: None   Pertinent Vitals/Pain Denied pain    Mobility  Bed Mobility Supine to Sit: 7: Independent Transfers Sit to Stand: 7: Independent Stand to Sit: 7: Independent Ambulation/Gait Ambulation/Gait Assistance: 6: Modified independent (Device/Increase time) Ambulation Distance (Feet): 600 Feet Assistive device: None Gait Pattern: Within Functional Limits Stairs: Yes Stairs Assistance: 5: Supervision Stair Management Technique: Step to pattern;Forwards Number of Stairs: 12    Exercises     PT Diagnosis:    PT Problem List:   PT Treatment Interventions:     PT Goals Acute Rehab PT Goals PT Goal: Sit to Stand - Progress: Met PT Goal: Stand to Sit - Progress: Met PT Transfer Goal: Bed to Chair/Chair to Bed - Progress: Met PT Goal: Ambulate - Progress: Met  Visit Information  Last PT Received On: 09/20/12 Assistance Needed: +1    Subjective Data      Cognition  Cognition Arousal/Alertness: Awake/alert Behavior During Therapy: WFL for tasks assessed/performed Overall Cognitive Status: Within Functional Limits for tasks assessed    Balance     End of Session PT - End of  Session Activity Tolerance: Patient tolerated treatment well Patient left: in chair;with call bell/phone within reach;with nursing in room   GP     Jacqualyn Posey 09/20/2012, 11:39 AM  09/20/2012 Jacqualyn Posey PTA [phone] pager [phone] office

## 2012-09-20 NOTE — Progress Notes (Signed)
Pt discharged to home

## 2012-09-21 ENCOUNTER — Telehealth (INDEPENDENT_AMBULATORY_CARE_PROVIDER_SITE_OTHER): Payer: Self-pay | Admitting: *Deleted

## 2012-09-21 NOTE — Telephone Encounter (Signed)
Patient's daughter called concerned about patient this morning.  Daughter states that patient was doing very well while in the hospital but ever since coming home the patient just wants to lay in the bed, she states after a few bite she is full, and won't drink.  Daughter also states that patient has had 15 loose stools since coming home yesterday.  Daughter states that last night she had to go with her mother to the restroom due to she was becoming dizzy.  Daughter took BP this morning which was 99/80 and then HR was 130-140 sitting.  Patient also told daughter that she thinks there is blood in her stool however daughter states that stool is a dark chestnut color but she doesn't feel that their is blood in the stool.  Daughter is just concerned about patient's status and concerned about dehydration since patient will not eat or drink much.

## 2012-09-21 NOTE — Telephone Encounter (Signed)
Patient's daughter called to let us know that her mother is doing a lot better. She is having less loose stool and she is up moving around and has ate, the color of her stool has turned back to a brown color and passing gas. Robbin was going to the store to buy her some bananas and some different juice for to drink. She just wanted to call back here to Dr Barry Dienes know how her mother is doing

## 2012-09-22 ENCOUNTER — Telehealth (INDEPENDENT_AMBULATORY_CARE_PROVIDER_SITE_OTHER): Payer: Self-pay | Admitting: General Surgery

## 2012-09-22 NOTE — Telephone Encounter (Signed)
Discussed pathology with pt. Will order CT and CEA.

## 2012-09-23 ENCOUNTER — Other Ambulatory Visit (INDEPENDENT_AMBULATORY_CARE_PROVIDER_SITE_OTHER): Payer: Self-pay | Admitting: General Surgery

## 2012-09-23 ENCOUNTER — Telehealth: Payer: Self-pay | Admitting: *Deleted

## 2012-09-23 DIAGNOSIS — C180 Malignant neoplasm of cecum: Secondary | ICD-10-CM

## 2012-09-23 NOTE — Telephone Encounter (Signed)
Spoke with patient by phone and confirmed appointment with Dr. Benay Spice for 10/08/12.  Contact names and phone numbers were provided.

## 2012-09-24 ENCOUNTER — Telehealth (INDEPENDENT_AMBULATORY_CARE_PROVIDER_SITE_OTHER): Payer: Self-pay

## 2012-09-24 NOTE — Telephone Encounter (Signed)
Patients Daughter (POA )states Dr. Barry Dienes was to have  Ordered a CT and Lab-CEA also she would like later appointment time if possible  On 10/04/12

## 2012-09-28 NOTE — Telephone Encounter (Signed)
Spoke with the patient this morning.  She says her diarrhea has resolved and her stools are soft but not loose.  I told her I would check with Dr. Barry Dienes and would call her later today if scan is ordered for this week.

## 2012-10-04 ENCOUNTER — Other Ambulatory Visit (INDEPENDENT_AMBULATORY_CARE_PROVIDER_SITE_OTHER): Payer: Self-pay

## 2012-10-04 ENCOUNTER — Ambulatory Visit (INDEPENDENT_AMBULATORY_CARE_PROVIDER_SITE_OTHER): Payer: Medicare Other | Admitting: General Surgery

## 2012-10-04 ENCOUNTER — Encounter (INDEPENDENT_AMBULATORY_CARE_PROVIDER_SITE_OTHER): Payer: Medicare Other | Admitting: General Surgery

## 2012-10-04 ENCOUNTER — Other Ambulatory Visit (INDEPENDENT_AMBULATORY_CARE_PROVIDER_SITE_OTHER): Payer: Self-pay | Admitting: General Surgery

## 2012-10-04 ENCOUNTER — Telehealth (INDEPENDENT_AMBULATORY_CARE_PROVIDER_SITE_OTHER): Payer: Self-pay | Admitting: General Surgery

## 2012-10-04 ENCOUNTER — Encounter (INDEPENDENT_AMBULATORY_CARE_PROVIDER_SITE_OTHER): Payer: Self-pay | Admitting: General Surgery

## 2012-10-04 VITALS — BP 122/76 | HR 69 | Temp 98.4°F | Resp 16 | Ht 62.0 in | Wt 120.0 lb

## 2012-10-04 DIAGNOSIS — C189 Malignant neoplasm of colon, unspecified: Secondary | ICD-10-CM

## 2012-10-04 DIAGNOSIS — C180 Malignant neoplasm of cecum: Secondary | ICD-10-CM | POA: Diagnosis not present

## 2012-10-04 NOTE — Progress Notes (Signed)
HISTORY: Patient is approximately 3 weeks status post right hemicolectomy for cecal polyp. This polyp was found cancer. She is doing quite well. She is not on analgesics. She is back to eating a regular diet.  Her energy level is reasonably good.  She is not  having drainage from wound.  She denies fevers/ chills.  She is having some loose stools.      EXAM: General:  Alert and oriented.   Incision:  Well healed.     PATHOLOGY: Diagnosis Colon, segmental resection for tumor, Right - INVASIVE COLORECTAL ADENOCARCINOMA ASSOCIATED WITH TUBULAR ADENOMA WITH HIGH GRADE DYSPLASIA. - TUMOR FOCALLY EXTENDS INTO SUPERFICIAL SUBMUCOSA. - MARGINS NOT INVOLVED. - EIGHT BENIGN LYMPH NODES (0/8).   ASSESSMENT AND PLAN:   Cecal cancer No evidence of surgical complications.  The patient has an appointment at the [facility] with Dr. Benay Spice and she will get a CEA and a CT scan.      Milus Height, MD Surgical Oncology, [facility]  Jerlyn Ly, MD Jerlyn Ly, MD

## 2012-10-04 NOTE — Patient Instructions (Signed)
Get labs drawn and CT scan.  Follow up in 6 months.

## 2012-10-04 NOTE — Addendum Note (Signed)
Addended by: Jeralyn Ruths on: 10/04/2012 02:18 PM   Modules accepted: Orders

## 2012-10-04 NOTE — Assessment & Plan Note (Signed)
No evidence of surgical complications.  The patient has an appointment at the [facility] with Dr. Benay Spice and she will get a CEA and a CT scan.

## 2012-10-04 NOTE — Telephone Encounter (Signed)
Left message for patient to call office for  appt day and time at  GI  [place] 6\4\14  245 pm  Drink contrast  1st bottle 1pm and 2nd bottle 2pm no solids 4 hours prior to   Test

## 2012-10-06 ENCOUNTER — Other Ambulatory Visit (INDEPENDENT_AMBULATORY_CARE_PROVIDER_SITE_OTHER): Payer: Self-pay | Admitting: General Surgery

## 2012-10-06 ENCOUNTER — Ambulatory Visit
Admission: RE | Admit: 2012-10-06 | Discharge: 2012-10-06 | Disposition: A | Discharge status: Home / Self Care | Payer: Medicare Other | Source: Ambulatory Visit | Attending: General Surgery | Admitting: General Surgery

## 2012-10-06 DIAGNOSIS — C180 Malignant neoplasm of cecum: Secondary | ICD-10-CM

## 2012-10-06 DIAGNOSIS — K6389 Other specified diseases of intestine: Secondary | ICD-10-CM | POA: Diagnosis not present

## 2012-10-06 DIAGNOSIS — C189 Malignant neoplasm of colon, unspecified: Secondary | ICD-10-CM

## 2012-10-06 MED ORDER — IOHEXOL 300 MG/ML  SOLN
100.0000 mL | Freq: Once | INTRAMUSCULAR | Status: AC | PRN
  Administered 2012-10-06: 100 mL via INTRAVENOUS

## 2012-10-08 ENCOUNTER — Telehealth: Payer: Self-pay | Admitting: Oncology

## 2012-10-08 ENCOUNTER — Ambulatory Visit (HOSPITAL_BASED_OUTPATIENT_CLINIC_OR_DEPARTMENT_OTHER): Payer: Medicare Other | Admitting: Oncology

## 2012-10-08 ENCOUNTER — Encounter: Payer: Self-pay | Admitting: Oncology

## 2012-10-08 ENCOUNTER — Ambulatory Visit: Payer: Medicare Other

## 2012-10-08 VITALS — BP 155/74 | HR 96 | Temp 97.6°F | Resp 18 | Ht 62.0 in | Wt 120.5 lb

## 2012-10-08 DIAGNOSIS — C180 Malignant neoplasm of cecum: Secondary | ICD-10-CM

## 2012-10-08 DIAGNOSIS — D126 Benign neoplasm of colon, unspecified: Secondary | ICD-10-CM | POA: Diagnosis not present

## 2012-10-08 DIAGNOSIS — K745 Biliary cirrhosis, unspecified: Secondary | ICD-10-CM | POA: Diagnosis not present

## 2012-10-08 NOTE — Progress Notes (Signed)
[facility] Patient Consult   Referring MD: Patches Mcdonnell y.o.  1934/12/10    Reason for Referral: Colon cancer     HPI: She has a history of ulcerative colitis and primary biliary cirrhosis. She is followed by Dr. Deatra Ina. She reports a recent flare of the colitis treated with prednisone. She was taken to a colonoscopy procedure on 08/05/2012. A sessile polyp measuring 2 cm was noted at the cecum. A second 15 mm polyp was noted. The larger polyp was biopsied. A sessile 3-4 mm polyp was removed from the mid transverse colon. Faint erythema was noted in the sigmoid colon  The pathology from the cecal polyp revealed fragments of high-grade glandular dysplasia with areas suspicious for invasion the polyp from the transverse colon was a tubular adenoma. Biopsies from the left colon revealed benign colorectal mucosa with no evidence of active inflammation, dysplasia, or malignancy.  She was referred to Dr. Barry Dienes. She was taken to a laparoscopic right hemicolectomy on 09/15/2012. The omentum,:, Small bowel, peritoneum, liver, and stomach appeared normal. The pathology ([id number]) C. confirmed an invasive adenocarcinoma associated with a tubular adenoma with high-grade dysplasia. Tumor focally extended into superficial submucosa. The resection margins were negative. No lymphovascular or perineural invasion was identified. 8 lymph nodes were negative for metastatic carcinoma. A 0.2 cm tan sessile polyp was located 0.9 cm from the mass.  She is recovering from surgery. She reports mild discomfort in the right abdomen.  She was referred for a staging CT of the abdomen and pelvis on 10/06/2012. The lung bases appear clear aside from atelectasis in the lingula. No focal abnormality in the liver. In the anterior right peritoneal cavity a small fluid collection was noted abutting the anterior abdominal wall. Surgical changes are noted in the right abdomen. Past  Medical History  Diagnosis Date  . Diverticulosis of colon   . GERD (gastroesophageal reflux disease)   . Allergy   . Macular degeneration   . Heart murmur   . Osteoporosis     osteopenia  . Ulcerative colitis   . Cirrhosis, primary biliary   . High cholesterol   . Dysrhythmia     "skips beats" (09/15/2012)  . Arthritis     "a little; joints" (09/15/2012)  . Chronic lower back pain   . Basal cell carcinoma of forehead ~ 2008    "lasered/burned off" (09/15/2012)   .    G3 P3   Past Surgical History  Procedure Laterality Date  . Tubal ligation  1973  . Tonsillectomy  1952  . Knee arthroscopy Right 1990's  . Colonoscopy    . Laparoscopic right colon resection  09/15/2012  . Cataract extraction w/ intraocular lens  implant, bilateral Bilateral ~ 2008  . Laparoscopic partial colectomy N/A 09/15/2012    Procedure: LAPAROSCOPIC ILEOCOLECTOMY;  Surgeon: Stark Klein, MD;  Location: [facility] OR;  Service: General;  Laterality: N/A;    Family History  Problem Relation Age of Onset  . Breast cancer Paternal Aunt   . ? Ovarian cancer  Paternal Grandmother   . Heart disease Mother   . Heart disease Brother   . Stroke Father   . Cancer Brother     lung-smoker   2 brothers, one sister  , no other family history of cancer  Current outpatient prescriptions:cyanocobalamin 500 MCG tablet, Take 500 mcg by mouth daily. , Disp: , Rfl: ;  mesalamine (LIALDA) 1.2 G EC tablet, Take 2,400 mg by mouth  daily with breakfast., Disp: , Rfl: ;  mometasone (NASONEX) 50 MCG/ACT nasal spray, Place 2 sprays into the nose daily as needed. Allergies., Disp: , Rfl: ;  Multiple Vitamins-Minerals (PRESERVISION AREDS PO), Take 1 tablet by mouth 2 (two) times daily., Disp: , Rfl:  omeprazole (PRILOSEC) 20 MG capsule, Take 20 mg by mouth daily., Disp: , Rfl: ;  ursodiol (ACTIGALL) 300 MG capsule, Take 1 capsule (300 mg total) by mouth 2 (two) times daily., Disp: 180 capsule, Rfl: 4;  aspirin 81 MG tablet, Take 81 mg by mouth  daily.  , Disp: , Rfl: ;  calcium carbonate (OS-CAL) 600 MG TABS, Take 600 mg by mouth daily. , Disp: , Rfl: ;  Coenzyme Q10 (COQ-10) 200 MG CAPS, Take 1 capsule by mouth daily., Disp: , Rfl:  ezetimibe (ZETIA) 10 MG tablet, Take 5 mg by mouth 2 (two) times a week., Disp: , Rfl: ;  Pitavastatin Calcium (LIVALO) 4 MG TABS, Take 0.5 tablets by mouth 2 (two) times a week. , Disp: , Rfl:   Allergies:  Allergies  Allergen Reactions  . Codeine Nausea And Vomiting  . Penicillins Hives    Social History:  She works in an office occupation. She quit smoking cigarettes in September of 2013 after smoking approximately 3/4 of a pack per day since her 23s. One glass of wine per day. No risk factor for HIV or hepatitis.? Diagnosis of "hepatitis "in the past.  ROS:   Positives include: occasional dysphasia, blood in the toilet after bowel movements, frequent bowel movements with incomplete emptying  A complete ROS was otherwise negative.  Physical Exam:  Blood pressure 155/74, pulse 96, temperature 97.6 F (36.4 C), temperature source Oral, resp. rate 18, height 5' 2"  (1.575 m), weight 120 lb 8 oz (54.658 kg), SpO2 100.00%.  HEENT:  Oropharynx without visible mass, neck without mass Lungs:  Clear bilaterally Cardiac:  Regular rate and rhythm Abdomen:  No hepatomegaly, healed surgical incision, no mass, mild tenderness in the right low abdomen  Vascular:  No leg edema Lymph nodes:  No cervical, supraclavicular, axillary, or inguinal nodes Neurologic:  Alert and oriented, the motor exam appears intact in the upper and lower extremities Skin:  No rash Musculoskeletal:  No spine tenderness   LAB:  CBC  Lab Results  Component Value Date   WBC 7.8 09/19/2012   HGB 11.0* 09/19/2012   HCT 31.9* 09/19/2012   MCV 91.1 09/19/2012   PLT 193 09/19/2012     CMP      Component Value Date/Time   NA 137 09/19/2012 0602   K 4.0 09/19/2012 0602   CL 103 09/19/2012 0602   CO2 28 09/19/2012 0602   GLUCOSE  104* 09/19/2012 0602   BUN 6 09/19/2012 0602   CREATININE 0.75 09/19/2012 0602   CALCIUM 9.4 09/19/2012 0602   PROT 6.8 09/13/2012 0828   ALBUMIN 3.6 09/13/2012 0828   AST 18 09/13/2012 0828   ALT 9 09/13/2012 0828   ALKPHOS 65 09/13/2012 0828   BILITOT 0.6 09/13/2012 0828   GFRNONAA 80* 09/19/2012 0602   GFRAA >90 09/19/2012 0602    CEA on 10/04/2012-5.9  Radiology: as per history of present illness    Assessment/Plan:   1. 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 Colon polyps  3. Mildly elevated postoperative CEA  4. History of tobacco use  5. Ulcerative colitis  6. Primary biliary cirrhosis   Disposition:  Ms. Fromer has been diagnosed with stage I colon cancer. I discussed the prognosis and reviewed the details of the surgical pathology report with Ms.Enlow and her daughter. She has a good prognosis for a long-term disease-free survival. I do not recommend adjuvant therapy. We discussed exercise and diet maneuvers that may decrease the risk of developing colon cancer.  The CEA was elevated after surgery. We do not have a preoperative level. The elevated CEA may be related to inflammation from surgery, inflammatory bowel disease, or colon cancer. She will return for a repeat CEA in approximately one month.  She plans to continue surveillance colonoscopies with Dr. Deatra Ina. She is not scheduled for a followup appointment in the oncology clinic. I will be glad to see her in the future as needed.   We discussed CT screening for lung cancer. She appears to meet eligibility criteria for CT screening. She plans to discuss this with Dr. Joylene Draft.  [place], [place] 10/08/2012, 3:27 PM

## 2012-10-08 NOTE — Progress Notes (Signed)
Patient completed Measure of Distress Tool: rated stress level "0/10". Provided patient with new patient packet that was prepared by Marcellus Scott, GI Nurse Navigator.

## 2012-10-08 NOTE — Progress Notes (Signed)
Checked in new pt with no financial concerns. °

## 2012-10-08 NOTE — Telephone Encounter (Signed)
gv and printed appt sched and avs for pt.

## 2012-11-02 ENCOUNTER — Other Ambulatory Visit (HOSPITAL_BASED_OUTPATIENT_CLINIC_OR_DEPARTMENT_OTHER): Payer: Medicare Other | Admitting: Lab

## 2012-11-02 DIAGNOSIS — C180 Malignant neoplasm of cecum: Secondary | ICD-10-CM | POA: Diagnosis not present

## 2012-11-03 LAB — CEA: CEA: 5.1 ng/mL — ABNORMAL HIGH (ref 0.0–5.0)

## 2012-11-04 ENCOUNTER — Encounter: Payer: Self-pay | Admitting: *Deleted

## 2012-11-04 ENCOUNTER — Telehealth: Payer: Self-pay | Admitting: *Deleted

## 2012-11-04 NOTE — Telephone Encounter (Signed)
Message copied by Brien Few on Thu Nov 04, 2012  8:56 AM ------      Message from: Betsy Coder B      Created: Wed Nov 03, 2012  9:07 PM       Please call patient, cea is still mildly elevated, ?IBD or biliary cirrhosis, repeat 1-2 months, copy to Dr. Joylene Draft ------

## 2012-11-10 ENCOUNTER — Other Ambulatory Visit: Payer: Self-pay | Admitting: *Deleted

## 2012-11-10 ENCOUNTER — Telehealth: Payer: Self-pay | Admitting: Oncology

## 2012-11-10 DIAGNOSIS — C180 Malignant neoplasm of cecum: Secondary | ICD-10-CM

## 2012-11-10 NOTE — Telephone Encounter (Signed)
Called pt , left message for pt ,appt for lab  01/04/13

## 2012-12-08 ENCOUNTER — Other Ambulatory Visit: Payer: Self-pay

## 2013-01-04 ENCOUNTER — Other Ambulatory Visit (HOSPITAL_BASED_OUTPATIENT_CLINIC_OR_DEPARTMENT_OTHER): Payer: Medicare Other | Admitting: Lab

## 2013-01-04 DIAGNOSIS — C180 Malignant neoplasm of cecum: Secondary | ICD-10-CM | POA: Diagnosis not present

## 2013-01-12 DIAGNOSIS — IMO0001 Reserved for inherently not codable concepts without codable children: Secondary | ICD-10-CM | POA: Diagnosis not present

## 2013-01-13 DIAGNOSIS — L089 Local infection of the skin and subcutaneous tissue, unspecified: Secondary | ICD-10-CM | POA: Diagnosis not present

## 2013-01-13 DIAGNOSIS — L0100 Impetigo, unspecified: Secondary | ICD-10-CM | POA: Diagnosis not present

## 2013-01-13 DIAGNOSIS — Z85828 Personal history of other malignant neoplasm of skin: Secondary | ICD-10-CM | POA: Diagnosis not present

## 2013-01-17 ENCOUNTER — Telehealth: Payer: Self-pay | Admitting: *Deleted

## 2013-01-17 DIAGNOSIS — C180 Malignant neoplasm of cecum: Secondary | ICD-10-CM

## 2013-01-17 NOTE — Telephone Encounter (Signed)
Message copied by Brien Few on Mon Jan 17, 2013  8:50 AM ------      Message from: Ladell Pier      Created: Fri Jan 14, 2013  5:18 PM       Please call patient, CEA is still elevated, stable            Likely not related to cancer,            Schedule appt. Next 1 mo. To discuss ------

## 2013-01-17 NOTE — Telephone Encounter (Signed)
Pt returned call, lab results given. She voiced understanding. Knows to expect call from schedulers with appt.

## 2013-01-19 ENCOUNTER — Other Ambulatory Visit: Payer: Self-pay

## 2013-01-19 DIAGNOSIS — Z1231 Encounter for screening mammogram for malignant neoplasm of breast: Secondary | ICD-10-CM

## 2013-01-20 ENCOUNTER — Telehealth: Payer: Self-pay | Admitting: Oncology

## 2013-01-25 DIAGNOSIS — Z85828 Personal history of other malignant neoplasm of skin: Secondary | ICD-10-CM | POA: Diagnosis not present

## 2013-01-25 DIAGNOSIS — B029 Zoster without complications: Secondary | ICD-10-CM | POA: Diagnosis not present

## 2013-01-25 DIAGNOSIS — L570 Actinic keratosis: Secondary | ICD-10-CM | POA: Diagnosis not present

## 2013-02-07 ENCOUNTER — Ambulatory Visit
Admission: RE | Admit: 2013-02-07 | Discharge: 2013-02-07 | Disposition: A | Discharge status: Home / Self Care | Payer: Medicare Other | Source: Ambulatory Visit

## 2013-02-07 DIAGNOSIS — Z1231 Encounter for screening mammogram for malignant neoplasm of breast: Secondary | ICD-10-CM

## 2013-02-11 DIAGNOSIS — I10 Essential (primary) hypertension: Secondary | ICD-10-CM | POA: Diagnosis not present

## 2013-02-11 DIAGNOSIS — E538 Deficiency of other specified B group vitamins: Secondary | ICD-10-CM | POA: Diagnosis not present

## 2013-02-11 DIAGNOSIS — M899 Disorder of bone, unspecified: Secondary | ICD-10-CM | POA: Diagnosis not present

## 2013-02-11 DIAGNOSIS — E785 Hyperlipidemia, unspecified: Secondary | ICD-10-CM | POA: Diagnosis not present

## 2013-02-11 DIAGNOSIS — R82998 Other abnormal findings in urine: Secondary | ICD-10-CM | POA: Diagnosis not present

## 2013-02-18 DIAGNOSIS — Z124 Encounter for screening for malignant neoplasm of cervix: Secondary | ICD-10-CM | POA: Diagnosis not present

## 2013-02-18 DIAGNOSIS — M1990 Unspecified osteoarthritis, unspecified site: Secondary | ICD-10-CM | POA: Diagnosis not present

## 2013-02-18 DIAGNOSIS — R809 Proteinuria, unspecified: Secondary | ICD-10-CM | POA: Diagnosis not present

## 2013-02-18 DIAGNOSIS — K449 Diaphragmatic hernia without obstruction or gangrene: Secondary | ICD-10-CM | POA: Diagnosis not present

## 2013-02-18 DIAGNOSIS — I10 Essential (primary) hypertension: Secondary | ICD-10-CM | POA: Diagnosis not present

## 2013-02-18 DIAGNOSIS — Z0000 Encounter for general adult medical examination without abnormal findings: Secondary | ICD-10-CM | POA: Diagnosis not present

## 2013-02-18 DIAGNOSIS — E785 Hyperlipidemia, unspecified: Secondary | ICD-10-CM | POA: Diagnosis not present

## 2013-02-18 DIAGNOSIS — H3530 Unspecified macular degeneration: Secondary | ICD-10-CM | POA: Diagnosis not present

## 2013-02-18 DIAGNOSIS — F17200 Nicotine dependence, unspecified, uncomplicated: Secondary | ICD-10-CM | POA: Diagnosis not present

## 2013-02-18 DIAGNOSIS — E538 Deficiency of other specified B group vitamins: Secondary | ICD-10-CM | POA: Diagnosis not present

## 2013-02-18 DIAGNOSIS — Z23 Encounter for immunization: Secondary | ICD-10-CM | POA: Diagnosis not present

## 2013-02-22 ENCOUNTER — Ambulatory Visit (HOSPITAL_BASED_OUTPATIENT_CLINIC_OR_DEPARTMENT_OTHER): Payer: Medicare Other | Admitting: Oncology

## 2013-02-22 ENCOUNTER — Telehealth: Payer: Self-pay | Admitting: Oncology

## 2013-02-22 ENCOUNTER — Other Ambulatory Visit: Payer: Medicare Other | Admitting: Lab

## 2013-02-22 VITALS — BP 172/93 | HR 60 | Temp 97.8°F | Resp 18 | Ht 62.0 in | Wt 126.4 lb

## 2013-02-22 DIAGNOSIS — Z87891 Personal history of nicotine dependence: Secondary | ICD-10-CM

## 2013-02-22 DIAGNOSIS — K745 Biliary cirrhosis, unspecified: Secondary | ICD-10-CM

## 2013-02-22 DIAGNOSIS — C180 Malignant neoplasm of cecum: Secondary | ICD-10-CM

## 2013-02-22 NOTE — Telephone Encounter (Signed)
gv and printed appt sched and avs for pt for NOV adn Feb 2015.Marland KitchenMarland KitchenMarland KitchenMarland Kitchenpt sched at [facility] gor ct on 11.5.14 @ 9am

## 2013-02-22 NOTE — Progress Notes (Signed)
   [facility]    OFFICE PROGRESS NOTE   INTERVAL HISTORY:   Heidi [name] returns for followup of the persistently elevated CEA. She reports feeling completely well. No pain, dyspnea, or difficulty with bowel function. She reports occasionally seen blood at the rim of the toilet bowl. This is benign finding for the past year. No blood in the urine or bowels.  She saw Dr. Joylene Draft for a physical last week. She reports [facility] is offering a discount on CT scans in the month of November.  Objective:  Vital signs in last 24 hours:  Blood pressure 172/93, pulse 60, temperature 97.8 F (36.6 C), temperature source Oral, resp. rate 18, height 5' 2"  (1.575 m), weight 126 lb 6.4 oz (57.335 kg).    HEENT: Neck without mass Lymphatics: No cervical, supraclavicular, axillary, or inguinal nodes Resp: Lungs clear bilaterally Cardio: Regular rate and rhythm GI: No hepatosplenomegaly, no mass, nontender Vascular: No leg edema   Lab Results:  CEA on 11/02/2012-5.1 CEA on 01/04/2013-6.0   Medications: I have reviewed the patient's current medications.  Assessment/Plan: 1. 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 Colon polyps  3. Mildly elevated postoperative CEA , persistently elevated following surgery 4. History of tobacco use -she reports no tobacco use for the past one year 5. Ulcerative colitis  6. Primary biliary cirrhosis    Disposition:  She appears well. The CEA remained mildly elevated after surgery. We will followup on the CEA from today. I suspect the mild elevation of the CEA is a chronic benign finding, potentially related to the history of tobacco use or inflammatory bowel disease.  However given the history of tobacco use and recent diagnosis of colon cancer we decided to proceed with a CT of the chest.  She will return for an office visit and CEA in 4  months.   Betsy Coder, MD  02/22/2013  11:29 AM

## 2013-02-23 ENCOUNTER — Telehealth: Payer: Self-pay | Admitting: *Deleted

## 2013-02-23 NOTE — Telephone Encounter (Signed)
Message copied by Brien Few on Wed Feb 23, 2013  4:54 PM ------      Message from: Betsy Coder B      Created: Wed Feb 23, 2013  4:12 PM       Please call patient, cea still mildly elevated, proceed with CT as scheduled            Copy lab to Dr. Joylene Draft            Be sure she is on for GI conference next few weeks ------

## 2013-02-24 NOTE — Telephone Encounter (Signed)
Pt returned call, CEA results given. Lab routed to Dr. Joylene Draft. GI navigator to follow up on GI conference.

## 2013-03-03 ENCOUNTER — Telehealth: Payer: Self-pay | Admitting: *Deleted

## 2013-03-03 NOTE — Telephone Encounter (Signed)
Per Dr. Benay Spice : Patient was presented at GI Conference on 10/29 and physician group suggest to cancel the CT chest and do PET scan instead. This can look for lung cancer or recurrent colon cancer that CT chest could not. Left VM for patient to call office regarding GI suggestions and will send her a My Chart message.

## 2013-03-07 ENCOUNTER — Other Ambulatory Visit: Payer: Self-pay | Admitting: Oncology

## 2013-03-07 ENCOUNTER — Telehealth: Payer: Self-pay | Admitting: *Deleted

## 2013-03-07 DIAGNOSIS — C180 Malignant neoplasm of cecum: Secondary | ICD-10-CM

## 2013-03-07 NOTE — Telephone Encounter (Signed)
Pt returned call, she is in agreement with plan for PET scan. Understands to expect call from radiology with appt.

## 2013-03-08 ENCOUNTER — Telehealth: Payer: Self-pay | Admitting: Oncology

## 2013-03-08 NOTE — Telephone Encounter (Signed)
Reviewed 11/3 POF PET has been scheduled by Rad shh

## 2013-03-15 ENCOUNTER — Encounter (HOSPITAL_COMMUNITY)
Admission: RE | Admit: 2013-03-15 | Discharge: 2013-03-15 | Disposition: A | Discharge status: Home / Self Care | Payer: Medicare Other | Source: Ambulatory Visit | Attending: Oncology | Admitting: Oncology

## 2013-03-15 DIAGNOSIS — R911 Solitary pulmonary nodule: Secondary | ICD-10-CM | POA: Diagnosis not present

## 2013-03-15 DIAGNOSIS — R970 Elevated carcinoembryonic antigen [CEA]: Secondary | ICD-10-CM | POA: Diagnosis not present

## 2013-03-15 DIAGNOSIS — C189 Malignant neoplasm of colon, unspecified: Secondary | ICD-10-CM | POA: Diagnosis not present

## 2013-03-15 DIAGNOSIS — C180 Malignant neoplasm of cecum: Secondary | ICD-10-CM

## 2013-03-15 LAB — GLUCOSE, CAPILLARY: Glucose-Capillary: 93 mg/dL (ref 70–99)

## 2013-03-15 MED ORDER — FLUDEOXYGLUCOSE F - 18 (FDG) INJECTION
17.5000 | Freq: Once | INTRAVENOUS | Status: AC | PRN
  Administered 2013-03-15: 17.5 via INTRAVENOUS

## 2013-03-17 ENCOUNTER — Telehealth: Payer: Self-pay | Admitting: *Deleted

## 2013-03-17 NOTE — Telephone Encounter (Signed)
Message copied by Domenic Schwab on Thu Mar 17, 2013 10:57 AM ------      Message from: Betsy Coder B      Created: Wed Mar 16, 2013  6:03 PM       Please call patient, PET is negative, f/u as scheduled for office visit and CEA ------

## 2013-03-17 NOTE — Telephone Encounter (Signed)
Per MD; notified pt that PET scan is negative.  Pt verbalized understanding and confirmed appt for 06/23/13.

## 2013-03-18 ENCOUNTER — Other Ambulatory Visit: Payer: Self-pay | Admitting: Gastroenterology

## 2013-03-22 ENCOUNTER — Encounter (INDEPENDENT_AMBULATORY_CARE_PROVIDER_SITE_OTHER): Payer: Self-pay | Admitting: General Surgery

## 2013-03-22 ENCOUNTER — Ambulatory Visit (INDEPENDENT_AMBULATORY_CARE_PROVIDER_SITE_OTHER): Payer: Medicare Other | Admitting: General Surgery

## 2013-03-22 VITALS — BP 128/78 | HR 72 | Temp 98.1°F | Resp 14 | Ht 62.0 in | Wt 126.6 lb

## 2013-03-22 DIAGNOSIS — C180 Malignant neoplasm of cecum: Secondary | ICD-10-CM

## 2013-03-22 NOTE — Patient Instructions (Signed)
Follow up with me in 6 months.  Get colonoscopy in 1 year with Dr. Kelby Fam office.

## 2013-03-24 ENCOUNTER — Ambulatory Visit (INDEPENDENT_AMBULATORY_CARE_PROVIDER_SITE_OTHER): Payer: Medicare Other | Admitting: Ophthalmology

## 2013-03-24 DIAGNOSIS — H43819 Vitreous degeneration, unspecified eye: Secondary | ICD-10-CM

## 2013-03-24 DIAGNOSIS — H3530 Unspecified macular degeneration: Secondary | ICD-10-CM | POA: Diagnosis not present

## 2013-03-28 NOTE — Assessment & Plan Note (Addendum)
No clinical evidence of disease.    Follow up in 6 months.  Will be about that time that colonoscopy will be due.

## 2013-03-28 NOTE — Progress Notes (Signed)
HISTORY: Pt is doing well.  She has a normal appetite and energy level.  She denies bloody bowel movements.  She is not having any constipation or change in bowel habits.  She denies abdominal pain.  She has not had any new health problems.     PERTINENT REVIEW OF SYSTEMS: Otherwise negative.    Filed Vitals:   03/22/13 1649  BP: 128/78  Pulse: 72  Temp: 98.1 F (36.7 C)  Resp: 14   Filed Weights   03/22/13 1649  Weight: 126 lb 9.6 oz (57.425 kg)   EXAM: Head: Normocephalic and atraumatic.  Eyes:  Conjunctivae are normal. Pupils are equal, round, and reactive to light. No scleral icterus.  Neck:  Normal range of motion. Neck supple. No tracheal deviation present. No thyromegaly present.  Resp: No respiratory distress, normal effort. Abd:  Abdomen is soft, non distended and non tender. No masses are palpable.  There is no rebound and no guarding. No evidence of hernia.   Neurological: Alert and oriented to person, place, and time. Coordination normal.  Skin: Skin is warm and dry. No rash noted. No diaphoretic. No erythema. No pallor.  Psychiatric: Normal mood and affect. Normal behavior. Judgment and thought content normal.      ASSESSMENT AND PLAN:   Cecal cancer No clinical evidence of disease.    Follow up in 6 months.  Will be about that time that colonoscopy will be due.      Milus Height, MD Surgical Oncology, [facility]  Jerlyn Ly, MD Jerlyn Ly, MD

## 2013-04-05 ENCOUNTER — Ambulatory Visit: Payer: Medicare Other | Admitting: Oncology

## 2013-06-20 ENCOUNTER — Other Ambulatory Visit: Payer: Self-pay | Admitting: Gastroenterology

## 2013-06-23 ENCOUNTER — Ambulatory Visit (HOSPITAL_BASED_OUTPATIENT_CLINIC_OR_DEPARTMENT_OTHER): Payer: Medicare Other | Admitting: Oncology

## 2013-06-23 ENCOUNTER — Other Ambulatory Visit: Payer: Medicare Other

## 2013-06-23 VITALS — BP 179/93 | HR 63 | Temp 97.4°F | Resp 18 | Ht 62.0 in | Wt 128.2 lb

## 2013-06-23 DIAGNOSIS — K745 Biliary cirrhosis, unspecified: Secondary | ICD-10-CM | POA: Diagnosis not present

## 2013-06-23 DIAGNOSIS — C180 Malignant neoplasm of cecum: Secondary | ICD-10-CM | POA: Diagnosis not present

## 2013-06-23 DIAGNOSIS — Z87891 Personal history of nicotine dependence: Secondary | ICD-10-CM | POA: Diagnosis not present

## 2013-06-23 NOTE — Progress Notes (Signed)
   [facility]    OFFICE PROGRESS NOTE   INTERVAL HISTORY:   She returns as scheduled. She feels well. She has frequent bowel movements. [name] relates the bowel movements to a flare of the colitis which usually occurs during the winter. She is scheduled to see Dr. Deatra [name] within the next few months.  Objective:  Vital signs in last 24 hours:  Blood pressure 188/90, pulse 63, temperature 97.4 F (36.3 C), temperature source Oral, resp. rate 18, height 5' 2"  (1.575 m), weight 128 lb 3.2 oz (58.151 kg), SpO2 100.00%.    HEENT: Neck without mass Lymphatics: No cervical, supra-clavicular, axillary, or inguinal nodes Resp: Lungs clear bilaterally Cardio: Regular rate and rhythm GI: No hepatomegaly, nontender, no mass Vascular: No leg edema  Lab Results:  CEA pending   Medications: I have reviewed the patient's current medications.  Assessment/Plan: 1. 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 Colon polyps  3. Mildly elevated postoperative CEA , persistently elevated following surgery   PET scan 03/15/2013-negative for metastatic disease 4. History of tobacco use -she reports no tobacco use for the past one year  5. Ulcerative colitis  6. Primary biliary cirrhosis  7. 4 mm right lung nodule on the PET scan 03/15/2013 8. Hypertension-currently not on antihypertensive medication, she will followup with Dr. Joylene [name]  Disposition:  She remains in clinical remission from colon cancer. We will followup on the CEA from today. If the CEA is stable she will return for an office visit in 6 months. We will consider further diagnostic evaluation for a rising CEA level.   Heidi Coder, MD  06/23/2013  12:37 PM

## 2013-06-24 ENCOUNTER — Telehealth: Payer: Self-pay | Admitting: *Deleted

## 2013-06-24 ENCOUNTER — Telehealth: Payer: Self-pay | Admitting: Oncology

## 2013-06-24 LAB — CEA: CEA: 5.9 ng/mL — ABNORMAL HIGH (ref 0.0–5.0)

## 2013-06-24 NOTE — Telephone Encounter (Signed)
s.w. pt and advised on Aug appt....pt ok and aware

## 2013-06-24 NOTE — Telephone Encounter (Signed)
Called patient and informed of lower cea and to follow up as scheduled. Per Dr. Benay Spice.  Patient verbalized understanding.

## 2013-06-24 NOTE — Telephone Encounter (Signed)
Left message for patient to call back regarding lab results.

## 2013-06-24 NOTE — Telephone Encounter (Signed)
Message copied by Norma Fredrickson on Fri Jun 24, 2013  2:15 PM ------      Message from: [place], [place]      Created: Fri Jun 24, 2013  1:43 PM                   ----- Message -----         From: Ladell Pier, MD         Sent: 06/24/2013  10:32 AM           To: Tania Ade, RN, Ludwig Lean, RN, #            Please call patient, cea is lower, f/u as scheduled ------

## 2013-07-15 DIAGNOSIS — I10 Essential (primary) hypertension: Secondary | ICD-10-CM | POA: Diagnosis not present

## 2013-07-15 DIAGNOSIS — IMO0002 Reserved for concepts with insufficient information to code with codable children: Secondary | ICD-10-CM | POA: Diagnosis not present

## 2013-07-21 DIAGNOSIS — I10 Essential (primary) hypertension: Secondary | ICD-10-CM | POA: Diagnosis not present

## 2013-09-03 ENCOUNTER — Other Ambulatory Visit: Payer: Self-pay | Admitting: Gastroenterology

## 2013-09-23 ENCOUNTER — Ambulatory Visit (INDEPENDENT_AMBULATORY_CARE_PROVIDER_SITE_OTHER): Payer: Medicare Other | Admitting: Gastroenterology

## 2013-09-23 ENCOUNTER — Encounter: Payer: Self-pay | Admitting: Gastroenterology

## 2013-09-23 ENCOUNTER — Other Ambulatory Visit (INDEPENDENT_AMBULATORY_CARE_PROVIDER_SITE_OTHER): Payer: Medicare Other

## 2013-09-23 VITALS — BP 138/78 | HR 60 | Ht 62.5 in | Wt 126.0 lb

## 2013-09-23 DIAGNOSIS — K745 Biliary cirrhosis, unspecified: Secondary | ICD-10-CM | POA: Diagnosis not present

## 2013-09-23 DIAGNOSIS — C180 Malignant neoplasm of cecum: Secondary | ICD-10-CM | POA: Diagnosis not present

## 2013-09-23 DIAGNOSIS — K5190 Ulcerative colitis, unspecified, without complications: Secondary | ICD-10-CM

## 2013-09-23 DIAGNOSIS — Z85038 Personal history of other malignant neoplasm of large intestine: Secondary | ICD-10-CM | POA: Diagnosis not present

## 2013-09-23 LAB — CBC WITH DIFFERENTIAL/PLATELET
BASOS PCT: 0.7 % (ref 0.0–3.0)
Basophils Absolute: 0 10*3/uL (ref 0.0–0.1)
EOS ABS: 0.4 10*3/uL (ref 0.0–0.7)
EOS PCT: 6 % — AB (ref 0.0–5.0)
HEMATOCRIT: 42.3 % (ref 36.0–46.0)
Hemoglobin: 14.2 g/dL (ref 12.0–15.0)
LYMPHS ABS: 2.3 10*3/uL (ref 0.7–4.0)
Lymphocytes Relative: 36.4 % (ref 12.0–46.0)
MCHC: 33.5 g/dL (ref 30.0–36.0)
MCV: 95.4 fl (ref 78.0–100.0)
MONO ABS: 0.5 10*3/uL (ref 0.1–1.0)
Monocytes Relative: 7.9 % (ref 3.0–12.0)
NEUTROS PCT: 49 % (ref 43.0–77.0)
Neutro Abs: 3.1 10*3/uL (ref 1.4–7.7)
Platelets: 180 10*3/uL (ref 150.0–400.0)
RBC: 4.44 Mil/uL (ref 3.87–5.11)
RDW: 13.4 % (ref 11.5–15.5)
WBC: 6.4 10*3/uL (ref 4.0–10.5)

## 2013-09-23 LAB — HEPATIC FUNCTION PANEL
ALBUMIN: 3.6 g/dL (ref 3.5–5.2)
ALT: 14 U/L (ref 0–35)
AST: 15 U/L (ref 0–37)
Alkaline Phosphatase: 55 U/L (ref 39–117)
BILIRUBIN DIRECT: 0.1 mg/dL (ref 0.0–0.3)
TOTAL PROTEIN: 6.6 g/dL (ref 6.0–8.3)
Total Bilirubin: 1 mg/dL (ref 0.2–1.2)

## 2013-09-23 MED ORDER — MOVIPREP 100 G PO SOLR: 1.0000 | Freq: Once | ORAL | Status: DC

## 2013-09-23 NOTE — Assessment & Plan Note (Signed)
Asymptomatic.  Plan to continue Actigall and repeat LFTs

## 2013-09-23 NOTE — Assessment & Plan Note (Signed)
Plan followup colonoscopy

## 2013-09-23 NOTE — Assessment & Plan Note (Signed)
Left sided colitis well controlled with lialda.  Given the patient's age it was decided not to do a subtotal colectomy, however, but to continue yearly surveillance colonoscopy.  Recommendations #1 continue lialda #2 yearly colonoscopy

## 2013-09-23 NOTE — Progress Notes (Signed)
_                                                                                                                History of Present Illness:  The patient has returned for followup of colitis, colon cancer and PBC.  A carcinoma was resected almost one year ago in the right colon.  She has left-sided ulcerative colitis which is well-controlled with lialda.  She did not receive adjuvant chemotherapy.  She denies abdominal pain, pruritus or jaundice.    Past Medical History  Diagnosis Date  . Diverticulosis of colon   . GERD (gastroesophageal reflux disease)   . Allergy   . Macular degeneration   . Heart murmur   . Osteoporosis     osteopenia  . Ulcerative colitis   . Cirrhosis, primary biliary   . High cholesterol   . Dysrhythmia     "skips beats" (09/15/2012)  . Arthritis     "a little; joints" (09/15/2012)  . Chronic lower back pain   . Basal cell carcinoma of forehead ~ 2008    "lasered/burned off" (09/15/2012)   Past Surgical History  Procedure Laterality Date  . Tubal ligation  1973  . Tonsillectomy  1952  . Knee arthroscopy Right 1990's  . Colonoscopy    . Laparoscopic right colon resection  09/15/2012  . Cataract extraction w/ intraocular lens  implant, bilateral Bilateral ~ 2008  . Laparoscopic partial colectomy N/A 09/15/2012    Procedure: LAPAROSCOPIC ILEOCOLECTOMY;  Surgeon: Stark Klein, MD;  Location: [facility] OR;  Service: General;  Laterality: N/A;   family history includes Breast cancer in her paternal aunt; Cancer in her brother; Colon cancer in her paternal grandmother; Heart disease in her brother and mother; Stroke in her father. Current Outpatient Prescriptions  Medication Sig Dispense Refill  . aspirin 81 MG tablet Take 81 mg by mouth daily.        . calcium carbonate (OS-CAL) 600 MG TABS Take 600 mg by mouth daily.       . Coenzyme Q10 (COQ-10) 200 MG CAPS Take 1 capsule by mouth daily.      . cyanocobalamin 500 MCG tablet Take 500 mcg by mouth  daily.       Marland Kitchen ezetimibe (ZETIA) 10 MG tablet Take 5 mg by mouth 2 (two) times a week.      . irbesartan (AVAPRO) 75 MG tablet Take 75 mg by mouth daily.      . mesalamine (LIALDA) 1.2 G EC tablet Take 2,400 mg by mouth daily with breakfast.      . mometasone (NASONEX) 50 MCG/ACT nasal spray Place 2 sprays into the nose daily as needed. Allergies.      . Multiple Vitamins-Minerals (PRESERVISION AREDS PO) Take 1 tablet by mouth 2 (two) times daily.      Marland Kitchen omeprazole (PRILOSEC) 20 MG capsule Take 20 mg by mouth daily.      . Pitavastatin Calcium (LIVALO) 4 MG TABS Take 0.5 tablets by mouth 2 (two) times a  week.       . ursodiol (ACTIGALL) 300 MG capsule Take 1 capsule (300 mg total) by mouth 2 (two) times daily.  180 capsule  4  . MOVIPREP 100 G SOLR Take 1 kit (200 g total) by mouth once.  1 kit  0   No current facility-administered medications for this visit.   Allergies as of 09/23/2013 - Review Complete 09/23/2013  Allergen Reaction Noted  . Codeine Nausea And Vomiting 06/07/2008  . Penicillins Hives 06/07/2008    reports that she quit smoking about 20 months ago. Her smoking use included Cigarettes. She has a 20 pack-year smoking history. She has never used smokeless tobacco. She reports that she drinks about 6 ounces of alcohol per week. She reports that she does not use illicit drugs.     Review of Systems: Pertinent positive and negative review of systems were noted in the above HPI section. All other review of systems were otherwise negative.  Vital signs were reviewed in today's medical record Physical Exam: General: Well developed , well nourished, no acute distress Skin: anicteric Head: Normocephalic and atraumatic Eyes:  sclerae anicteric, EOMI Ears: Normal auditory acuity Mouth: No deformity or lesions Neck: Supple, no masses or thyromegaly Lungs: Clear throughout to auscultation Heart: Regular rate and rhythm; no murmurs, rubs or bruits Abdomen: Soft, non tender and  non distended. No masses, hepatosplenomegaly or hernias noted. Normal Bowel sounds Rectal:deferred Musculoskeletal: Symmetrical with no gross deformities  Skin: No lesions on visible extremities Pulses:  Normal pulses noted Extremities: No clubbing, cyanosis, edema or deformities noted Neurological: Alert oriented x 4, grossly nonfocal Cervical Nodes:  No significant cervical adenopathy Inguinal Nodes: No significant inguinal adenopathy Psychological:  Alert and cooperative. Normal mood and affect  See Assessment and Plan under Problem List

## 2013-09-23 NOTE — Patient Instructions (Signed)
You have been scheduled for a colonoscopy with propofol. Please follow written instructions given to you at your visit today.  Please pick up your prep kit at the pharmacy within the next 1-3 days. If you use inhalers (even only as needed), please bring them with you on the day of your procedure. Your physician has requested that you go to www.startemmi.com and enter the access code given to you at your visit today. This web site gives a general overview about your procedure. However, you should still follow specific instructions given to you by our office regarding your preparation for the procedure. Your physician has requested that you go to the basement for lab work before leaving today. CC:  Crist Infante MD

## 2013-10-06 ENCOUNTER — Encounter: Payer: Self-pay | Admitting: Gastroenterology

## 2013-10-06 ENCOUNTER — Ambulatory Visit (AMBULATORY_SURGERY_CENTER): Payer: Medicare Other | Admitting: Gastroenterology

## 2013-10-06 VITALS — BP 142/82 | HR 62 | Temp 96.6°F | Resp 16 | Ht 62.5 in | Wt 126.0 lb

## 2013-10-06 DIAGNOSIS — Z85038 Personal history of other malignant neoplasm of large intestine: Secondary | ICD-10-CM

## 2013-10-06 DIAGNOSIS — E7800 Pure hypercholesterolemia, unspecified: Secondary | ICD-10-CM | POA: Diagnosis not present

## 2013-10-06 DIAGNOSIS — K5190 Ulcerative colitis, unspecified, without complications: Secondary | ICD-10-CM | POA: Diagnosis not present

## 2013-10-06 DIAGNOSIS — K5730 Diverticulosis of large intestine without perforation or abscess without bleeding: Secondary | ICD-10-CM

## 2013-10-06 DIAGNOSIS — K6389 Other specified diseases of intestine: Secondary | ICD-10-CM | POA: Diagnosis not present

## 2013-10-06 MED ORDER — SODIUM CHLORIDE 0.9 % IV SOLN: 500.0000 mL | INTRAVENOUS | Status: DC

## 2013-10-06 NOTE — Patient Instructions (Signed)
YOU HAD AN ENDOSCOPIC PROCEDURE TODAY AT THE [facility]: Refer to the procedure report that was given to you for any specific questions about what was found during the examination.  If the procedure report does not answer your questions, please call your gastroenterologist to clarify.  If you requested that your care partner not be given the details of your procedure findings, then the procedure report has been included in a sealed envelope for you to review at your convenience later.  YOU SHOULD EXPECT: Some feelings of bloating in the abdomen. Passage of more gas than usual.  Walking can help get rid of the air that was put into your GI tract during the procedure and reduce the bloating. If you had a lower endoscopy (such as a colonoscopy or flexible sigmoidoscopy) you may notice spotting of blood in your stool or on the toilet paper. If you underwent a bowel prep for your procedure, then you may not have a normal bowel movement for a few days.  DIET: Your first meal following the procedure should be a light meal and then it is ok to progress to your normal diet.  A half-sandwich or bowl of soup is an example of a good first meal.  Heavy or fried foods are harder to digest and may make you feel nauseous or bloated.  Likewise meals heavy in dairy and vegetables can cause extra gas to form and this can also increase the bloating.  Drink plenty of fluids but you should avoid alcoholic beverages for 24 hours.  ACTIVITY: Your care partner should take you home directly after the procedure.  You should plan to take it easy, moving slowly for the rest of the day.  You can resume normal activity the day after the procedure however you should NOT DRIVE or use heavy machinery for 24 hours (because of the sedation medicines used during the test).    SYMPTOMS TO REPORT IMMEDIATELY: A gastroenterologist can be reached at any hour.  During normal business hours, 8:30 AM to 5:00 PM Monday through Friday,  call [phone].  After hours and on weekends, please call the GI answering service at [phone] who will take a message and have the physician on call contact you.   Following lower endoscopy (colonoscopy or flexible sigmoidoscopy):  Excessive amounts of blood in the stool  Significant tenderness or worsening of abdominal pains  Swelling of the abdomen that is new, acute  Fever of 100F or higher    FOLLOW UP: If any biopsies were taken you will be contacted by phone or by letter within the next 1-3 weeks.  Call your gastroenterologist if you have not heard about the biopsies in 3 weeks.  Our staff will call the home number listed on your records the next business day following your procedure to check on you and address any questions or concerns that you may have at that time regarding the information given to you following your procedure. This is a courtesy call and so if there is no answer at the home number and we have not heard from you through the emergency physician on call, we will assume that you have returned to your regular daily activities without incident.  SIGNATURES/CONFIDENTIALITY: You and/or your care partner have signed paperwork which will be entered into your electronic medical record.  These signatures attest to the fact that that the information above on your After Visit Summary has been reviewed and is understood.  Full responsibility of the confidentiality   of this discharge information lies with you and/or your care-partner.     

## 2013-10-06 NOTE — Op Note (Signed)
[place]  [address] [place], [address]   COLONOSCOPY PROCEDURE REPORT  PATIENT: Heidi [name], Heidi [name]  MR#: [id number] BIRTHDATE: 25-May-1934 , 79  yrs. old GENDER: Female ENDOSCOPIST: Inda Castle, MD REFERRED BV:QXIH Perini, M.D. PROCEDURE DATE:  10/06/2013 PROCEDURE:   Colonoscopy with biopsy First Screening Colonoscopy - Avg.  risk and is 50 yrs.  old or older - No.  Prior Negative Screening - Now for repeat screening. N/A  History of Adenoma - Now for follow-up colonoscopy & has been > or = to 3 yrs.  No.  It has been less than 3 yrs since last colonoscopy.  Medical reason.  History of Adenoma - Now for follow-up colonoscopy & has been > or = to 3 yrs.  No.  It has been less than 3 yrs since last colonoscopy.  Other: See Comments Polyps Removed Today? No.  Recommend repeat exam, <10 yrs? Yes. High risk (family or personal hx). ASA CLASS:   Class II INDICATIONS: Ulcerative colitis, colon cancer MEDICATIONS: MAC sedation, administered by CRNA and propofol (Diprivan) 250m IV  DESCRIPTION OF PROCEDURE:   After the risks benefits and alternatives of the procedure were thoroughly explained, informed consent was obtained.  A digital rectal exam revealed no abnormalities of the rectum.   The LB [id number] endoscope was introduced through the anus and advanced to the surgical anastomosis. No adverse events experienced.   The quality of the prep was excellent using Suprep  The instrument was then slowly withdrawn as the colon was fully examined.      COLON FINDINGS: Moderate diverticulosis was noted in the sigmoid colon.   A normal appearing cecum, ileocecal valve, and appendiceal orifice were identified.  The ascending, hepatic flexure, transverse, splenic flexure, descending, sigmoid colon and rectum appeared unremarkable.  No polyps or cancers were seen. Random biopsies were taken approximately every 10 cm to rule out dysplasia. Retroflexed  views revealed no abnormalities. The time to cecum=6 minutes 57 seconds.  Withdrawal time=9 minutes 17 seconds. The scope was withdrawn and the procedure completed. COMPLICATIONS: There were no complications.  ENDOSCOPIC IMPRESSION: 1.   Moderate diverticulosis was noted in the sigmoid colon 2.   Normal colon - ulcer colitis-in remission  RECOMMENDATIONS: yearly colonoscopy  eSigned:  RInda Castle MD 10/06/2013 1:55 PM   cc:   PATIENT NAME:  SAryiana, KlinknerMR#: [id number]

## 2013-10-06 NOTE — Progress Notes (Signed)
A/ox3, pleased with MAC, report to RN 

## 2013-10-06 NOTE — Progress Notes (Signed)
Called to room to assist during endoscopic procedure.  Patient ID and intended procedure confirmed with present staff. Received instructions for my participation in the procedure from the performing physician.  

## 2013-10-07 ENCOUNTER — Telehealth: Payer: Self-pay

## 2013-10-07 NOTE — Telephone Encounter (Signed)
  Follow up Call-  Call back number 10/06/2013 08/05/2012 06/02/2011  Post procedure Call Back phone  # [phone]  Permission to leave phone message Yes Yes Yes     Patient questions:  Do you have a fever, pain , or abdominal swelling? no Pain Score  0 *  Have you tolerated food without any problems? yes  Have you been able to return to your normal activities? yes  Do you have any questions about your discharge instructions: Diet   no Medications  no Follow up visit  no  Do you have questions or concerns about your Care? no  Actions: * If pain score is 4 or above: No action needed, pain <4.

## 2013-10-11 ENCOUNTER — Encounter (INDEPENDENT_AMBULATORY_CARE_PROVIDER_SITE_OTHER): Payer: Self-pay | Admitting: General Surgery

## 2013-10-11 ENCOUNTER — Ambulatory Visit (INDEPENDENT_AMBULATORY_CARE_PROVIDER_SITE_OTHER): Payer: Medicare Other | Admitting: General Surgery

## 2013-10-11 VITALS — BP 124/64 | HR 74 | Temp 98.0°F | Resp 18 | Ht 62.0 in | Wt 127.0 lb

## 2013-10-11 DIAGNOSIS — C180 Malignant neoplasm of cecum: Secondary | ICD-10-CM | POA: Diagnosis not present

## 2013-10-11 NOTE — Patient Instructions (Signed)
Follow up with me in February of next year.  Follow up with oncology in august of this year.  Let us know if you are having issues before then.

## 2013-10-11 NOTE — Assessment & Plan Note (Signed)
One year colonoscopy negative.  Follow up in February of next year, then q year after that.  She has appt with oncology in august of this year to follow up CEA and see if she needs more studies.

## 2013-10-11 NOTE — Progress Notes (Signed)
HISTORY: Patient continues to do well. She is approximately one year status post laparoscopic right colectomy. She does have frequent stools but this has been her normal for many years. She recently underwent her one-year colonoscopy and had no evidence of cancer. She also had no evidence of ulcerative colitis or polyps. Denies any change in appetite. She has had some weight gain, but nothing significant. She does have occasional hemorrhoids with some blood well, but this is also typical for her. She denies any new medical problems.     PERTINENT REVIEW OF SYSTEMS: Otherwise negative x 11.    Filed Vitals:   10/11/13 1111  BP: 124/64  Pulse: 74  Temp: 98 F (36.7 C)  Resp: 18   Filed Weights   10/11/13 1111  Weight: 127 lb (57.607 kg)   EXAM: Unchanged.  Head: Normocephalic and atraumatic.  Eyes:  Conjunctivae are normal. Pupils are equal, round, and reactive to light. No scleral icterus.  Neck:  Normal range of motion. Neck supple. No tracheal deviation present. No thyromegaly present.  Resp: No respiratory distress, normal effort. Abd:  Abdomen is soft, non distended and non tender. No masses are palpable.  There is no rebound and no guarding. No evidence of hernia.   Neurological: Alert and oriented to person, place, and time. Coordination normal.  Skin: Skin is warm and dry. No rash noted. No diaphoretic. No erythema. No pallor.  Psychiatric: Normal mood and affect. Normal behavior. Judgment and thought content normal.      ASSESSMENT AND PLAN:   Cecal cancer One year colonoscopy negative.  Follow up in February of next year, then q year after that.  She has appt with oncology in august of this year to follow up CEA and see if she needs more studies.       Milus Height, MD Surgical Oncology, [facility]  Jerlyn Ly, MD Jerlyn Ly, MD

## 2013-10-12 ENCOUNTER — Encounter: Payer: Self-pay | Admitting: Gastroenterology

## 2013-12-22 ENCOUNTER — Other Ambulatory Visit (HOSPITAL_BASED_OUTPATIENT_CLINIC_OR_DEPARTMENT_OTHER): Payer: Medicare Other

## 2013-12-22 ENCOUNTER — Ambulatory Visit (HOSPITAL_BASED_OUTPATIENT_CLINIC_OR_DEPARTMENT_OTHER): Payer: Medicare Other | Admitting: Oncology

## 2013-12-22 ENCOUNTER — Ambulatory Visit (HOSPITAL_BASED_OUTPATIENT_CLINIC_OR_DEPARTMENT_OTHER): Payer: Medicare Other

## 2013-12-22 VITALS — BP 162/80 | HR 59 | Temp 97.6°F | Resp 18 | Ht 62.0 in | Wt 123.0 lb

## 2013-12-22 DIAGNOSIS — I10 Essential (primary) hypertension: Secondary | ICD-10-CM | POA: Diagnosis not present

## 2013-12-22 DIAGNOSIS — C180 Malignant neoplasm of cecum: Secondary | ICD-10-CM

## 2013-12-22 DIAGNOSIS — Z87891 Personal history of nicotine dependence: Secondary | ICD-10-CM | POA: Diagnosis not present

## 2013-12-22 DIAGNOSIS — K745 Biliary cirrhosis, unspecified: Secondary | ICD-10-CM | POA: Diagnosis not present

## 2013-12-22 DIAGNOSIS — R918 Other nonspecific abnormal finding of lung field: Secondary | ICD-10-CM | POA: Diagnosis not present

## 2013-12-22 LAB — CBC WITH DIFFERENTIAL/PLATELET
BASO%: 0.8 % (ref 0.0–2.0)
Basophils Absolute: 0 10*3/uL (ref 0.0–0.1)
EOS ABS: 0.2 10*3/uL (ref 0.0–0.5)
EOS%: 4.8 % (ref 0.0–7.0)
HCT: 44.4 % (ref 34.8–46.6)
HGB: 14.5 g/dL (ref 11.6–15.9)
LYMPH%: 29.5 % (ref 14.0–49.7)
MCH: 31.3 pg (ref 25.1–34.0)
MCHC: 32.7 g/dL (ref 31.5–36.0)
MCV: 95.7 fL (ref 79.5–101.0)
MONO#: 0.5 10*3/uL (ref 0.1–0.9)
MONO%: 9 % (ref 0.0–14.0)
NEUT%: 55.9 % (ref 38.4–76.8)
NEUTROS ABS: 2.9 10*3/uL (ref 1.5–6.5)
Platelets: 203 10*3/uL (ref 145–400)
RBC: 4.65 10*6/uL (ref 3.70–5.45)
RDW: 13.1 % (ref 11.2–14.5)
WBC: 5.1 10*3/uL (ref 3.9–10.3)
lymph#: 1.5 10*3/uL (ref 0.9–3.3)

## 2013-12-22 LAB — CEA: CEA: 5.7 ng/mL — AB (ref 0.0–5.0)

## 2013-12-22 NOTE — Progress Notes (Signed)
  [facility] OFFICE PROGRESS NOTE   Diagnosis: Colon cancer  INTERVAL HISTORY:   She returns as scheduled. She underwent a colonoscopy 10/06/2013. Diverticulosis was noted in the sigmoid colon. No polyps. Random biopsies reveal benign colonic mucosa and a small serrated polyp. No active inflammation.  She feels well. She reports an occasional cough that she relates to "allergies ".  Objective:  Vital signs in last 24 hours:  Blood pressure 162/80, pulse 59, temperature 97.6 F (36.4 C), temperature source Oral, resp. rate 18, height 5' 2"  (1.575 m), weight 123 lb (55.792 kg).    HEENT: Neck without mass Lymphatics: No cervical, supraclavicular, axillary, or inguinal nodes Resp: Lungs clear bilaterally, no respiratory distress Cardio: Regular rate and rhythm GI: No hepatomegaly, nontender, no mass Vascular: No leg edema   Lab Results:   Lab Results  Component Value Date   CEA 5.9* 06/23/2013     Medications: I have reviewed the patient's current medications.  Assessment/Plan: 1. 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 history of Colon polyps  3. Mildly elevated postoperative CEA , persistently elevated following surgery  PET scan 03/15/2013-negative for metastatic disease 4. History of tobacco use -she reports no tobacco use since September of 2013 5. Ulcerative colitis  6. Primary biliary cirrhosis  7. 4 mm right lung nodule on the PET scan 03/15/2013  8. Hypertension    Disposition:  Ms. Kiesling remains in remission from colon cancer. We will followup on the CEA from today. She will return for an office visit and CEA in 6 months.  Betsy Coder, MD  12/22/2013  9:28 AM

## 2013-12-23 ENCOUNTER — Telehealth: Payer: Self-pay | Admitting: Oncology

## 2013-12-23 NOTE — Telephone Encounter (Signed)
s.w. pt and advised on Feb 2016 appt .Marland Kitchen..pt ok and aware

## 2013-12-28 ENCOUNTER — Telehealth: Payer: Self-pay | Admitting: *Deleted

## 2013-12-28 NOTE — Telephone Encounter (Signed)
Left message on voicemail for pt to call office for lab results. (CEA stable, per Dr. Benay Spice.)

## 2013-12-28 NOTE — Telephone Encounter (Signed)
Message copied by Brien Few on Wed Dec 28, 2013 11:45 AM ------      Message from: Betsy Coder B      Created: Thu Dec 22, 2013  7:09 PM       Please call patient, cea is stable, f/u as scheduled ------

## 2013-12-29 ENCOUNTER — Telehealth: Payer: Self-pay | Admitting: *Deleted

## 2013-12-29 NOTE — Telephone Encounter (Signed)
Left VM returning our call from earlier this week.  Called back and gave her stable CEA results.

## 2014-01-06 ENCOUNTER — Other Ambulatory Visit: Payer: Self-pay | Admitting: Gastroenterology

## 2014-01-12 ENCOUNTER — Encounter: Payer: Self-pay | Admitting: Gastroenterology

## 2014-01-31 DIAGNOSIS — Z23 Encounter for immunization: Secondary | ICD-10-CM | POA: Diagnosis not present

## 2014-03-09 DIAGNOSIS — Z008 Encounter for other general examination: Secondary | ICD-10-CM | POA: Diagnosis not present

## 2014-03-09 DIAGNOSIS — M859 Disorder of bone density and structure, unspecified: Secondary | ICD-10-CM | POA: Diagnosis not present

## 2014-03-09 DIAGNOSIS — M8580 Other specified disorders of bone density and structure, unspecified site: Secondary | ICD-10-CM | POA: Diagnosis not present

## 2014-03-09 DIAGNOSIS — R8299 Other abnormal findings in urine: Secondary | ICD-10-CM | POA: Diagnosis not present

## 2014-03-09 DIAGNOSIS — E538 Deficiency of other specified B group vitamins: Secondary | ICD-10-CM | POA: Diagnosis not present

## 2014-03-09 DIAGNOSIS — E785 Hyperlipidemia, unspecified: Secondary | ICD-10-CM | POA: Diagnosis not present

## 2014-03-09 DIAGNOSIS — I10 Essential (primary) hypertension: Secondary | ICD-10-CM | POA: Diagnosis not present

## 2014-03-16 DIAGNOSIS — F17200 Nicotine dependence, unspecified, uncomplicated: Secondary | ICD-10-CM | POA: Diagnosis not present

## 2014-03-16 DIAGNOSIS — E785 Hyperlipidemia, unspecified: Secondary | ICD-10-CM | POA: Diagnosis not present

## 2014-03-16 DIAGNOSIS — K449 Diaphragmatic hernia without obstruction or gangrene: Secondary | ICD-10-CM | POA: Diagnosis not present

## 2014-03-16 DIAGNOSIS — C189 Malignant neoplasm of colon, unspecified: Secondary | ICD-10-CM | POA: Diagnosis not present

## 2014-03-16 DIAGNOSIS — Z0000 Encounter for general adult medical examination without abnormal findings: Secondary | ICD-10-CM | POA: Diagnosis not present

## 2014-03-16 DIAGNOSIS — E538 Deficiency of other specified B group vitamins: Secondary | ICD-10-CM | POA: Diagnosis not present

## 2014-03-16 DIAGNOSIS — R809 Proteinuria, unspecified: Secondary | ICD-10-CM | POA: Diagnosis not present

## 2014-03-16 DIAGNOSIS — Z1389 Encounter for screening for other disorder: Secondary | ICD-10-CM | POA: Diagnosis not present

## 2014-03-16 DIAGNOSIS — M1990 Unspecified osteoarthritis, unspecified site: Secondary | ICD-10-CM | POA: Diagnosis not present

## 2014-03-16 DIAGNOSIS — I10 Essential (primary) hypertension: Secondary | ICD-10-CM | POA: Diagnosis not present

## 2014-03-24 ENCOUNTER — Ambulatory Visit (INDEPENDENT_AMBULATORY_CARE_PROVIDER_SITE_OTHER): Payer: Medicare Other | Admitting: Ophthalmology

## 2014-03-24 DIAGNOSIS — I10 Essential (primary) hypertension: Secondary | ICD-10-CM

## 2014-03-24 DIAGNOSIS — H3531 Nonexudative age-related macular degeneration: Secondary | ICD-10-CM

## 2014-03-24 DIAGNOSIS — H43813 Vitreous degeneration, bilateral: Secondary | ICD-10-CM | POA: Diagnosis not present

## 2014-03-24 DIAGNOSIS — H35033 Hypertensive retinopathy, bilateral: Secondary | ICD-10-CM

## 2014-03-28 DIAGNOSIS — M8588 Other specified disorders of bone density and structure, other site: Secondary | ICD-10-CM | POA: Diagnosis not present

## 2014-03-28 DIAGNOSIS — Z23 Encounter for immunization: Secondary | ICD-10-CM | POA: Diagnosis not present

## 2014-04-04 DIAGNOSIS — Z124 Encounter for screening for malignant neoplasm of cervix: Secondary | ICD-10-CM | POA: Diagnosis not present

## 2014-04-04 DIAGNOSIS — Z01419 Encounter for gynecological examination (general) (routine) without abnormal findings: Secondary | ICD-10-CM | POA: Diagnosis not present

## 2014-04-04 DIAGNOSIS — Z1231 Encounter for screening mammogram for malignant neoplasm of breast: Secondary | ICD-10-CM | POA: Diagnosis not present

## 2014-04-04 DIAGNOSIS — R350 Frequency of micturition: Secondary | ICD-10-CM | POA: Diagnosis not present

## 2014-06-02 ENCOUNTER — Other Ambulatory Visit: Payer: Self-pay | Admitting: Gastroenterology

## 2014-06-16 ENCOUNTER — Ambulatory Visit (HOSPITAL_BASED_OUTPATIENT_CLINIC_OR_DEPARTMENT_OTHER): Payer: Medicare Other | Admitting: Oncology

## 2014-06-16 ENCOUNTER — Telehealth: Payer: Self-pay | Admitting: Oncology

## 2014-06-16 ENCOUNTER — Telehealth: Payer: Self-pay | Admitting: *Deleted

## 2014-06-16 ENCOUNTER — Other Ambulatory Visit: Payer: Medicare Other

## 2014-06-16 VITALS — BP 126/90 | HR 61 | Temp 97.6°F | Resp 18 | Ht 62.0 in | Wt 126.8 lb

## 2014-06-16 DIAGNOSIS — C180 Malignant neoplasm of cecum: Secondary | ICD-10-CM

## 2014-06-16 DIAGNOSIS — Z85038 Personal history of other malignant neoplasm of large intestine: Secondary | ICD-10-CM

## 2014-06-16 DIAGNOSIS — R918 Other nonspecific abnormal finding of lung field: Secondary | ICD-10-CM | POA: Diagnosis not present

## 2014-06-16 LAB — CEA: CEA: 6.5 ng/mL — AB (ref 0.0–5.0)

## 2014-06-16 NOTE — Progress Notes (Signed)
  [facility] OFFICE PROGRESS NOTE   Diagnosis: Colon cancer  INTERVAL HISTORY:   Heidi [name] returns as scheduled. She feels well. No difficulty with bowel function. Good appetite.  Objective:  Vital signs in last 24 hours:  Blood pressure 163/90, pulse 61, temperature 97.6 F (36.4 C), temperature source Oral, resp. rate 18, height 5' 2"  (1.575 m), weight 126 lb 12.8 oz (57.516 kg).    HEENT: Neck without mass Lymphatics: No cervical, supra-clavicular, axillary, or inguinal nodes Resp: Lungs clear bilaterally Cardio: Regular rate and rhythm GI: No hepatomegaly, no mass, mild tenderness in the right lower abdomen. Vascular: No leg edema   Lab Results:    Lab Results  Component Value Date   CEA 6.5* 06/16/2014    Imaging:  No results found.  Medications: I have reviewed the patient's current medications.  Assessment/Plan: 1. 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 history of Colon polyps  3. Mildly elevated postoperative CEA , persistently elevated following surgery   PET scan 03/15/2013-negative for metastatic disease 4. History of tobacco use -she reports no tobacco use since September of 2013 5. Ulcerative colitis  6. Primary biliary cirrhosis  7. 4 mm right lung nodule on the PET scan 03/15/2013 -reviewed with radiology, follow-up CT not recommended 8. Hypertension    Disposition:  She remains in clinical remission from colon cancer. The CEA is mildly elevated and unchanged over the past few years. She will retu rn for an office visit and CEA in 6 months. I doubt the mild elevation of the CEA is related to her history of colon cancer. She had tenderness in the right low abdomen on exam today. She will contact us for abdominal pain or progressive tenderness. Betsy Coder, MD  06/16/2014  2:26  PM

## 2014-06-16 NOTE — Telephone Encounter (Signed)
gv adn printed appt sched anda vs for pt for Aug

## 2014-06-16 NOTE — Telephone Encounter (Signed)
-----   Message from Ladell Pier, MD sent at 06/16/2014  1:45 PM EST ----- Please call patient, cea is stable

## 2014-06-19 ENCOUNTER — Telehealth: Payer: Self-pay | Admitting: *Deleted

## 2014-06-19 NOTE — Telephone Encounter (Signed)
Pt returned call from message left Friday 06/16/14; per Dr Benay Spice notified pt cea is stable.  Pt verbalized understanding and confirmed future appts.

## 2014-06-19 NOTE — Telephone Encounter (Signed)
-----   Message from Ladell Pier, MD sent at 06/16/2014  1:45 PM EST ----- Please call patient, cea is stable

## 2014-06-20 DIAGNOSIS — L570 Actinic keratosis: Secondary | ICD-10-CM | POA: Diagnosis not present

## 2014-06-20 DIAGNOSIS — Z85038 Personal history of other malignant neoplasm of large intestine: Secondary | ICD-10-CM | POA: Diagnosis not present

## 2014-06-20 DIAGNOSIS — Z85828 Personal history of other malignant neoplasm of skin: Secondary | ICD-10-CM | POA: Diagnosis not present

## 2014-06-20 DIAGNOSIS — R970 Elevated carcinoembryonic antigen [CEA]: Secondary | ICD-10-CM | POA: Diagnosis not present

## 2014-08-03 ENCOUNTER — Encounter: Payer: Self-pay | Admitting: Gastroenterology

## 2014-08-09 ENCOUNTER — Encounter: Payer: Self-pay | Admitting: Gastroenterology

## 2014-09-01 DIAGNOSIS — H903 Sensorineural hearing loss, bilateral: Secondary | ICD-10-CM | POA: Diagnosis not present

## 2014-10-13 ENCOUNTER — Ambulatory Visit (AMBULATORY_SURGERY_CENTER): Payer: Self-pay | Admitting: *Deleted

## 2014-10-13 VITALS — Ht 62.0 in | Wt 127.0 lb

## 2014-10-13 DIAGNOSIS — Z85038 Personal history of other malignant neoplasm of large intestine: Secondary | ICD-10-CM

## 2014-10-13 MED ORDER — NA SULFATE-K SULFATE-MG SULF 17.5-3.13-1.6 GM/177ML PO SOLN: 1.0000 | Freq: Once | ORAL | Status: DC

## 2014-10-13 NOTE — Progress Notes (Signed)
No egg or soy allergy No issues with past sedation-2015 propofol with no issues  No diet pills No home 02 use

## 2014-10-27 ENCOUNTER — Ambulatory Visit (AMBULATORY_SURGERY_CENTER): Payer: Medicare Other | Admitting: Gastroenterology

## 2014-10-27 ENCOUNTER — Encounter: Payer: Self-pay | Admitting: Gastroenterology

## 2014-10-27 VITALS — BP 145/86 | HR 68 | Temp 96.7°F | Resp 18 | Ht 62.0 in | Wt 127.0 lb

## 2014-10-27 DIAGNOSIS — K5150 Left sided colitis without complications: Secondary | ICD-10-CM

## 2014-10-27 DIAGNOSIS — Z85038 Personal history of other malignant neoplasm of large intestine: Secondary | ICD-10-CM

## 2014-10-27 DIAGNOSIS — K7460 Unspecified cirrhosis of liver: Secondary | ICD-10-CM | POA: Diagnosis not present

## 2014-10-27 DIAGNOSIS — K5730 Diverticulosis of large intestine without perforation or abscess without bleeding: Secondary | ICD-10-CM

## 2014-10-27 DIAGNOSIS — I10 Essential (primary) hypertension: Secondary | ICD-10-CM | POA: Diagnosis not present

## 2014-10-27 DIAGNOSIS — R002 Palpitations: Secondary | ICD-10-CM | POA: Diagnosis not present

## 2014-10-27 MED ORDER — SODIUM CHLORIDE 0.9 % IV SOLN: 500.0000 mL | INTRAVENOUS | Status: DC

## 2014-10-27 NOTE — Op Note (Signed)
[place]  [address] [place], [address]   COLONOSCOPY PROCEDURE REPORT  PATIENT: Heidi [name], Heidi [name]  MR#: [id number] BIRTHDATE: Aug 02, 1934 , 80  yrs. old GENDER: female ENDOSCOPIST: Inda Castle, MD REFERRED VW:AQLR Perini, M.D. PROCEDURE DATE:  10/27/2014 PROCEDURE:   Colonoscopy, surveillance First Screening Colonoscopy - Avg.  risk and is 50 yrs.  old or older - No.  Prior Negative Screening - Now for repeat screening. N/A  History of Adenoma - Now for follow-up colonoscopy & has been > or = to 3 yrs.  No.  It has been less than 3 yrs since last colonoscopy.  Medical reason.  Polyps removed today? No ASA CLASS:   Class II INDICATIONS:high risk patient with personal history of colon cancer and high risk patient with previously diagnosed UC left-sided colitis. MEDICATIONS: Monitored anesthesia care and Propofol 160 mg IV  DESCRIPTION OF PROCEDURE:   After the risks benefits and alternatives of the procedure were thoroughly explained, informed consent was obtained.  The digital rectal exam revealed no abnormalities of the rectum.   The LB [id number]  endoscope was introduced through the anus and advanced to the surgical anastomosis. No adverse events experienced.   The quality of the prep was (Suprep was used) excellent.  The instrument was then slowly withdrawn as the colon was fully examined. Estimated blood loss is zero unless otherwise noted in this procedure report.      COLON FINDINGS: There was moderate diverticulosis noted in the descending colon and sigmoid colon.  Retroflexed views revealed no abnormalities. The time to cecum = 4.2 Withdrawal time = 5.8   The scope was withdrawn and the procedure completed. COMPLICATIONS: There were no immediate complications.  ENDOSCOPIC IMPRESSION: Moderate diverticulosis was noted in the descending colon and sigmoid colon  RECOMMENDATIONS: 1.  Await biopsy results 2.  Repeat Colonoscopy in 1  year.  eSigned:  Inda Castle, MD 10/27/2014 2:34 PM   cc:

## 2014-10-27 NOTE — Patient Instructions (Signed)
Impressions/recommendations:  Diverticulosis (handout given) High Fiber diet (handout given)  YOU HAD AN ENDOSCOPIC PROCEDURE TODAY AT [facility]:   Refer to the procedure report that was given to you for any specific questions about what was found during the examination.  If the procedure report does not answer your questions, please call your gastroenterologist to clarify.  If you requested that your care partner not be given the details of your procedure findings, then the procedure report has been included in a sealed envelope for you to review at your convenience later.  YOU SHOULD EXPECT: Some feelings of bloating in the abdomen. Passage of more gas than usual.  Walking can help get rid of the air that was put into your GI tract during the procedure and reduce the bloating. If you had a lower endoscopy (such as a colonoscopy or flexible sigmoidoscopy) you may notice spotting of blood in your stool or on the toilet paper. If you underwent a bowel prep for your procedure, you may not have a normal bowel movement for a few days.  Please Note:  You might notice some irritation and congestion in your nose or some drainage.  This is from the oxygen used during your procedure.  There is no need for concern and it should clear up in a day or so.  SYMPTOMS TO REPORT IMMEDIATELY:   Following lower endoscopy (colonoscopy or flexible sigmoidoscopy):  Excessive amounts of blood in the stool  Significant tenderness or worsening of abdominal pains  Swelling of the abdomen that is new, acute  Fever of 100F or higher  For urgent or emergent issues, a gastroenterologist can be reached at any hour by calling [phone].   DIET: Your first meal following the procedure should be a small meal and then it is ok to progress to your normal diet. Heavy or fried foods are harder to digest and may make you feel nauseous or bloated.  Likewise, meals heavy in dairy and vegetables can increase  bloating.  Drink plenty of fluids but you should avoid alcoholic beverages for 24 hours.  ACTIVITY:  You should plan to take it easy for the rest of today and you should NOT DRIVE or use heavy machinery until tomorrow (because of the sedation medicines used during the test).    FOLLOW UP: Our staff will call the number listed on your records the next business day following your procedure to check on you and address any questions or concerns that you may have regarding the information given to you following your procedure. If we do not reach you, we will leave a message.  However, if you are feeling well and you are not experiencing any problems, there is no need to return our call.  We will assume that you have returned to your regular daily activities without incident.  If any biopsies were taken you will be contacted by phone or by letter within the next 1-3 weeks.  Please call us at [phone] if you have not heard about the biopsies in 3 weeks.    SIGNATURES/CONFIDENTIALITY: You and/or your care partner have signed paperwork which will be entered into your electronic medical record.  These signatures attest to the fact that that the information above on your After Visit Summary has been reviewed and is understood.  Full responsibility of the confidentiality of this discharge information lies with you and/or your care-partner.

## 2014-10-27 NOTE — Progress Notes (Signed)
Report to PACU, RN, vss, BBS= Clear.  

## 2014-10-27 NOTE — Progress Notes (Signed)
Called to room to assist during endoscopic procedure.  Patient ID and intended procedure confirmed with present staff. Received instructions for my participation in the procedure from the performing physician.  

## 2014-10-30 ENCOUNTER — Telehealth: Payer: Self-pay | Admitting: *Deleted

## 2014-10-30 ENCOUNTER — Telehealth: Payer: Self-pay | Admitting: Gastroenterology

## 2014-10-30 NOTE — Telephone Encounter (Signed)
FYI-top of the hand is sore at the IV site. Some redness. No knot or hard place per the patient. She has been typing all day, but states there is actually some improvement from the weekend. Advised her to elevate and use warm moist compresses. She will call us tomorrow with an update.

## 2014-10-30 NOTE — Telephone Encounter (Signed)
  Follow up Call-  Call back number 10/27/2014 10/06/2013 08/05/2012  Post procedure Call Back phone  # [phone]  Permission to leave phone message Yes Yes Yes     Patient questions:  Do you have a fever, pain , or abdominal swelling? No. Pain Score  0 *  Have you tolerated food without any problems? Yes.    Have you been able to return to your normal activities? Yes.    Do you have any questions about your discharge instructions: Diet   No. Medications  No. Follow up visit  No.  Do you have questions or concerns about your Care? No.  Actions: * If pain score is 4 or above: No action needed, pain <4.

## 2014-11-02 ENCOUNTER — Encounter: Payer: Self-pay | Admitting: Gastroenterology

## 2014-12-19 ENCOUNTER — Telehealth: Payer: Self-pay | Admitting: Oncology

## 2014-12-19 ENCOUNTER — Ambulatory Visit (HOSPITAL_BASED_OUTPATIENT_CLINIC_OR_DEPARTMENT_OTHER): Payer: Medicare Other | Admitting: Oncology

## 2014-12-19 ENCOUNTER — Other Ambulatory Visit (HOSPITAL_BASED_OUTPATIENT_CLINIC_OR_DEPARTMENT_OTHER): Payer: Medicare Other

## 2014-12-19 VITALS — BP 148/76 | HR 62 | Temp 97.7°F | Resp 18 | Ht 62.0 in | Wt 125.7 lb

## 2014-12-19 DIAGNOSIS — R911 Solitary pulmonary nodule: Secondary | ICD-10-CM

## 2014-12-19 DIAGNOSIS — R970 Elevated carcinoembryonic antigen [CEA]: Secondary | ICD-10-CM

## 2014-12-19 DIAGNOSIS — Z85038 Personal history of other malignant neoplasm of large intestine: Secondary | ICD-10-CM | POA: Diagnosis present

## 2014-12-19 DIAGNOSIS — K743 Primary biliary cirrhosis: Secondary | ICD-10-CM

## 2014-12-19 DIAGNOSIS — Z87891 Personal history of nicotine dependence: Secondary | ICD-10-CM | POA: Diagnosis not present

## 2014-12-19 DIAGNOSIS — C180 Malignant neoplasm of cecum: Secondary | ICD-10-CM

## 2014-12-19 DIAGNOSIS — I10 Essential (primary) hypertension: Secondary | ICD-10-CM | POA: Diagnosis not present

## 2014-12-19 NOTE — Telephone Encounter (Signed)
per pof to sch pt appt-gave pt copy of avs °

## 2014-12-19 NOTE — Progress Notes (Signed)
  [facility] OFFICE PROGRESS NOTE   Diagnosis: Colon cancer  INTERVAL HISTORY:   Heidi [name] returns as scheduled. She feels well. She is working. Good appetite. No difficult with bowel function. She reports occasional dysphagia and regurgitation of food. This is been a problem chronically and she reports undergoing an esophagus dilation procedure in the past. She underwent a colonoscopy 10/27/2014. Moderate diverticulosis was noted in the descending and sigmoid colon. Random biopsies were negative for dysplasia or malignancy. She quit smoking in 2013 after smoking approximately three quarters packs of cigarettes for 50 years. Objective:  Vital signs in last 24 hours:  Blood pressure 148/76, pulse 62, temperature 97.7 F (36.5 C), temperature source Oral, resp. rate 18, height 5' 2"  (1.575 m), weight 125 lb 11.2 oz (57.017 kg), SpO2 100 %.    HEENT: Neck without mass Lymphatics: No cervical, supra-clavicular, axillary, or inguinal nodes Resp: Lungs clear bilaterally Cardio: Regular rate and rhythm GI: No hepatomegaly, no spinal megaly, no mass Vascular: No leg edema   Lab Results:   Lab Results  Component Value Date   CEA 6.5* 06/16/2014    Imaging:  No results found.  Medications: I have reviewed the patient's current medications.  Assessment/Plan: 1. 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 history of Colon polyps  3. Mildly elevated postoperative CEA , persistently elevated following surgery   PET scan 03/15/2013-negative for metastatic disease 4. History of tobacco use -she reports no tobacco use since September of 2013 5. Ulcerative colitis  6. Primary biliary cirrhosis  7. 4 mm right lung nodule on the PET scan 03/15/2013 -reviewed with radiology, follow-up  CT not recommended 8. Hypertension    Disposition:  Heidi [name] remains in clinical remission from colon cancer. We will follow-up on the CEA from today. I suspect the chronic mild elevation of the CEA could be related to biliary cirrhosis. She has a smoking history and appears to qualify for the lung cancer screening program. We will refer to the screening navigator. She would like to continue follow-up at the [facility]. She will return for an office visit and CEA in one year. Betsy Coder, MD  12/19/2014  4:10 PM

## 2014-12-20 ENCOUNTER — Encounter: Payer: Self-pay | Admitting: *Deleted

## 2014-12-20 LAB — CEA: CEA: 5.3 ng/mL — ABNORMAL HIGH (ref 0.0–5.0)

## 2014-12-20 NOTE — Progress Notes (Signed)
Oncology Nurse Navigator Documentation  Oncology Nurse Navigator Flowsheets 12/20/2014  Navigator Encounter Type Other/Dr. Benay Spice request patient be referred to LDCT screening clinic.  I Kris Mouton, clinic NP  Interventions Coordination of Care  Time Spent with Patient 15

## 2014-12-21 ENCOUNTER — Telehealth: Payer: Self-pay | Admitting: *Deleted

## 2014-12-21 NOTE — Telephone Encounter (Signed)
-----   Message from Ladell Pier, MD sent at 12/20/2014  4:11 PM EDT ----- Please call patient, cea is stable She does not qualify for lung cancer screening program due to recent diagnosis of colon cancer, f/u as scheduled

## 2014-12-21 NOTE — Telephone Encounter (Signed)
Per Dr. Benay Spice; notified pt that cea is stable; did not qualify for lung cancer screening program due to recent diagnosis of colon cancer.  Pt verbalized understanding and will call for any problems; f/u as scheduled.

## 2015-01-05 ENCOUNTER — Telehealth: Payer: Self-pay | Admitting: *Deleted

## 2015-01-05 MED ORDER — URSODIOL 300 MG PO CAPS: 300.0000 mg | ORAL_CAPSULE | Freq: Two times a day (BID) | ORAL | Status: DC

## 2015-01-05 MED ORDER — MESALAMINE 1.2 G PO TBEC: 2400.0000 mg | DELAYED_RELEASE_TABLET | Freq: Every day | ORAL | Status: DC

## 2015-01-05 NOTE — Telephone Encounter (Signed)
[facility]   Patient needs office appointment only gave 1 refill

## 2015-01-27 DIAGNOSIS — Z23 Encounter for immunization: Secondary | ICD-10-CM | POA: Diagnosis not present

## 2015-02-19 ENCOUNTER — Telehealth: Payer: Self-pay | Admitting: Gastroenterology

## 2015-02-19 NOTE — Telephone Encounter (Signed)
This patient will need to schedule an an appointment to establish care with a new Heidi [name] and refills will have to be approved by that [name] before this can be refilled

## 2015-02-23 ENCOUNTER — Encounter: Payer: Self-pay | Admitting: *Deleted

## 2015-02-23 NOTE — Telephone Encounter (Signed)
error 

## 2015-02-23 NOTE — Telephone Encounter (Signed)
Called and left message for patient that she needs to establish care with one of our other providers and make an appointment so we can refill her Omeprazole

## 2015-03-16 DIAGNOSIS — E538 Deficiency of other specified B group vitamins: Secondary | ICD-10-CM | POA: Diagnosis not present

## 2015-03-16 DIAGNOSIS — M859 Disorder of bone density and structure, unspecified: Secondary | ICD-10-CM | POA: Diagnosis not present

## 2015-03-16 DIAGNOSIS — N390 Urinary tract infection, site not specified: Secondary | ICD-10-CM | POA: Diagnosis not present

## 2015-03-16 DIAGNOSIS — E785 Hyperlipidemia, unspecified: Secondary | ICD-10-CM | POA: Diagnosis not present

## 2015-03-16 DIAGNOSIS — I10 Essential (primary) hypertension: Secondary | ICD-10-CM | POA: Diagnosis not present

## 2015-03-16 DIAGNOSIS — R8299 Other abnormal findings in urine: Secondary | ICD-10-CM | POA: Diagnosis not present

## 2015-03-21 DIAGNOSIS — E785 Hyperlipidemia, unspecified: Secondary | ICD-10-CM | POA: Diagnosis not present

## 2015-03-21 DIAGNOSIS — H3530 Unspecified macular degeneration: Secondary | ICD-10-CM | POA: Diagnosis not present

## 2015-03-21 DIAGNOSIS — Z1389 Encounter for screening for other disorder: Secondary | ICD-10-CM | POA: Diagnosis not present

## 2015-03-21 DIAGNOSIS — K449 Diaphragmatic hernia without obstruction or gangrene: Secondary | ICD-10-CM | POA: Diagnosis not present

## 2015-03-21 DIAGNOSIS — R809 Proteinuria, unspecified: Secondary | ICD-10-CM | POA: Diagnosis not present

## 2015-03-21 DIAGNOSIS — C189 Malignant neoplasm of colon, unspecified: Secondary | ICD-10-CM | POA: Diagnosis not present

## 2015-03-21 DIAGNOSIS — E538 Deficiency of other specified B group vitamins: Secondary | ICD-10-CM | POA: Diagnosis not present

## 2015-03-21 DIAGNOSIS — M79606 Pain in leg, unspecified: Secondary | ICD-10-CM | POA: Diagnosis not present

## 2015-03-21 DIAGNOSIS — Z6822 Body mass index (BMI) 22.0-22.9, adult: Secondary | ICD-10-CM | POA: Diagnosis not present

## 2015-03-21 DIAGNOSIS — Z0000 Encounter for general adult medical examination without abnormal findings: Secondary | ICD-10-CM | POA: Diagnosis not present

## 2015-03-21 DIAGNOSIS — Z1231 Encounter for screening mammogram for malignant neoplasm of breast: Secondary | ICD-10-CM | POA: Diagnosis not present

## 2015-03-21 DIAGNOSIS — I10 Essential (primary) hypertension: Secondary | ICD-10-CM | POA: Diagnosis not present

## 2015-03-26 ENCOUNTER — Ambulatory Visit (INDEPENDENT_AMBULATORY_CARE_PROVIDER_SITE_OTHER)
Admission: RE | Admit: 2015-03-26 | Discharge: 2015-03-26 | Disposition: A | Discharge status: Home / Self Care | Payer: Medicare Other | Source: Ambulatory Visit | Attending: Surgery | Admitting: Surgery

## 2015-03-26 ENCOUNTER — Other Ambulatory Visit (HOSPITAL_COMMUNITY): Payer: Self-pay | Admitting: Internal Medicine

## 2015-03-26 ENCOUNTER — Ambulatory Visit (HOSPITAL_COMMUNITY)
Admission: RE | Admit: 2015-03-26 | Discharge: 2015-03-26 | Disposition: A | Discharge status: Home / Self Care | Payer: Medicare Other | Source: Ambulatory Visit | Attending: Surgery | Admitting: Surgery

## 2015-03-26 DIAGNOSIS — I739 Peripheral vascular disease, unspecified: Secondary | ICD-10-CM

## 2015-03-27 ENCOUNTER — Ambulatory Visit (INDEPENDENT_AMBULATORY_CARE_PROVIDER_SITE_OTHER): Payer: Medicare Other | Admitting: Ophthalmology

## 2015-03-27 DIAGNOSIS — I872 Venous insufficiency (chronic) (peripheral): Secondary | ICD-10-CM | POA: Diagnosis not present

## 2015-03-27 DIAGNOSIS — L0390 Cellulitis, unspecified: Secondary | ICD-10-CM | POA: Diagnosis not present

## 2015-03-27 DIAGNOSIS — H353132 Nonexudative age-related macular degeneration, bilateral, intermediate dry stage: Secondary | ICD-10-CM

## 2015-03-27 DIAGNOSIS — H43813 Vitreous degeneration, bilateral: Secondary | ICD-10-CM

## 2015-03-27 DIAGNOSIS — H35033 Hypertensive retinopathy, bilateral: Secondary | ICD-10-CM | POA: Diagnosis not present

## 2015-03-27 DIAGNOSIS — M79606 Pain in leg, unspecified: Secondary | ICD-10-CM | POA: Diagnosis not present

## 2015-03-27 DIAGNOSIS — I10 Essential (primary) hypertension: Secondary | ICD-10-CM | POA: Diagnosis not present

## 2015-04-16 DIAGNOSIS — Z6823 Body mass index (BMI) 23.0-23.9, adult: Secondary | ICD-10-CM | POA: Diagnosis not present

## 2015-04-16 DIAGNOSIS — R05 Cough: Secondary | ICD-10-CM | POA: Diagnosis not present

## 2015-04-16 DIAGNOSIS — J0100 Acute maxillary sinusitis, unspecified: Secondary | ICD-10-CM | POA: Diagnosis not present

## 2015-04-16 DIAGNOSIS — I10 Essential (primary) hypertension: Secondary | ICD-10-CM | POA: Diagnosis not present

## 2015-05-09 ENCOUNTER — Encounter: Payer: Self-pay | Admitting: Gastroenterology

## 2015-05-09 ENCOUNTER — Other Ambulatory Visit: Payer: Self-pay | Admitting: *Deleted

## 2015-05-09 ENCOUNTER — Ambulatory Visit (INDEPENDENT_AMBULATORY_CARE_PROVIDER_SITE_OTHER): Payer: Medicare Other | Admitting: Gastroenterology

## 2015-05-09 ENCOUNTER — Other Ambulatory Visit (INDEPENDENT_AMBULATORY_CARE_PROVIDER_SITE_OTHER): Payer: Medicare Other

## 2015-05-09 VITALS — BP 152/84 | HR 72 | Ht 62.0 in | Wt 128.1 lb

## 2015-05-09 DIAGNOSIS — Z85038 Personal history of other malignant neoplasm of large intestine: Secondary | ICD-10-CM | POA: Diagnosis not present

## 2015-05-09 DIAGNOSIS — C180 Malignant neoplasm of cecum: Secondary | ICD-10-CM

## 2015-05-09 DIAGNOSIS — K219 Gastro-esophageal reflux disease without esophagitis: Secondary | ICD-10-CM

## 2015-05-09 DIAGNOSIS — K743 Primary biliary cirrhosis: Secondary | ICD-10-CM

## 2015-05-09 DIAGNOSIS — K51919 Ulcerative colitis, unspecified with unspecified complications: Secondary | ICD-10-CM | POA: Diagnosis not present

## 2015-05-09 LAB — HEPATIC FUNCTION PANEL
ALT: 40 U/L — ABNORMAL HIGH (ref 0–35)
AST: 33 U/L (ref 0–37)
Albumin: 3.8 g/dL (ref 3.5–5.2)
Alkaline Phosphatase: 63 U/L (ref 39–117)
Bilirubin, Direct: 0.1 mg/dL (ref 0.0–0.3)
Total Bilirubin: 0.7 mg/dL (ref 0.2–1.2)
Total Protein: 6.8 g/dL (ref 6.0–8.3)

## 2015-05-09 NOTE — Patient Instructions (Signed)
Your physician has requested that you go to the basement for lab work before leaving today.

## 2015-05-09 NOTE — Progress Notes (Signed)
HPI :  81 y/o female here for follow up for multiple issues. History of left sided UC, colon cancer (cecum 2014), and history of PBC. New patient to me, former patient of Dr. Deatra Ina.   UC - left sided disease, diagnosed around she thinks 12 years or so, but had symptoms of rectal bleeding for several years prior to diagnosis. She takes Lialda 2.4gm daily. She has formed stools, multiple stools per day which varies. She thinks she has higher frequency since her colon resection for colon cancer but has been overall stable. No blood in the stools. Her last Hgb 15.2 in November. She has not been on anything else for colitis historially, no prior hospitalizations. Last colonoscopy in 2016 showed remission.   Colon cancer - cecum, diagnosed in 2014, and had surgical resection for this. She has had colonoscopy in 2015 and 2016 since diagnosis. No polyps removed. CEA level has remained mildly elevated although downtrending as of this past August.   PBC - diagnosed via liver biopsy around 2008. She is taking 311m BID for this. She weighs 127lbs or so (57.5kg). She had recent labs at primary care showing the following:  AST of 72 and ALT of 116 as of November. AP was 69 and T bil 0.8. She reports being on antibiotics during time of last blood work and not sure if it is related. Her vitamin D was 40.9. She has been diagnosed with osteopenia previously. She had a normal TSH in November. Last CBC showed normal platelets in November 2016. She had a normal MRCP in 2008, no evidence of PSC. Last [place] in 2008.  GERD - patient has history of GERD and required dilation in the past for dysphagia. She is taking prilosec once daily which works well for her. No dysphagia at present time.      Past Medical History  Diagnosis Date  . Diverticulosis of colon   . GERD (gastroesophageal reflux disease)   . Allergy   . Macular degeneration   . Heart murmur   . Osteoporosis     osteopenia  . Ulcerative colitis     . Cir[place]osis, [place]   . High cholesterol   . Dysrhythmia     "skips beats" (09/15/2012)  . Arthritis     "a little; joints" (09/15/2012)  . Chronic lower back pain   . Basal cell carcinoma of forehead ~ 2008    "lasered/burned off" (09/15/2012)  . Hx of colon cancer, stage I      Past Surgical History  Procedure Laterality Date  . Tubal ligation  1973  . Tonsillectomy  1952  . Knee arthroscopy Right 1990's  . Colonoscopy    . Laparoscopic right colon resection  09/15/2012  . Cataract extraction w/ intraocular lens  implant, bilateral Bilateral ~ 2008  . Laparoscopic partial colectomy N/A 09/15/2012    Procedure: LAPAROSCOPIC ILEOCOLECTOMY;  Surgeon: FStark Klein MD;  Location: [facility] OR;  Service: General;  Laterality: N/A;  . Polypectomy     Family History  Problem Relation Age of Onset  . Breast cancer Paternal Aunt   . Ovarian cancer Paternal Grandmother   . Heart disease Mother   . Heart disease Brother   . Stroke Father   . Cancer Brother     lung   Social History  Substance Use Topics  . Smoking status: Former Smoker -- 0.50 packs/day for 40 years    Types: Cigarettes    Quit date: 01/16/2012  . Smokeless tobacco:  Never Used  . Alcohol Use: 6.0 oz/week    10 Glasses of wine per week     Comment: 09/15/2012 "6oz glass of wine qd"    Current Outpatient Prescriptions  Medication Sig Dispense Refill  . aspirin 81 MG tablet Take 81 mg by mouth daily.      . calcium carbonate (OS-CAL) 600 MG TABS Take 600 mg by mouth daily.     . Coenzyme Q10 (COQ-10) 200 MG CAPS Take 1 capsule by mouth daily.    . cyanocobalamin 500 MCG tablet Take 500 mcg by mouth daily.     Marland Kitchen ezetimibe (ZETIA) 10 MG tablet Take 5 mg by mouth 2 (two) times a week.    . irbesartan (AVAPRO) 75 MG tablet Take 75 mg by mouth daily.    . mesalamine (LIALDA) 1.2 G EC tablet Take 2 tablets (2.4 g total) by mouth daily with breakfast. 180 tablet 1  . mometasone (NASONEX) 50 MCG/ACT nasal spray  Place 2 sprays into the nose daily as needed. Allergies.    . Multiple Vitamins-Minerals (PRESERVISION AREDS PO) Take 1 tablet by mouth 2 (two) times daily.    Marland Kitchen omeprazole (PRILOSEC) 20 MG capsule TAKE ONE CAPSULE EACH DAY 30 capsule 6  . Pitavastatin Calcium (LIVALO) 4 MG TABS Take 0.5 tablets by mouth 2 (two) times a week.     . ursodiol (ACTIGALL) 300 MG capsule Take 1 capsule (300 mg total) by mouth 2 (two) times daily. 180 capsule 1   No current facility-administered medications for this visit.   Allergies  Allergen Reactions  . Codeine Nausea And Vomiting  . Penicillins Hives     Review of Systems: All systems reviewed and negative except where noted in HPI.   Labs per HPI above  Physical Exam: BP 152/84 mmHg  Pulse 72  Ht 5' 2"  (1.575 m)  Wt 128 lb 2 oz (58.117 kg)  BMI 23.43 kg/m2 Constitutional: Pleasant,well-developed, female in no acute distress. HEENT: Normocephalic and atraumatic. Conjunctivae are normal. No scleral icterus. Neck supple.  Cardiovascular: Normal rate, regular rhythm.  Pulmonary/chest: Effort normal and breath sounds normal. No wheezing, rales or rhonchi. Abdominal: Soft, nondistended, nontender. Bowel sounds active throughout. There are no masses palpable. No hepatomegaly. Extremities: no edema Lymphadenopathy: No cervical adenopathy noted. Neurological: Alert and oriented to person place and time. Skin: Skin is warm and dry. No rashes noted. Psychiatric: Normal mood and affect. Behavior is normal.   ASSESSMENT AND PLAN: 81 y/o female here for reassessment for PBC, left sided UC, and colon cancer.   PBC - diagnosed via liver biopsy and she reports historically normal alkaline phosphatase and liver enzymes. Recently had a elevated ALT in November with normal AP which was new. Will recheck LFTs at this time to see if they have normalized. If elevation persists would proceed with US of the liver given she has not had hepatic imaging in recent  years. Otherwise TSH, vitamin D are up to date. She will be due for another DEXA next year. Her Ursodiol should be dosed between 13-33m/kg, which puts her dose between 747 and 863mper day, currently at 60043mer day. Recommend she alternate 600m74me day, and 900mg41m other, to average around 750mg 58mday which is more weight dosed for her indication. We will await repeat LFTs and contact her with result and recommendations.   Left sided colitis - stable on Lialda, with last colonoscopy showing remission. Will continue present regimen. I discussed that her risk for  CRC with left sided colitis should not increase until after 15 years of diagnosis, AGA recommends routine surveillance after 15 years of disease, which she has not met yet, although endorses symptoms that she has had longer than that. Would recommend colonoscopy every 1-2 years or so once 15 years out from her diagnosis for surveillance purposes.   H/o Colon cancer - discussed surveillance guidelines. She wants a yearly exam done for surveillance purposes, however she has not had a polyp on her last 2 exams in 2015 and 2016. Recommend don't think she needs one until 2019 based on last exam, although may perform sooner given history of UC as above.   GERD - continue prilosec for her symptoms, recommend lowest dose needed to control symptoms or switch to zantac in light of osteopenia. She wishes to continue PPI for now as it controls her symptoms quite well. Monitor renal function yearly on this regimen.   [place] Cellar, MD [facility] Pager [phone]

## 2015-05-10 ENCOUNTER — Other Ambulatory Visit: Payer: Self-pay | Admitting: *Deleted

## 2015-05-10 DIAGNOSIS — K743 Primary biliary cirrhosis: Secondary | ICD-10-CM

## 2015-05-14 ENCOUNTER — Ambulatory Visit (HOSPITAL_COMMUNITY): Payer: Medicare Other

## 2015-05-16 ENCOUNTER — Ambulatory Visit (HOSPITAL_COMMUNITY)
Admission: RE | Admit: 2015-05-16 | Discharge: 2015-05-16 | Disposition: A | Discharge status: Home / Self Care | Payer: Medicare Other | Source: Ambulatory Visit | Attending: Gastroenterology | Admitting: Gastroenterology

## 2015-05-16 DIAGNOSIS — K743 Primary biliary cirrhosis: Secondary | ICD-10-CM | POA: Insufficient documentation

## 2015-05-17 ENCOUNTER — Telehealth: Payer: Self-pay | Admitting: Gastroenterology

## 2015-05-17 ENCOUNTER — Other Ambulatory Visit: Payer: Self-pay | Admitting: *Deleted

## 2015-05-17 DIAGNOSIS — R935 Abnormal findings on diagnostic imaging of other abdominal regions, including retroperitoneum: Secondary | ICD-10-CM

## 2015-05-28 ENCOUNTER — Other Ambulatory Visit: Payer: Self-pay | Admitting: Gastroenterology

## 2015-05-28 ENCOUNTER — Ambulatory Visit (HOSPITAL_COMMUNITY)
Admission: RE | Admit: 2015-05-28 | Discharge: 2015-05-28 | Disposition: A | Discharge status: Home / Self Care | Payer: Medicare Other | Source: Ambulatory Visit | Attending: Gastroenterology | Admitting: Gastroenterology

## 2015-05-28 DIAGNOSIS — K838 Other specified diseases of biliary tract: Secondary | ICD-10-CM | POA: Diagnosis not present

## 2015-05-28 DIAGNOSIS — R935 Abnormal findings on diagnostic imaging of other abdominal regions, including retroperitoneum: Secondary | ICD-10-CM | POA: Insufficient documentation

## 2015-05-28 DIAGNOSIS — R109 Unspecified abdominal pain: Secondary | ICD-10-CM | POA: Insufficient documentation

## 2015-05-28 DIAGNOSIS — R748 Abnormal levels of other serum enzymes: Secondary | ICD-10-CM | POA: Insufficient documentation

## 2015-05-28 DIAGNOSIS — Z85038 Personal history of other malignant neoplasm of large intestine: Secondary | ICD-10-CM | POA: Diagnosis not present

## 2015-05-28 LAB — POCT I-STAT CREATININE: CREATININE: 0.7 mg/dL (ref 0.44–1.00)

## 2015-05-28 MED ORDER — GADOBENATE DIMEGLUMINE 529 MG/ML IV SOLN
10.0000 mL | Freq: Once | INTRAVENOUS | Status: AC | PRN
  Administered 2015-05-28: 10 mL via INTRAVENOUS

## 2015-05-31 ENCOUNTER — Telehealth: Payer: Self-pay | Admitting: Gastroenterology

## 2015-05-31 NOTE — Telephone Encounter (Signed)
Called pt and informed her that I have not received any paperwork. She states she will her insurance company and have them refax.

## 2015-06-14 ENCOUNTER — Other Ambulatory Visit (INDEPENDENT_AMBULATORY_CARE_PROVIDER_SITE_OTHER): Payer: Medicare Other

## 2015-06-14 DIAGNOSIS — K743 Primary biliary cirrhosis: Secondary | ICD-10-CM

## 2015-06-14 LAB — HEPATIC FUNCTION PANEL
ALBUMIN: 3.9 g/dL (ref 3.5–5.2)
ALK PHOS: 67 U/L (ref 39–117)
ALT: 31 U/L (ref 0–35)
AST: 28 U/L (ref 0–37)
BILIRUBIN TOTAL: 0.7 mg/dL (ref 0.2–1.2)
Bilirubin, Direct: 0.1 mg/dL (ref 0.0–0.3)
Total Protein: 6.8 g/dL (ref 6.0–8.3)

## 2015-06-15 ENCOUNTER — Other Ambulatory Visit: Payer: Self-pay | Admitting: *Deleted

## 2015-06-15 ENCOUNTER — Telehealth: Payer: Self-pay | Admitting: *Deleted

## 2015-06-15 DIAGNOSIS — K743 Primary biliary cirrhosis: Secondary | ICD-10-CM

## 2015-06-15 MED ORDER — URSODIOL 300 MG PO CAPS
ORAL_CAPSULE | ORAL | Status: DC
Start: 2015-06-15 — End: 2015-07-17

## 2015-06-15 NOTE — Telephone Encounter (Signed)
Ursodiol should be dosed between 13-25m/kg, which puts her dose between 747 and 8666mper day, previously she was taking only 60083mer day. Recommend she alternate 600m2me day, and 900mg43m other, to average around 750mg 24mday which is more weight dosed for her indication. Can you put in the script for this? thanks

## 2015-06-15 NOTE — Telephone Encounter (Signed)
Patient states MD changed her prescription for Ursodiol and she will need a new rx. Please advise.

## 2015-06-15 NOTE — Telephone Encounter (Signed)
Patient states she has been trying to get her medications at a cheaper rate and has talked with [facility] about it. She is asking what the progress is.

## 2015-06-15 NOTE — Telephone Encounter (Signed)
Called pt and left vm. Heidi [name] is there a pharmacy in the area that gives pt discounted Lialda?

## 2015-06-15 NOTE — Telephone Encounter (Signed)
Left a message for patient to call back. Need to verify pharmacy for new rx.

## 2015-06-15 NOTE — Telephone Encounter (Signed)
Spoke with patient and she wants her medication sent to [facility]. Rx sent.

## 2015-06-21 DIAGNOSIS — M5416 Radiculopathy, lumbar region: Secondary | ICD-10-CM | POA: Diagnosis not present

## 2015-06-21 DIAGNOSIS — M7122 Synovial cyst of popliteal space [Baker], left knee: Secondary | ICD-10-CM | POA: Diagnosis not present

## 2015-06-21 DIAGNOSIS — Z6823 Body mass index (BMI) 23.0-23.9, adult: Secondary | ICD-10-CM | POA: Diagnosis not present

## 2015-06-21 DIAGNOSIS — M25552 Pain in left hip: Secondary | ICD-10-CM | POA: Diagnosis not present

## 2015-06-25 DIAGNOSIS — M791 Myalgia: Secondary | ICD-10-CM | POA: Diagnosis not present

## 2015-06-25 DIAGNOSIS — M519 Unspecified thoracic, thoracolumbar and lumbosacral intervertebral disc disorder: Secondary | ICD-10-CM | POA: Diagnosis not present

## 2015-06-25 DIAGNOSIS — M25562 Pain in left knee: Secondary | ICD-10-CM | POA: Diagnosis not present

## 2015-06-27 ENCOUNTER — Telehealth: Payer: Self-pay | Admitting: Gastroenterology

## 2015-06-27 NOTE — Telephone Encounter (Signed)
Magda Paganini, do you know anything about this?

## 2015-06-28 ENCOUNTER — Ambulatory Visit
Admission: RE | Admit: 2015-06-28 | Discharge: 2015-06-28 | Disposition: A | Discharge status: Home / Self Care | Payer: Medicare Other | Source: Ambulatory Visit | Attending: Internal Medicine | Admitting: Internal Medicine

## 2015-06-28 ENCOUNTER — Other Ambulatory Visit: Payer: Self-pay | Admitting: Internal Medicine

## 2015-06-28 DIAGNOSIS — M5489 Other dorsalgia: Secondary | ICD-10-CM

## 2015-06-28 DIAGNOSIS — M47816 Spondylosis without myelopathy or radiculopathy, lumbar region: Secondary | ICD-10-CM | POA: Diagnosis not present

## 2015-06-28 NOTE — Telephone Encounter (Signed)
I don't recognize it - she did give another one.  I'll look into it and try to work on it

## 2015-07-05 NOTE — Telephone Encounter (Signed)
Spoke with patient and told her I would call her insurance company to initiate a prior authorization.  Patient agreed.

## 2015-07-12 DIAGNOSIS — M1712 Unilateral primary osteoarthritis, left knee: Secondary | ICD-10-CM | POA: Diagnosis not present

## 2015-07-12 DIAGNOSIS — M7122 Synovial cyst of popliteal space [Baker], left knee: Secondary | ICD-10-CM | POA: Diagnosis not present

## 2015-07-12 DIAGNOSIS — M25562 Pain in left knee: Secondary | ICD-10-CM | POA: Diagnosis not present

## 2015-07-17 ENCOUNTER — Other Ambulatory Visit: Payer: Self-pay

## 2015-07-17 MED ORDER — URSODIOL 300 MG PO CAPS: ORAL_CAPSULE | ORAL | Status: DC

## 2015-07-20 ENCOUNTER — Telehealth: Payer: Self-pay | Admitting: Gastroenterology

## 2015-07-20 MED ORDER — MESALAMINE 1.2 G PO TBEC: 2400.0000 mg | DELAYED_RELEASE_TABLET | Freq: Every day | ORAL | Status: DC

## 2015-07-20 NOTE — Telephone Encounter (Signed)
Patient had checked for her Ursodiol at the wrong pharmacy.  I told her the refill was at [facility] and would be $6.  I sent a Lialda refill there as well so it will prompt documentation for a prior authorization if that is needed.  Patient acknowledged and understood

## 2015-07-21 DIAGNOSIS — M7122 Synovial cyst of popliteal space [Baker], left knee: Secondary | ICD-10-CM | POA: Diagnosis not present

## 2015-07-31 DIAGNOSIS — S83242D Other tear of medial meniscus, current injury, left knee, subsequent encounter: Secondary | ICD-10-CM | POA: Diagnosis not present

## 2015-07-31 DIAGNOSIS — M1712 Unilateral primary osteoarthritis, left knee: Secondary | ICD-10-CM | POA: Diagnosis not present

## 2015-07-31 DIAGNOSIS — S83282D Other tear of lateral meniscus, current injury, left knee, subsequent encounter: Secondary | ICD-10-CM | POA: Diagnosis not present

## 2015-08-07 ENCOUNTER — Telehealth: Payer: Self-pay | Admitting: *Deleted

## 2015-08-07 NOTE — Telephone Encounter (Signed)
Message from pt reporting she is having arthroscopic surgery with Dr. Tonita Cong. Requesting clearance letter to be completed, they also sent one to PCP. Returned call, informed her letter was faxed on 08/06/15 "cleared from an oncology standpoint, Hx of colon cancer. Not on treatment." She voiced appreciation for call.

## 2015-08-09 ENCOUNTER — Ambulatory Visit: Payer: Self-pay | Admitting: Orthopedic Surgery

## 2015-08-10 ENCOUNTER — Encounter (HOSPITAL_COMMUNITY): Payer: Self-pay

## 2015-08-10 ENCOUNTER — Encounter (HOSPITAL_COMMUNITY)
Admission: RE | Admit: 2015-08-10 | Discharge: 2015-08-10 | Disposition: A | Discharge status: Home / Self Care | Payer: Medicare Other | Source: Ambulatory Visit | Attending: Specialist | Admitting: Specialist

## 2015-08-10 ENCOUNTER — Ambulatory Visit: Payer: Self-pay | Admitting: Orthopedic Surgery

## 2015-08-10 DIAGNOSIS — M7122 Synovial cyst of popliteal space [Baker], left knee: Secondary | ICD-10-CM | POA: Diagnosis not present

## 2015-08-10 DIAGNOSIS — Z01818 Encounter for other preprocedural examination: Secondary | ICD-10-CM | POA: Insufficient documentation

## 2015-08-10 DIAGNOSIS — S83282A Other tear of lateral meniscus, current injury, left knee, initial encounter: Secondary | ICD-10-CM | POA: Diagnosis not present

## 2015-08-10 DIAGNOSIS — K219 Gastro-esophageal reflux disease without esophagitis: Secondary | ICD-10-CM | POA: Diagnosis not present

## 2015-08-10 DIAGNOSIS — Z79899 Other long term (current) drug therapy: Secondary | ICD-10-CM | POA: Diagnosis not present

## 2015-08-10 DIAGNOSIS — M1712 Unilateral primary osteoarthritis, left knee: Secondary | ICD-10-CM | POA: Diagnosis not present

## 2015-08-10 DIAGNOSIS — Z885 Allergy status to narcotic agent status: Secondary | ICD-10-CM | POA: Insufficient documentation

## 2015-08-10 DIAGNOSIS — X58XXXA Exposure to other specified factors, initial encounter: Secondary | ICD-10-CM | POA: Diagnosis not present

## 2015-08-10 DIAGNOSIS — Z01812 Encounter for preprocedural laboratory examination: Secondary | ICD-10-CM | POA: Insufficient documentation

## 2015-08-10 DIAGNOSIS — R9431 Abnormal electrocardiogram [ECG] [EKG]: Secondary | ICD-10-CM | POA: Diagnosis not present

## 2015-08-10 DIAGNOSIS — S83242A Other tear of medial meniscus, current injury, left knee, initial encounter: Secondary | ICD-10-CM | POA: Insufficient documentation

## 2015-08-10 DIAGNOSIS — K5190 Ulcerative colitis, unspecified, without complications: Secondary | ICD-10-CM | POA: Diagnosis not present

## 2015-08-10 DIAGNOSIS — Z87891 Personal history of nicotine dependence: Secondary | ICD-10-CM | POA: Insufficient documentation

## 2015-08-10 DIAGNOSIS — Z880 Allergy status to penicillin: Secondary | ICD-10-CM | POA: Diagnosis not present

## 2015-08-10 DIAGNOSIS — I10 Essential (primary) hypertension: Secondary | ICD-10-CM | POA: Diagnosis not present

## 2015-08-10 HISTORY — DX: Venous insufficiency (chronic) (peripheral): I87.2

## 2015-08-10 HISTORY — DX: Unspecified hearing loss, unspecified ear: H91.90

## 2015-08-10 HISTORY — DX: Raynaud's syndrome without gangrene: I73.00

## 2015-08-10 HISTORY — DX: Personal history of other diseases of the digestive system: Z87.19

## 2015-08-10 HISTORY — DX: Repeated falls: R29.6

## 2015-08-10 LAB — CBC
HCT: 45.1 % (ref 36.0–46.0)
Hemoglobin: 14.9 g/dL (ref 12.0–15.0)
MCH: 31.8 pg (ref 26.0–34.0)
MCHC: 33 g/dL (ref 30.0–36.0)
MCV: 96.4 fL (ref 78.0–100.0)
PLATELETS: 252 10*3/uL (ref 150–400)
RBC: 4.68 MIL/uL (ref 3.87–5.11)
RDW: 14 % (ref 11.5–15.5)
WBC: 4.9 10*3/uL (ref 4.0–10.5)

## 2015-08-10 LAB — COMPREHENSIVE METABOLIC PANEL
ALBUMIN: 3.6 g/dL (ref 3.5–5.0)
ALK PHOS: 63 U/L (ref 38–126)
ALT: 21 U/L (ref 14–54)
AST: 22 U/L (ref 15–41)
Anion gap: 7 (ref 5–15)
BUN: 17 mg/dL (ref 6–20)
CALCIUM: 8.9 mg/dL (ref 8.9–10.3)
CO2: 28 mmol/L (ref 22–32)
CREATININE: 0.81 mg/dL (ref 0.44–1.00)
Chloride: 105 mmol/L (ref 101–111)
Glucose, Bld: 85 mg/dL (ref 65–99)
Potassium: 4.5 mmol/L (ref 3.5–5.1)
SODIUM: 140 mmol/L (ref 135–145)
Total Bilirubin: 0.7 mg/dL (ref 0.3–1.2)
Total Protein: 6.4 g/dL — ABNORMAL LOW (ref 6.5–8.1)

## 2015-08-10 LAB — PROTIME-INR
INR: 1.01 (ref 0.00–1.49)
PROTHROMBIN TIME: 13.1 s (ref 11.6–15.2)

## 2015-08-10 NOTE — Patient Instructions (Signed)
Heidi [name]  08/10/2015   Your procedure is scheduled on: Thursday August 23, 2015  Report to [facility]  Entrance take [place]  elevators to 3rd floor to  [place] at 9:00 AM.  Call this number if you have problems the morning of surgery [phone]   Remember: ONLY 1 PERSON MAY GO WITH YOU TO SHORT STAY TO GET  READY MORNING OF [place].  Do not eat food or drink liquids :After Midnight.     Take these medicines the morning of surgery with A SIP OF WATER: Omeprazole; Fexofenadine (Allegra) if needed                                You may not have any metal on your body including hair pins and              piercings  Do not wear jewelry, make-up, lotions, powders or perfumes, deodorant             Do not wear nail polish.  Do not shave  48 hours prior to surgery.              Do not bring valuables to the hospital. [employer].  Contacts, dentures or bridgework may not be worn into surgery.      Patients discharged the day of surgery will not be allowed to drive home.  Name and phone number of your driver:Robin Merlene Pulling (daughter)               Please read over the following fact sheets you were given:[facility] [employer]  _____________________________________________________________________             [facility] - Preparing for Surgery Before surgery, you can play an important role.  Because skin is not sterile, your skin needs to be as free of germs as possible.  You can reduce the number of germs on your skin by washing with CHG (chlorahexidine gluconate) soap before surgery.  CHG is an antiseptic cleaner which kills germs and bonds with the skin to continue killing germs even after washing. Please DO NOT use if you have an allergy to CHG or antibacterial soaps.  If your skin becomes reddened/irritated stop using the CHG and inform your nurse when you arrive at Short Stay. Do not shave  (including legs and underarms) for at least 48 hours prior to the first CHG shower.  You may shave your face/neck. Please follow these instructions carefully:  1.  Shower with CHG Soap the night before surgery and the  morning of Surgery.  2.  If you choose to wash your hair, wash your hair first as usual with your  normal  shampoo.  3.  After you shampoo, rinse your hair and body thoroughly to remove the  shampoo.                           4.  Use CHG as you would any other liquid soap.  You can apply chg directly  to the skin and wash                       Gently with a  scrungie or clean washcloth.  5.  Apply the CHG Soap to your body ONLY FROM THE NECK DOWN.   Do not use on face/ open                           Wound or open sores. Avoid contact with eyes, ears mouth and genitals (private parts).                       Wash face,  Genitals (private parts) with your normal soap.             6.  Wash thoroughly, paying special attention to the area where your surgery  will be performed.  7.  Thoroughly rinse your body with warm water from the neck down.  8.  DO NOT shower/wash with your normal soap after using and rinsing off  the CHG Soap.                9.  Pat yourself dry with a clean towel.            10.  Wear clean pajamas.            11.  Place clean sheets on your bed the night of your first shower and do not  sleep with pets. Day of Surgery : Do not apply any lotions/deodorants the morning of surgery.  Please wear clean clothes to the hospital/surgery center.  FAILURE TO FOLLOW THESE INSTRUCTIONS MAY RESULT IN THE CANCELLATION OF YOUR SURGERY PATIENT SIGNATURE_________________________________  NURSE SIGNATURE__________________________________  ________________________________________________________________________   Adam Phenix  An incentive spirometer is a tool that can help keep your lungs clear and active. This tool measures how well you are filling your lungs with  each breath. Taking long deep breaths may help reverse or decrease the chance of developing breathing (pulmonary) problems (especially infection) following:  A long period of time when you are unable to move or be active. BEFORE THE PROCEDURE   If the spirometer includes an indicator to show your best effort, your nurse or respiratory therapist will set it to a desired goal.  If possible, sit up straight or lean slightly forward. Try not to slouch.  Hold the incentive spirometer in an upright position. INSTRUCTIONS FOR USE  1. Sit on the edge of your bed if possible, or sit up as far as you can in bed or on a chair. 2. Hold the incentive spirometer in an upright position. 3. Breathe out normally. 4. Place the mouthpiece in your mouth and seal your lips tightly around it. 5. Breathe in slowly and as deeply as possible, raising the piston or the ball toward the top of the column. 6. Hold your breath for 3-5 seconds or for as long as possible. Allow the piston or ball to fall to the bottom of the column. 7. Remove the mouthpiece from your mouth and breathe out normally. 8. Rest for a few seconds and repeat Steps 1 through 7 at least 10 times every 1-2 hours when you are awake. Take your time and take a few normal breaths between deep breaths. 9. The spirometer may include an indicator to show your best effort. Use the indicator as a goal to work toward during each repetition. 10. After each set of 10 deep breaths, practice coughing to be sure your lungs are clear. If you have an incision (the cut made at the time of surgery), support your incision  when coughing by placing a pillow or rolled up towels firmly against it. Once you are able to get out of bed, walk around indoors and cough well. You may stop using the incentive spirometer when instructed by your caregiver.  RISKS AND COMPLICATIONS  Take your time so you do not get dizzy or light-headed.  If you are in pain, you may need to take or  ask for pain medication before doing incentive spirometry. It is harder to take a deep breath if you are having pain. AFTER USE  Rest and breathe slowly and easily.  It can be helpful to keep track of a log of your progress. Your caregiver can provide you with a simple table to help with this. If you are using the spirometer at home, follow these instructions: [place] IF:   You are having difficultly using the spirometer.  You have trouble using the spirometer as often as instructed.  Your pain medication is not giving enough relief while using the spirometer.  You develop fever of 100.5 F (38.1 C) or higher. SEEK IMMEDIATE MEDICAL CARE IF:   You cough up bloody sputum that had not been present before.  You develop fever of 102 F (38.9 C) or greater.  You develop worsening pain at or near the incision site. MAKE SURE YOU:   Understand these instructions.  Will watch your condition.  Will get help right away if you are not doing well or get worse. Document Released: 09/01/2006 Document Revised: 07/14/2011 Document Reviewed: 11/02/2006 [facility] Patient Information 2014 [place], [place].   ________________________________________________________________________

## 2015-08-10 NOTE — H&P (Signed)
Heidi [name] DOB: [birth year] Widowed / Language: Heidi [name] / Race: White Female  Chief Complaint: L knee pain  History of Present Illness The patient is a 81 year old female who presents with knee complaints. The patient is seen today in referral from Dr. Rolena [name]. The patient reports left knee symptoms including: pain (pain when coming down stairs) and giving way which began 2 month(s) ago without any known injury (but did have a fall in September). The patient describes the severity of the symptoms as mild. The patient describes their pain as aching. Prior to being seen today the patient was previously evaluated by a primary care provider (and followed up with Heidi Deter, PA-C with Dr. Rolena [name]). Previous work-up for this problem has included knee x-rays and knee MRI. Past treatment for this problem has included application of heat, oral corticosteroids, intra-articular injection of corticosteroids, nonsteroidal anti-inflammatory drugs (Heidi [name]) and non-opioid analgesics (Heidi [name]). Symptoms are reported to be located in the left lateral knee, left medial knee and left posterior knee. Onset of symptoms was with symptoms now occurring intermittently. The patient is not currently being treated for this problem.  Heidi [name] presents today, referred by Dr. Rolena [name] for examination of her left knee. She is here with her daughter, Heidi [name]. She is now approximately two months out from onset. She saw Heidi [name] earlier this month, had a cortisone injection in the left knee. Reports that, that did seem to help some with pain and swelling. She is still having some instability in the knee, significant trouble going down the stairs due to pain, occasional sharp shooting pains. Her swelling is improved since the injection. She is still noticing some tightness in the posterior aspect of the knee. She has tried Heidi [name] and Heidi [name] with no relief. Her family doctor gave her tramadol, but it makes her sleepy. She still  works during the day. Therefore, she is not really taking the tramadol because of how tired it made her. She is having trouble sleeping at night due to pain. She did note that after the injection, her pain was so much better. She stretched out the leg, there was lot of popping and creaking and she is not sure what that was, but it did seem to worsen her pain temporarily. She had x-rays initially ordered by her PCP. Denies any prior issues with this knee, has had an arthroscopy on the other knee.  Problem List/Past Medical Hx Gastroesophageal Reflux Disease  High blood pressure  Ulcerative Colitis   Allergies Heidi [name] 04/25/1996 Heidi [name]  Rash.  Family History Bleeding disorder  grandmother fathers side Cancer  child Cerebrovascular Accident  father Drug / Alcohol Addiction  brother and grandmother fathers side Heart Disease  mother and brother Heart disease in female family member before age 74  Hypertension  mother and brother Osteoarthritis  sister First Degree Relatives   Social History  Tobacco use  former smoker Alcohol use  current drinker; 5-7 per week Children  3 Drug/Alcohol Rehab (Currently)  no Drug/Alcohol Rehab (Previously)  no Exercise  Exercises rarely; does running / walking Illicit drug use  no Living situation  live alone Marital status  widowed Number of flights of stairs before winded  greater than 5 Pain Contract  no Tobacco / smoke exposure  no  Medication History Aspirin (81MG Tablet, Oral) Active. [name] Carbonate (Oral) Specific strength unknown - Active. Co Q 10 (Oral) Specific strength unknown - Active. Cyanocobalamin (500MCG Tablet, Oral) Active. Heidi [name] (75MG Tablet, Oral) Active. Heidi [name] (1.2GM Tablet  DR, Oral) Active. Heidi [name] (50MCG/ACT Suspension, Nasal) Active. Heidi [name] (20MG Capsule DR, Oral) Active. Heidi [name] (4MG Tablet, Oral) Active. Heidi [name] (300MG Capsule, Oral)  Active. Heidi (1% [name], Transdermal) Active. Medications Reconciled  Pregnancy / Birth History Pregnant  no  Past Surgical History  Arthroscopy of Knee  right Cataract Surgery  bilateral Colectomy  partial Colon Polyp Removal - Open   Physical Exam On exam, she is well nourished, well developed, awake, alert, and oriented x3, in no acute distress. On examination of the left knee, trace effusion. Tender to palpation through the mid medial joint space, midlateral joint space, nontender patella, patellar tendon, quad tendon, fibular head, peroneal nerve. She is mildly tender through the popliteal space. There is a palpable Baker cyst. This does feel as though it is smaller in size than what is apparent on the MRI from a week and a half ago. Range of motion approximately 0 to 110 degrees. No pain or laxity with varus or valgus stress. No calf pain or sign of DVT. No instability. Sensation intact.  RADIOGRAPHS Prior x-rays reviewed, nonweightbearing, with some moderate medial joint space narrowing. Updated x-rays ordered, obtained, reviewed today, weightbearing AP, lateral, and merchant view. No fracture, subluxation, dislocation, lytic or blastic lesions. Moderate medial joint space narrowing. She does have a slight lateral patellar tilt bilaterally. Joint space is maintained. There is no collapse.  MRI images and report reviewed today by Heidi [name]. There is a complex lateral meniscus tear with a displaced fragment. A small free edge tibial surface medial meniscus tear, some diffuse chondral thinning laterally as well as a small area that does appear to be potentially a full thickness chondral loss, mid to posterior lateral tibial plateau. Moderate-to-large effusion, leaking Baker cyst, larger in size.     Assessment & Plan Primary osteoarthritis of one knee, left (M17.12) Acute medial meniscus tear of left knee, subsequent encounter (T61.443X) Acute lateral meniscus tear of left  knee, subsequent encounter (V40.086P)  1. Ongoing left knee pain, mechanical in nature, with instability symptoms due to medial and lateral meniscus tears. Two months out from onset, had a prior history of a fall 6 months ago. 2. Underlying left knee osteoarthritis. 3. Baker cyst, left knee, likely secondary to her meniscal tears.  We discussed relevant anatomy, etiology of her pain in detail, discussed activity modifications to what exacerbation and discussed treatment options. She is somewhat better following the cortisone injection, however, still has pain and instability as well as some posterior swelling in her Baker cyst. Given her MRI findings, duration of her symptoms, and ongoing pain and swelling and instability, it would be reasonable to proceed with a left knee arthroscopy, partial medial and lateral menisectomies and debridement. We discussed the procedure itself as well as the risks, complications, and alternatives including, but not limited to DVT, PE, failure of procedure, need for secondary procedure anesthesia risk or even death, discussed postop protocols, crutches for 1 or 2 days. Home exercise program, ice, elevation, pain medication, she typically does not tolerate Heidi [name] well, it makes her sick. She has a rash with penicillin, but no swelling that she recalls. No MRSA, no DVT. We will obtain clearance from her PCP, Dr. Joylene Draft as well as Dr. Benay Spice who followed her for her colon cancer and has not technically released her yet. We discussed the possibility of ongoing swelling and fluid in the Baker cyst postoperatively, which could require aspiration and injection. Also discussed with her underlying osteoarthritis, possible need for future injections in  the knee due to arthritic symptoms. At this point, given the fact that her joint space is well maintained, we would not recommend discussing any type of joint replacement. Discussed the very rare possibility that we could have to  surgically remove the Baker cyst. We will obtain clearance and then proceed accordingly. She will follow up 10 to 14 days postop for suture removal. Call if any questions or concerns in the interim. The patient was seen in conjunction with Heidi [name] today.  I had a long discussion with the patient concerning the risks and benefits of knee arthroscopy including help from the arthroscopic procedure as well as no help from the arthroscopic procedure or worsening of symptoms. Also discussed infection, DVT, PE, anesthetic complications, etc. Also discussed the possibility of repeat arthroscopic surgery required in the future or total knee replacement. I provided the patient with an illustrated handout and discussed it in detail as well as discussed the postoperative and perioperative courses and return to functional activities including work. Need for postoperative DVT prophylaxis was discussed as well.  Plan left knee arthroscopy, partial medial and lateral meniscectomies, debridement   Signed electronically by Lacie Draft, PA-C for Heidi [name]

## 2015-08-13 NOTE — Progress Notes (Signed)
EKG/epic per PAT visit 08/10/2015 reviewed per anesthesia/Dr Marcell Barlow. No orders given. Anesthesia to see pt day of surgery.

## 2015-08-22 NOTE — Progress Notes (Signed)
Pt aware to arrive at [facility] at 8:45 am 08/23/2015. No food or drink after  Midnight.

## 2015-08-23 ENCOUNTER — Encounter (HOSPITAL_COMMUNITY): Payer: Self-pay | Admitting: Anesthesiology

## 2015-08-23 ENCOUNTER — Ambulatory Visit (HOSPITAL_COMMUNITY)
Admission: RE | Admit: 2015-08-23 | Discharge: 2015-08-23 | Disposition: A | Discharge status: Home / Self Care | Payer: Medicare Other | Source: Ambulatory Visit | Attending: Specialist | Admitting: Specialist

## 2015-08-23 ENCOUNTER — Ambulatory Visit (HOSPITAL_COMMUNITY): Payer: Medicare Other | Admitting: Anesthesiology

## 2015-08-23 ENCOUNTER — Encounter (HOSPITAL_COMMUNITY)
Admission: RE | Disposition: A | Discharge status: Home / Self Care | Payer: Self-pay | Source: Ambulatory Visit | Attending: Specialist

## 2015-08-23 DIAGNOSIS — S83242A Other tear of medial meniscus, current injury, left knee, initial encounter: Secondary | ICD-10-CM | POA: Diagnosis not present

## 2015-08-23 DIAGNOSIS — X58XXXA Exposure to other specified factors, initial encounter: Secondary | ICD-10-CM | POA: Diagnosis not present

## 2015-08-23 DIAGNOSIS — M94262 Chondromalacia, left knee: Secondary | ICD-10-CM | POA: Insufficient documentation

## 2015-08-23 DIAGNOSIS — Z7982 Long term (current) use of aspirin: Secondary | ICD-10-CM | POA: Insufficient documentation

## 2015-08-23 DIAGNOSIS — Z79899 Other long term (current) drug therapy: Secondary | ICD-10-CM | POA: Diagnosis not present

## 2015-08-23 DIAGNOSIS — Z87891 Personal history of nicotine dependence: Secondary | ICD-10-CM | POA: Diagnosis not present

## 2015-08-23 DIAGNOSIS — M7122 Synovial cyst of popliteal space [Baker], left knee: Secondary | ICD-10-CM | POA: Diagnosis not present

## 2015-08-23 DIAGNOSIS — I10 Essential (primary) hypertension: Secondary | ICD-10-CM | POA: Insufficient documentation

## 2015-08-23 DIAGNOSIS — I739 Peripheral vascular disease, unspecified: Secondary | ICD-10-CM | POA: Insufficient documentation

## 2015-08-23 DIAGNOSIS — S83282A Other tear of lateral meniscus, current injury, left knee, initial encounter: Secondary | ICD-10-CM | POA: Insufficient documentation

## 2015-08-23 DIAGNOSIS — K219 Gastro-esophageal reflux disease without esophagitis: Secondary | ICD-10-CM | POA: Diagnosis not present

## 2015-08-23 DIAGNOSIS — M1732 Unilateral post-traumatic osteoarthritis, left knee: Secondary | ICD-10-CM

## 2015-08-23 HISTORY — PX: KNEE ARTHROSCOPY: SHX127

## 2015-08-23 SURGERY — ARTHROSCOPY, KNEE
Anesthesia: General | Site: Knee | Laterality: Left

## 2015-08-23 MED ORDER — SODIUM CHLORIDE 0.9 % IJ SOLN
INTRAMUSCULAR | Status: AC
  Filled 2015-08-23: qty 10

## 2015-08-23 MED ORDER — CLINDAMYCIN PHOSPHATE 900 MG/50ML IV SOLN
INTRAVENOUS | Status: AC
  Filled 2015-08-23: qty 50

## 2015-08-23 MED ORDER — BUPIVACAINE-EPINEPHRINE (PF) 0.5% -1:200000 IJ SOLN
INTRAMUSCULAR | Status: AC
  Filled 2015-08-23: qty 30

## 2015-08-23 MED ORDER — PROPOFOL 10 MG/ML IV BOLUS
INTRAVENOUS | Status: DC | PRN
  Administered 2015-08-23: 80 mg via INTRAVENOUS

## 2015-08-23 MED ORDER — PROPOFOL 10 MG/ML IV BOLUS
INTRAVENOUS | Status: AC
  Filled 2015-08-23: qty 20

## 2015-08-23 MED ORDER — FENTANYL CITRATE (PF) 100 MCG/2ML IJ SOLN
INTRAMUSCULAR | Status: DC | PRN
  Administered 2015-08-23: 25 ug via INTRAVENOUS

## 2015-08-23 MED ORDER — EPHEDRINE SULFATE 50 MG/ML IJ SOLN
INTRAMUSCULAR | Status: DC | PRN
  Administered 2015-08-23: 5 mg via INTRAVENOUS
  Administered 2015-08-23: 10 mg via INTRAVENOUS

## 2015-08-23 MED ORDER — CLINDAMYCIN PHOSPHATE 900 MG/50ML IV SOLN
900.0000 mg | INTRAVENOUS | Status: AC
  Administered 2015-08-23: 900 mg via INTRAVENOUS

## 2015-08-23 MED ORDER — FENTANYL CITRATE (PF) 100 MCG/2ML IJ SOLN: 25.0000 ug | INTRAMUSCULAR | Status: DC | PRN

## 2015-08-23 MED ORDER — LACTATED RINGERS IV SOLN
INTRAVENOUS | Status: DC
  Administered 2015-08-23: 10:00:00 via INTRAVENOUS

## 2015-08-23 MED ORDER — FENTANYL CITRATE (PF) 100 MCG/2ML IJ SOLN
INTRAMUSCULAR | Status: AC
  Filled 2015-08-23: qty 2

## 2015-08-23 MED ORDER — TRAMADOL HCL 50 MG PO TABS: 50.0000 mg | ORAL_TABLET | Freq: Four times a day (QID) | ORAL | Status: DC | PRN

## 2015-08-23 MED ORDER — PROCHLORPERAZINE EDISYLATE 5 MG/ML IJ SOLN
10.0000 mg | Freq: Once | INTRAMUSCULAR | Status: DC | PRN
  Filled 2015-08-23: qty 2

## 2015-08-23 MED ORDER — TRAMADOL HCL 50 MG PO TABS
50.0000 mg | ORAL_TABLET | Freq: Four times a day (QID) | ORAL | Status: DC | PRN
  Administered 2015-08-23: 50 mg via ORAL
  Filled 2015-08-23: qty 1

## 2015-08-23 MED ORDER — EPHEDRINE SULFATE 50 MG/ML IJ SOLN
INTRAMUSCULAR | Status: AC
  Filled 2015-08-23: qty 1

## 2015-08-23 MED ORDER — ONDANSETRON HCL 4 MG/2ML IJ SOLN
INTRAMUSCULAR | Status: AC
  Filled 2015-08-23: qty 2

## 2015-08-23 MED ORDER — DOCUSATE SODIUM 100 MG PO CAPS: 100.0000 mg | ORAL_CAPSULE | Freq: Two times a day (BID) | ORAL | Status: DC | PRN

## 2015-08-23 MED ORDER — ONDANSETRON HCL 4 MG/2ML IJ SOLN
INTRAMUSCULAR | Status: DC | PRN
  Administered 2015-08-23: 4 mg via INTRAVENOUS

## 2015-08-23 MED ORDER — BUPIVACAINE-EPINEPHRINE 0.5% -1:200000 IJ SOLN
INTRAMUSCULAR | Status: DC | PRN
  Administered 2015-08-23: 20 mL

## 2015-08-23 MED ORDER — EPINEPHRINE HCL 1 MG/ML IJ SOLN
INTRAMUSCULAR | Status: AC
  Filled 2015-08-23: qty 2

## 2015-08-23 SURGICAL SUPPLY — 26 items
BANDAGE ACE 6X5 VEL STRL LF (GAUZE/BANDAGES/DRESSINGS) ×3 IMPLANT
BANDAGE ELASTIC 6 VELCRO ST LF (GAUZE/BANDAGES/DRESSINGS) ×3 IMPLANT
BLADE 4.2CUDA (BLADE) IMPLANT
BLADE CUDA SHAVER 3.5 (BLADE) ×3 IMPLANT
BOOTIES KNEE HIGH SLOAN (MISCELLANEOUS) ×3 IMPLANT
CLOTH 2% CHLOROHEXIDINE 3PK (PERSONAL CARE ITEMS) ×3 IMPLANT
DRSG EMULSION OIL 3X3 NADH (GAUZE/BANDAGES/DRESSINGS) ×3 IMPLANT
DRSG PAD ABDOMINAL 8X10 ST (GAUZE/BANDAGES/DRESSINGS) ×3 IMPLANT
DURAPREP 26ML APPLICATOR (WOUND CARE) ×3 IMPLANT
GAUZE SPONGE 4X4 12PLY STRL (GAUZE/BANDAGES/DRESSINGS) ×3 IMPLANT
GLOVE BIOGEL PI IND STRL 7.0 (GLOVE) ×1 IMPLANT
GLOVE BIOGEL PI INDICATOR 7.0 (GLOVE) ×2
GLOVE SURG SS PI 7.0 STRL IVOR (GLOVE) ×3 IMPLANT
GLOVE SURG SS PI 7.5 STRL IVOR (GLOVE) IMPLANT
GLOVE SURG SS PI 8.0 STRL IVOR (GLOVE) ×3 IMPLANT
GOWN STRL REUS W/TWL XL LVL3 (GOWN DISPOSABLE) ×3 IMPLANT
KIT BASIN OR (CUSTOM PROCEDURE TRAY) ×3 IMPLANT
MANIFOLD NEPTUNE II (INSTRUMENTS) ×3 IMPLANT
PACK ARTHROSCOPY WL (CUSTOM PROCEDURE TRAY) ×3 IMPLANT
PADDING CAST COTTON 6X4 STRL (CAST SUPPLIES) ×3 IMPLANT
SUT ETHILON 4 0 PS 2 18 (SUTURE) ×3 IMPLANT
TOWEL OR 17X26 10 PK STRL BLUE (TOWEL DISPOSABLE) ×3 IMPLANT
TUBING ARTHRO INFLOW-ONLY STRL (TUBING) ×3 IMPLANT
WAND HAND CNTRL MULTIVAC 50 (MISCELLANEOUS) ×3 IMPLANT
WAND HAND CNTRL MULTIVAC 90 (MISCELLANEOUS) IMPLANT
WRAP KNEE MAXI GEL POST OP (GAUZE/BANDAGES/DRESSINGS) ×3 IMPLANT

## 2015-08-23 NOTE — Anesthesia Postprocedure Evaluation (Signed)
Anesthesia Post Note  Patient: Heidi [name]  Procedure(s) Performed: Procedure(s) (LRB): ARTHROSCOPY KNEE PARTIAL MEDIAL AND LATERAL MENISCECTOMY AND DEBRIDMENT (Left)  Patient location during evaluation: PACU Anesthesia Type: General Level of consciousness: awake and alert Pain management: pain level controlled Vital Signs Assessment: post-procedure vital signs reviewed and stable Respiratory status: spontaneous breathing, nonlabored ventilation, respiratory function stable and patient connected to nasal cannula oxygen Cardiovascular status: blood pressure returned to baseline and stable Postop Assessment: no signs of nausea or vomiting Anesthetic complications: no    Last Vitals:  Filed Vitals:   08/23/15 1215 08/23/15 1325  BP: 156/75 149/68  Pulse: 69 65  Temp: 36.4 C   Resp: 16 16    Last Pain:  Filed Vitals:   08/23/15 1328  PainSc: 3                  Jacquel Redditt J

## 2015-08-23 NOTE — Transfer of Care (Signed)
Immediate Anesthesia Transfer of Care Note  Patient: Heidi [name]  Procedure(s) Performed: Procedure(s): ARTHROSCOPY KNEE PARTIAL MEDIAL AND LATERAL MENISCECTOMY AND DEBRIDMENT (Left)  Patient Location: PACU  Anesthesia Type:General  Level of Consciousness:  sedated, patient cooperative and responds to stimulation  Airway & Oxygen Therapy:Patient Spontanous Breathing and Patient connected to face mask oxgen  Post-op Assessment:  Report given to PACU RN and Post -op Vital signs reviewed and stable  Post vital signs:  Reviewed and stable  Last Vitals:  Filed Vitals:   08/23/15 0845  BP: 136/83  Pulse: 63  Temp: 36.4 C  Resp: 16    Complications: No apparent anesthesia complications

## 2015-08-23 NOTE — Progress Notes (Signed)
Ambulated w walker to BR. Tolerated well

## 2015-08-23 NOTE — Discharge Instructions (Signed)
ARTHROSCOPIC KNEE SURGERY HOME CARE INSTRUCTIONS   PAIN You will be expected to have a moderate amount of pain in the affected knee for approximately two weeks.  However, the first two to four days will be the most severe in terms of the pain you will experience.  Prescriptions have been provided for you to take as needed for the pain.  The pain can be markedly reduced by using the ice/compressive bandage given.  Exchange the ice packs whenever they thaw.  During the night, keep the bandage on because it will still provide some compression for the swelling.  Also, keep the leg elevated on pillows above your heart, and this will help alleviate the pain and swelling.  MEDICATION Prescriptions have been provided to take as needed for pain. To prevent blood clots, take Aspirin 381m daily with a meal if not on a blood thinner and if no history of stomach ulcers.  ACTIVITY It is preferred that you stay on bedrest for approximately 24 hours.  However, you may go to the bathroom with help.  After this, you can start to be up and about progressively more.  Remember that the swelling may still increase after three to four days if you are up and doing too much.  You may put as much weight on the affected leg as pain will allow.  Use your crutches for comfort and safety.  However, as soon as you are able, you may discard the crutches and go without them.   DRESSING Keep the current dressing as dry as possible.  Two days after your surgery, you may remove the ice/compressive wrap, and surgical dressing.  You may now take a shower, but do not scrub the sounds directly with soap.  Let water rinse over these and gently wipe with your hand.  Reapply band-aids over the puncture wounds and more gauze if needed.  A slight amount of thin drainage can be normal at this time, and do not let it frighten you.  Reapply the ice/compressive wrap.  You may now repeat this every day each time you shower.  SYMPTOMS TO REPORT TO  YOUR DOCTOR  -Extreme pain.  -Extreme swelling.  -Temperature above 101 degrees that does not come down with acetaminophen     (Tylenol).  -Any changes in the feeling, color or movement of your toes.  -Extreme redness, heat, swelling or drainage at your incision  EXERCISE It is preferred that you begin to exercise on the day of your surgery.  Straight leg raises and short arc quads should be begun the afternoon or evening of surgery and continued until you come back for your follow-up appointment.   Attached is an instruction sheet on how to perform these two simple exercises.  Do these at least three times per day if not more.  You may bend your knee as much as is comfortable.  The puncture wounds may occasionally be slightly uncomfortable with bending of the knee.  Do not let this frighten you.  It is important to keep your knee motion, but do not overdo it.  If you have significant pain, simply do not bend the knee as far.   You will be given more exercises to perform at your first return visit.    RETURN APPOINTMENT Please make an appointment to be seen by your doctor in 14 days from your surgery.  Patient Signature:  ________________________________________________________  Nurse's Signature:  ________________________________________________________   General Anesthesia, Adult, Care After Refer to this sheet in  the next few weeks. These instructions provide you with information on caring for yourself after your procedure. Your health care provider may also give you more specific instructions. Your treatment has been planned according to current medical practices, but problems sometimes occur. Call your health care provider if you have any problems or questions after your procedure. WHAT TO EXPECT AFTER THE PROCEDURE After the procedure, it is typical to experience:  Sleepiness.  Nausea and vomiting. HOME CARE INSTRUCTIONS  For the first 24 hours after general anesthesia:  Have a  responsible person with you.  Do not drive a car. If you are alone, do not take public transportation.  Do not drink alcohol.  Do not take medicine that has not been prescribed by your health care provider.  Do not sign important papers or make important decisions.  You may resume a normal diet and activities as directed by your health care provider.  Change bandages (dressings) as directed.  If you have questions or problems that seem related to general anesthesia, call the hospital and ask for the anesthetist or anesthesiologist on call. SEEK MEDICAL CARE IF:  You have nausea and vomiting that continue the day after anesthesia.  You develop a rash. SEEK IMMEDIATE MEDICAL CARE IF:   You have difficulty breathing.  You have chest pain.  You have any allergic problems.   This information is not intended to replace advice given to you by your health care provider. Make sure you discuss any questions you have with your health care provider.   Document Released: 07/28/2000 Document Revised: 05/12/2014 Document Reviewed: 08/20/2011 Elsevier Interactive Patient Education [employer].

## 2015-08-23 NOTE — Anesthesia Procedure Notes (Signed)
Procedure Name: LMA Insertion Date/Time: 08/23/2015 10:32 AM Performed by: Anne Fu Pre-anesthesia Checklist: Patient identified, Emergency Drugs available, Suction available, Patient being monitored and Timeout performed Patient Re-evaluated:Patient Re-evaluated prior to inductionOxygen Delivery Method: Circle system utilized Preoxygenation: Pre-oxygenation with 100% oxygen Intubation Type: IV induction Ventilation: Mask ventilation without difficulty LMA: LMA inserted LMA Size: 3.0 Number of attempts: 1 Placement Confirmation: positive ETCO2 and breath sounds checked- equal and bilateral Tube secured with: Tape

## 2015-08-23 NOTE — Interval H&P Note (Signed)
History and Physical Interval Note:  08/23/2015 7:05 AM  Heidi [name]  has presented today for surgery, with the diagnosis of LEFT KNEE MEDIAL AND LATERAL MENISCUS TEAR,DJD AND BAKERS CYST  The various methods of treatment have been discussed with the patient and family. After consideration of risks, benefits and other options for treatment, the patient has consented to  Procedure(s): ARTHROSCOPY KNEE PARTIAL MEDIAL AND LATERAL MENISCECTOMY AND DEBRIDMENT (Left) as a surgical intervention .  The patient's history has been reviewed, patient examined, no change in status, stable for surgery.  I have reviewed the patient's chart and labs.  Questions were answered to the patient's satisfaction.     Shamera Yarberry C

## 2015-08-23 NOTE — Anesthesia Preprocedure Evaluation (Addendum)
Anesthesia Evaluation  Patient identified by MRN, date of birth, ID band Patient awake    Reviewed: Allergy & Precautions, NPO status , Patient's Chart, lab work & pertinent test results  Airway Mallampati: II  TM Distance: >3 FB Neck ROM: Full    Dental no notable dental hx.    Pulmonary former smoker,    Pulmonary exam normal breath sounds clear to auscultation       Cardiovascular hypertension, Pt. on medications + Peripheral Vascular Disease  Normal cardiovascular exam+ dysrhythmias + Valvular Problems/Murmurs  Rhythm:Regular Rate:Normal     Neuro/Psych negative neurological ROS  negative psych ROS   GI/Hepatic hiatal hernia, PUD, GERD  ,(+) Cirrhosis       ,   Endo/Other  negative endocrine ROS  Renal/GU negative Renal ROS  negative genitourinary   Musculoskeletal  (+) Arthritis ,   Abdominal   Peds negative pediatric ROS (+)  Hematology negative hematology ROS (+)   Anesthesia Other Findings   Reproductive/Obstetrics negative OB ROS                             Anesthesia Physical Anesthesia Plan  ASA: III  Anesthesia Plan: General   Post-op Pain Management:    Induction: Intravenous  Airway Management Planned: LMA  Additional Equipment:   Intra-op Plan:   Post-operative Plan: Extubation in OR  Informed Consent: I have reviewed the patients History and Physical, chart, labs and discussed the procedure including the risks, benefits and alternatives for the proposed anesthesia with the patient or authorized representative who has indicated his/her understanding and acceptance.   Dental advisory given  Plan Discussed with: CRNA  Anesthesia Plan Comments:         Anesthesia Quick Evaluation

## 2015-08-23 NOTE — H&P (View-Only) (Signed)
Heidi [name] DOB: 09-Oct-1934 Widowed / Language: Cleophus Molt / Race: White Female  Chief Complaint: L knee pain  History of Present Illness The patient is a 81 year old female who presents with knee complaints. The patient is seen today in referral from Dr. Rolena Infante. The patient reports left knee symptoms including: pain (pain when coming down stairs) and giving way which began 2 month(s) ago without any known injury (but did have a fall in September). The patient describes the severity of the symptoms as mild. The patient describes their pain as aching. Prior to being seen today the patient was previously evaluated by a primary care provider (and followed up with Ronette Deter, PA-C with Dr. Rolena Infante). Previous work-up for this problem has included knee x-rays and knee MRI. Past treatment for this problem has included application of heat, oral corticosteroids, intra-articular injection of corticosteroids, nonsteroidal anti-inflammatory drugs (Voltaren Gel) and non-opioid analgesics (Tylenol). Symptoms are reported to be located in the left lateral knee, left medial knee and left posterior knee. Onset of symptoms was with symptoms now occurring intermittently. The patient is not currently being treated for this problem.  Heidi [name] presents today, referred by Dr. Rolena Infante for examination of her left knee. She is here with her daughter, [name]. She is now approximately two months out from onset. She saw Asencion Partridge earlier this month, had a cortisone injection in the left knee. Reports that, that did seem to help some with pain and swelling. She is still having some instability in the knee, significant trouble going down the stairs due to pain, occasional sharp shooting pains. Her swelling is improved since the injection. She is still noticing some tightness in the posterior aspect of the knee. She has tried Tylenol and Voltaren gel with no relief. Her family doctor gave her tramadol, but it makes her sleepy. She still  works during the day. Therefore, she is not really taking the tramadol because of how tired it made her. She is having trouble sleeping at night due to pain. She did note that after the injection, her pain was so much better. She stretched out the leg, there was lot of popping and creaking and she is not sure what that was, but it did seem to worsen her pain temporarily. She had x-rays initially ordered by her PCP. Denies any prior issues with this knee, has had an arthroscopy on the other knee.  Problem List/Past Medical Hx Gastroesophageal Reflux Disease  High blood pressure  Ulcerative Colitis   Allergies Codeine 04/25/1998 Penicillins  Rash.  Family History Bleeding disorder  grandmother fathers side Cancer  child Cerebrovascular Accident  father Drug / Alcohol Addiction  brother and grandmother fathers side Heart Disease  mother and brother Heart disease in female family member before age 76  Hypertension  mother and brother Osteoarthritis  sister First Degree Relatives   Social History  Tobacco use  former smoker Alcohol use  current drinker; 5-7 per week Children  3 Drug/Alcohol Rehab (Currently)  no Drug/Alcohol Rehab (Previously)  no Exercise  Exercises rarely; does running / walking Illicit drug use  no Living situation  live alone Marital status  widowed Number of flights of stairs before winded  greater than 5 Pain Contract  no Tobacco / smoke exposure  no  Medication History Aspirin (81MG Tablet, Oral) Active. Calcium Carbonate (Oral) Specific strength unknown - Active. Co Q 10 (Oral) Specific strength unknown - Active. Cyanocobalamin (500MCG Tablet, Oral) Active. Irbesartan (75MG Tablet, Oral) Active. Mesalamine (1.2GM Tablet  DR, Oral) Active. Mometasone Furoate (50MCG/ACT Suspension, Nasal) Active. Omeprazole (20MG Capsule DR, Oral) Active. Pitavastatin Calcium (4MG Tablet, Oral) Active. Ursodiol (300MG Capsule, Oral)  Active. Voltaren (1% Gel, Transdermal) Active. Medications Reconciled  Pregnancy / Birth History Pregnant  no  Past Surgical History  Arthroscopy of Knee  right Cataract Surgery  bilateral Colectomy  partial Colon Polyp Removal - Open   Physical Exam On exam, she is well nourished, well developed, awake, alert, and oriented x3, in no acute distress. On examination of the left knee, trace effusion. Tender to palpation through the mid medial joint space, midlateral joint space, nontender patella, patellar tendon, quad tendon, fibular head, peroneal nerve. She is mildly tender through the popliteal space. There is a palpable Baker cyst. This does feel as though it is smaller in size than what is apparent on the MRI from a week and a half ago. Range of motion approximately 0 to 110 degrees. No pain or laxity with varus or valgus stress. No calf pain or sign of DVT. No instability. Sensation intact.  RADIOGRAPHS Prior x-rays reviewed, nonweightbearing, with some moderate medial joint space narrowing. Updated x-rays ordered, obtained, reviewed today, weightbearing AP, lateral, and merchant view. No fracture, subluxation, dislocation, lytic or blastic lesions. Moderate medial joint space narrowing. She does have a slight lateral patellar tilt bilaterally. Joint space is maintained. There is no collapse.  MRI images and report reviewed today by Dr. Tonita Cong. There is a complex lateral meniscus tear with a displaced fragment. A small free edge tibial surface medial meniscus tear, some diffuse chondral thinning laterally as well as a small area that does appear to be potentially a full thickness chondral loss, mid to posterior lateral tibial plateau. Moderate-to-large effusion, leaking Baker cyst, larger in size.     Assessment & Plan Primary osteoarthritis of one knee, left (M17.12) Acute medial meniscus tear of left knee, subsequent encounter (T15.726O) Acute lateral meniscus tear of left  knee, subsequent encounter (M35.597C)  1. Ongoing left knee pain, mechanical in nature, with instability symptoms due to medial and lateral meniscus tears. Two months out from onset, had a prior history of a fall 6 months ago. 2. Underlying left knee osteoarthritis. 3. Baker cyst, left knee, likely secondary to her meniscal tears.  We discussed relevant anatomy, etiology of her pain in detail, discussed activity modifications to what exacerbation and discussed treatment options. She is somewhat better following the cortisone injection, however, still has pain and instability as well as some posterior swelling in her Baker cyst. Given her MRI findings, duration of her symptoms, and ongoing pain and swelling and instability, it would be reasonable to proceed with a left knee arthroscopy, partial medial and lateral menisectomies and debridement. We discussed the procedure itself as well as the risks, complications, and alternatives including, but not limited to DVT, PE, failure of procedure, need for secondary procedure anesthesia risk or even death, discussed postop protocols, crutches for 1 or 2 days. Home exercise program, ice, elevation, pain medication, she typically does not tolerate codeine well, it makes her sick. She has a rash with penicillin, but no swelling that she recalls. No MRSA, no DVT. We will obtain clearance from her PCP, Dr. Joylene Draft as well as Dr. Benay Spice who followed her for her colon cancer and has not technically released her yet. We discussed the possibility of ongoing swelling and fluid in the Baker cyst postoperatively, which could require aspiration and injection. Also discussed with her underlying osteoarthritis, possible need for future injections in  the knee due to arthritic symptoms. At this point, given the fact that her joint space is well maintained, we would not recommend discussing any type of joint replacement. Discussed the very rare possibility that we could have to  surgically remove the Baker cyst. We will obtain clearance and then proceed accordingly. She will follow up 10 to 14 days postop for suture removal. Call if any questions or concerns in the interim. The patient was seen in conjunction with Dr. Tonita Cong today.  I had a long discussion with the patient concerning the risks and benefits of knee arthroscopy including help from the arthroscopic procedure as well as no help from the arthroscopic procedure or worsening of symptoms. Also discussed infection, DVT, PE, anesthetic complications, etc. Also discussed the possibility of repeat arthroscopic surgery required in the future or total knee replacement. I provided the patient with an illustrated handout and discussed it in detail as well as discussed the postoperative and perioperative courses and return to functional activities including work. Need for postoperative DVT prophylaxis was discussed as well.  Plan left knee arthroscopy, partial medial and lateral meniscectomies, debridement   Signed electronically by Lacie Draft, PA-C for Dr. Tonita Cong

## 2015-08-23 NOTE — Brief Op Note (Signed)
08/23/2015  11:10 AM  PATIENT:  Heidi [name]  81 y.o. female  PRE-OPERATIVE DIAGNOSIS:  LEFT KNEE MEDIAL AND LATERAL MENISCUS TEAR,DJD AND BAKERS CYST  POST-OPERATIVE DIAGNOSIS:  LEFT KNEE MEDIAL AND LATERAL MENISCUS TEAR,DJD AND BAKERS CYST  PROCEDURE:  Procedure(s): ARTHROSCOPY KNEE PARTIAL MEDIAL AND LATERAL MENISCECTOMY AND DEBRIDMENT (Left)  SURGEON:  Surgeon(s) and Role:    * Susa Day, MD - Primary  PHYSICIAN ASSISTANT:   ASSISTANTS: Bissell   ANESTHESIA:   general  EBL:     BLOOD ADMINISTERED:none  DRAINS: none   LOCAL MEDICATIONS USED:  MARCAINE     SPECIMEN:  No Specimen  DISPOSITION OF SPECIMEN:  N/A  COUNTS:  YES  TOURNIQUET:  * No tourniquets in log *  DICTATION: .Other Dictation: Dictation Number [id number]  PLAN OF CARE: Discharge to home after PACU  PATIENT DISPOSITION:  PACU - hemodynamically stable.   Delay start of Pharmacological VTE agent (>24hrs) due to surgical blood loss or risk of bleeding: no

## 2015-08-24 ENCOUNTER — Encounter (HOSPITAL_COMMUNITY): Payer: Self-pay | Admitting: Specialist

## 2015-08-24 NOTE — Op Note (Signed)
[name], Heidi [name]              ACCOUNT NO.:  [id number]  MEDICAL RECORD NO.:  [id number]  LOCATION:  [facility]                         FACILITY:  [facility]  PHYSICIAN:  Susa Day, M.D.    DATE OF BIRTH:  13-Nov-1934  DATE OF PROCEDURE:  08/23/2015 DATE OF DISCHARGE:  08/23/2015                              OPERATIVE REPORT   PREOPERATIVE DIAGNOSIS:  Medial lateral meniscus tear, left knee.  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  PROCEDURE PERFORMED: 1. Left knee arthroscopy. 2. Partial medial and lateral meniscectomies. 3. Chondroplasty lateral tibial plateau.  ANESTHESIA:  General.  ASSISTANT:  Lacie Draft, PA, was needed __________ knee and holding.  HISTORY:  81, knee pain, locking, giving way, MRI, mechanical symptoms indicated for partial meniscectomy and debridement.  Risks and benefits were discussed including bleeding, infection, damage to neurovascular structures, DVT, PE, anesthetic complications, etc.  TECHNIQUE:  Patient in supine position, after induction of adequate general anesthesia, 600 clinda, left lower extremity was prepped and draped in usual sterile fashion.  Lateral parapatellar portal was fashioned with a #11 blade.  Ingress cannula atraumatically placed. Irrigant was utilized to insufflate the joint.  Under direct visualization, medial parapatellar portal was fashioned with a #11 blade after localization with 18-gauge needle sparing the medial meniscus, we used 65 mmHg.  Noted was extensive tearing of posterior half medial meniscus, introduced a basket, resected 30% of the anterior half to a stable base, further contoured with 3.5 Cuda shaver.  No significant chondral changes were noted here.  ACL was unremarkable.  Lateral compartment, extensive tearing of the anterior half of the medial meniscus to its base, we resected the meniscus to a stable  base utilizing a straight basket, 3.5 shaver and an Arthro Wand to stabilize the meniscus and resected to a stable base approximately 40% of the anterior and medial aspect of the lateral meniscus was excised, preserving its attachment.  The remnant was stable to probe palpation anteriorly and posteriorly.  Grade 3-4 changes in the posterolateral condyle.  Light chondroplasty performed here.  Remnant meniscus stable to probe palpation.  Femoral condyle was unremarkable.  Some minor grade 3 changes of the patella.  Light chondroplasty performed here.  Normal patellofemoral tracking.  Gutters unremarkable, revisited all compartments.  No further pathology amenable to arthroscopic intervention.  Therefore, removed all instrumentation. Portals were closed with 4-0 nylon and simple sutures, 0.25% Marcaine with epinephrine was infiltrated in the joint.  Wound was dressed sterilely, awoken without difficulty and transported to recovery room in satisfactory condition.  The patient tolerated the procedure well.  No complications.  Minimal blood loss.     Susa Day, M.D.     Geralynn Rile  D:  08/23/2015  T:  08/23/2015  Job:  [id number]

## 2015-09-04 DIAGNOSIS — S83242D Other tear of medial meniscus, current injury, left knee, subsequent encounter: Secondary | ICD-10-CM | POA: Diagnosis not present

## 2015-09-04 DIAGNOSIS — S83282D Other tear of lateral meniscus, current injury, left knee, subsequent encounter: Secondary | ICD-10-CM | POA: Diagnosis not present

## 2015-09-28 ENCOUNTER — Other Ambulatory Visit: Payer: Self-pay | Admitting: *Deleted

## 2015-09-28 MED ORDER — URSODIOL 300 MG PO CAPS: ORAL_CAPSULE | ORAL | Status: DC

## 2015-10-02 DIAGNOSIS — S83242D Other tear of medial meniscus, current injury, left knee, subsequent encounter: Secondary | ICD-10-CM | POA: Diagnosis not present

## 2015-10-02 DIAGNOSIS — Z4789 Encounter for other orthopedic aftercare: Secondary | ICD-10-CM | POA: Diagnosis not present

## 2015-11-13 ENCOUNTER — Encounter: Payer: Self-pay | Admitting: Gastroenterology

## 2015-11-13 DIAGNOSIS — S83242D Other tear of medial meniscus, current injury, left knee, subsequent encounter: Secondary | ICD-10-CM | POA: Diagnosis not present

## 2015-11-13 DIAGNOSIS — Z4789 Encounter for other orthopedic aftercare: Secondary | ICD-10-CM | POA: Diagnosis not present

## 2015-11-27 DIAGNOSIS — H903 Sensorineural hearing loss, bilateral: Secondary | ICD-10-CM | POA: Diagnosis not present

## 2015-12-03 ENCOUNTER — Other Ambulatory Visit: Payer: Self-pay | Admitting: Internal Medicine

## 2015-12-11 ENCOUNTER — Other Ambulatory Visit (INDEPENDENT_AMBULATORY_CARE_PROVIDER_SITE_OTHER): Payer: Medicare Other

## 2015-12-11 DIAGNOSIS — K743 Primary biliary cirrhosis: Secondary | ICD-10-CM

## 2015-12-11 LAB — HEPATIC FUNCTION PANEL
ALT: 17 U/L (ref 0–35)
AST: 21 U/L (ref 0–37)
Albumin: 4 g/dL (ref 3.5–5.2)
Alkaline Phosphatase: 68 U/L (ref 39–117)
BILIRUBIN DIRECT: 0.1 mg/dL (ref 0.0–0.3)
TOTAL PROTEIN: 7.1 g/dL (ref 6.0–8.3)
Total Bilirubin: 0.6 mg/dL (ref 0.2–1.2)

## 2015-12-12 ENCOUNTER — Encounter: Payer: Self-pay | Admitting: Gastroenterology

## 2015-12-20 ENCOUNTER — Other Ambulatory Visit (HOSPITAL_BASED_OUTPATIENT_CLINIC_OR_DEPARTMENT_OTHER): Payer: Medicare Other

## 2015-12-20 ENCOUNTER — Telehealth: Payer: Self-pay | Admitting: Oncology

## 2015-12-20 ENCOUNTER — Ambulatory Visit (HOSPITAL_BASED_OUTPATIENT_CLINIC_OR_DEPARTMENT_OTHER): Payer: Medicare Other | Admitting: Oncology

## 2015-12-20 VITALS — BP 158/85 | HR 66 | Temp 97.8°F | Resp 17 | Ht 62.0 in | Wt 129.4 lb

## 2015-12-20 DIAGNOSIS — I10 Essential (primary) hypertension: Secondary | ICD-10-CM

## 2015-12-20 DIAGNOSIS — C180 Malignant neoplasm of cecum: Secondary | ICD-10-CM

## 2015-12-20 LAB — CEA (IN HOUSE-CHCC): CEA (CHCC-IN HOUSE): 5.63 ng/mL — AB (ref 0.00–5.00)

## 2015-12-20 NOTE — Progress Notes (Signed)
  [place] OFFICE PROGRESS NOTE   Diagnosis: Colon cancer  INTERVAL HISTORY:   Heidi [name] returns as scheduled. She feels well. She is now followed by Dr. Havery Moros for colon cancer surveillance, ulcerative colitis, and primary biliary cirrhosis. She reports occasional bright red blood per rectum, approximately twice per year.   Objective:  Vital signs in last 24 hours:  Blood pressure (!) 158/85, pulse 66, temperature 97.8 F (36.6 C), temperature source Oral, resp. rate 17, height 5' 2"  (1.575 m), weight 129 lb 6.4 oz (58.7 kg), SpO2 98 %.    HEENT: Neck without mass Lymphatics: No cervical, supraclavicular, axillary, or inguinal nodes Resp: Lungs clear bilaterally Cardio: Regular rate and rhythm GI: No hepatosplenomegaly, nontender, no mass Vascular: No leg edema Rectal: External hemorrhoids, stool in the rectal vault, no mass, no blood on the examination glove      Lab Results:  CEA pending Medications: I have reviewed the patient's current medications.  Assessment/Plan: 1. 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 history of Colon polyps  3. Mildly elevated postoperative CEA , persistently elevated following surgery   PET scan 03/15/2013-negative for metastatic disease 4. History of tobacco use -she reports no tobacco use since September of 2013 5. Ulcerative colitis  6. Primary biliary cirrhosis  7. 4 mm right lung nodule on the PET scan 03/15/2013 -reviewed with radiology, follow-up CT not recommended 8. Hypertension     Disposition:  Ms. Anagnos remains in clinical remission from colon cancer. She is now greater than 3 years out from a stage I adenocarcinoma of the cecum.  The mildly elevated CEA is most likely a benign finding. We will follow-up  on the CEA from today. She would like to continue clinical follow-up with Dr. Havery Moros and Dr. Joylene Draft. She will be discharged from the Oncology clinic. I am available to see her in the future as needed.  She would like to continue surveillance colonoscopies and will discuss this with Dr. Havery Moros next month.  Betsy Coder, MD  12/20/2015  3:23 PM

## 2015-12-20 NOTE — Telephone Encounter (Signed)
Gave patient avs report. Per 8/17 LOS f/u TBA

## 2015-12-21 ENCOUNTER — Telehealth: Payer: Self-pay | Admitting: *Deleted

## 2015-12-21 LAB — CEA (PARALLEL TESTING): CEA: 6.1 ng/mL — AB

## 2015-12-21 NOTE — Telephone Encounter (Signed)
Unable to reach pt, lmovm "Per MD, cea is mildly elevated- stable" Request pt call office to confirm message was received.

## 2015-12-21 NOTE — Telephone Encounter (Signed)
-----   Message from Ladell Pier, MD sent at 12/20/2015  5:24 PM EDT ----- Please call patient, Heidi [name] is mildly elevated-stable

## 2016-01-02 ENCOUNTER — Telehealth: Payer: Self-pay | Admitting: *Deleted

## 2016-01-02 MED ORDER — URSODIOL 300 MG PO CAPS: ORAL_CAPSULE | ORAL | 2 refills | Status: DC

## 2016-01-02 NOTE — Telephone Encounter (Signed)
Refilled Ursodiol electronically

## 2016-01-10 ENCOUNTER — Ambulatory Visit (INDEPENDENT_AMBULATORY_CARE_PROVIDER_SITE_OTHER): Payer: Medicare Other | Admitting: Gastroenterology

## 2016-01-10 ENCOUNTER — Encounter: Payer: Self-pay | Admitting: Gastroenterology

## 2016-01-10 VITALS — BP 150/80 | HR 68 | Ht 62.0 in | Wt 129.3 lb

## 2016-01-10 DIAGNOSIS — C189 Malignant neoplasm of colon, unspecified: Secondary | ICD-10-CM | POA: Diagnosis not present

## 2016-01-10 DIAGNOSIS — K219 Gastro-esophageal reflux disease without esophagitis: Secondary | ICD-10-CM

## 2016-01-10 DIAGNOSIS — K5150 Left sided colitis without complications: Secondary | ICD-10-CM

## 2016-01-10 DIAGNOSIS — K743 Primary biliary cirrhosis: Secondary | ICD-10-CM | POA: Diagnosis not present

## 2016-01-10 DIAGNOSIS — R1310 Dysphagia, unspecified: Secondary | ICD-10-CM

## 2016-01-10 MED ORDER — RANITIDINE HCL 150 MG PO TABS: 150.0000 mg | ORAL_TABLET | Freq: Two times a day (BID) | ORAL | 3 refills | Status: DC

## 2016-01-10 NOTE — Patient Instructions (Signed)
We have sent the following medications to your pharmacy for you to pick up at your convenience:  Zantac  You have been scheduled for an endoscopy. Please follow written instructions given to you at your visit today. If you use inhalers (even only as needed), please bring them with you on the day of your procedure. Your physician has requested that you go to www.startemmi.com and enter the access code given to you at your visit today. This web site gives a general overview about your procedure. However, you should still follow specific instructions given to you by our office regarding your preparation for the procedure.   Please follow up in 6 months

## 2016-01-10 NOTE — Progress Notes (Signed)
HPI :  81 y/o female here for follow up for multiple issues. History of left sided UC, colon cancer (cecum 2014), and history of PBC, history of GERD  UC - left sided disease, diagnosed around she thinks 12 years or so. She takes Lialda 2.4gm daily. She has formed stools, usually 1-2 per day, rarely up to 5-6 times per day. She thinks she has higher frequency since her colon resection for colon cancer but has been overall stable. She has not been on anything else for colitis historially, no prior hospitalizations. Last colonoscopy in 2016 showed remission. No abdominal pains  Colon cancer - cecum, diagnosed in 2014, and had surgical resection for this. She has had colonoscopy in 2015 and 2016 since diagnosis. No polyps removed. CEA level has remained mildly elevated. Followed by Dr. Ammie Dalton in Oncology.   PBC - diagnosed via liver biopsy around 2008. adjusted dosed to 636m one day and 904mthe other for weight based dosing (dose should be between 747 and 86264mer day). US Koreane in January showing dilated CBD to 8.6mm74mhis led to subsequent MRCP which showed 8mm 51m, per radiology within normal age related change. No abnormality otherwise. LFTs were otherwise normal. She is seeing Dr. PerinJoylene Draftovember, will get vitamin D and TSH done, as well as DEXA.  GERD - she is taking omeprazole about 5 days per week. She as not tried zantac for her symptoms. Her symptoms are pretty well controlled on the omeprazole. She has been having some intermittent dysphagia to some solids. Prior [facility]2010[facility] 18[facility],64m ulcers noted in the pylorus. She has not had an EGD since that time. She reports mild symptoms.    Past Medical History:  Diagnosis Date  . Allergy   . Arthritis   . Basal cell carcinoma of forehead ~ 2008  . Chronic lower back pain   . Cirrhosis, primary biliary (HCC)  [place]   . Dysrhythmia   . GERD (gastroesophageal reflux  disease)   . Hard of hearing    bilat   . Heart murmur   . High cholesterol   . History of hiatal hernia   . Hx of colon cancer, stage I   . Hypertension   . Macular degeneration   . Multiple falls   . Osteoporosis    osteopenia  . Raynaud disease    hands   . Ulcerative colitis   . Venous insufficiency    lower legs     Past Surgical History:  Procedure Laterality Date  . APPENDECTOMY    . CATARACT EXTRACTION W/ INTRAOCULAR LENS  IMPLANT, BILATERAL Bilateral ~ 2008  . COLONOSCOPY    . KNEE ARTHROSCOPY Right 1990's  . KNEE ARTHROSCOPY Left 08/23/2015   Procedure: ARTHROSCOPY KNEE PARTIAL MEDIAL AND LATERAL MENISCECTOMY AND DEBRIDMENT;  Surgeon: JeffreSusa Day Location: [facility];  Service: Orthopedics;  Laterality: Left;  . LAPAROSCOPIC PARTIAL COLECTOMY N/A 09/15/2012   Procedure: LAPAROSCOPIC ILEOCOLECTOMY;  Surgeon: Faera Stark Klein [place]: [facility];[place];  Laterality: N/A;  . LAPAROSCOPIC RIGHT COLON RESECTION  09/15/2012  . MOUTH SURGERY    . POLYPECTOMY    . TONSILLECTOMY  1952  . TUBAL LIGATION  1973   Family History  Problem Relation Age of Onset  . Heart disease Mother   . Heart disease Brother   . Stroke Father   . Lung cancer Brother   . Breast  cancer Paternal Aunt   . Ovarian cancer Paternal Grandmother   . Colon cancer Neg Hx   . Esophageal cancer Neg Hx   . Stomach cancer Neg Hx   . Pancreatic cancer Neg Hx    Social History  Substance Use Topics  . Smoking status: Former Smoker    Packs/day: 0.50    Years: 40.00    Types: Cigarettes    Quit date: 01/16/2012  . Smokeless tobacco: Never Used  . Alcohol use 6.0 oz/week    10 Glasses of wine per week   Current Outpatient Prescriptions  Medication Sig Dispense Refill  . aspirin 81 MG tablet Take 81 mg by mouth at bedtime.     . calcium carbonate (OS-CAL) 600 MG TABS Take 600 mg by mouth daily.     . Coenzyme Q10 (COQ-10) 200 MG CAPS Take 1 capsule by mouth daily.    .  cyanocobalamin 500 MCG tablet Take 500 mcg by mouth daily.     Marland Kitchen Dexamethasone (ZEMA-PAK 13 DAY PO) Take by mouth as directed.    . ezetimibe (ZETIA) 10 MG tablet Take 5 mg by mouth 2 (two) times a week. Wednesday and Sunday.    . fexofenadine (ALLEGRA) 180 MG tablet Take 180 mg by mouth daily as needed for allergies or rhinitis.    Marland Kitchen irbesartan (AVAPRO) 75 MG tablet Take 75 mg by mouth daily.    . mesalamine (LIALDA) 1.2 g EC tablet Take 2 tablets (2.4 g total) by mouth daily with breakfast. 180 tablet 1  . Multiple Vitamins-Minerals (PRESERVISION AREDS PO) Take 1 tablet by mouth 2 (two) times daily.    . naproxen (NAPROSYN) 500 MG tablet Take 1 tablet by mouth 3 (three) times daily.  0  . omeprazole (PRILOSEC) 20 MG capsule TAKE ONE CAPSULE EACH DAY 30 capsule 6  . Pitavastatin Calcium (LIVALO) 4 MG TABS Take 0.5 tablets by mouth 2 (two) times a week. Wednesday and Sunday    . ursodiol (ACTIGALL) 300 MG capsule TAKE TWO CAPSULES EVERY DAY ALTERNATING WITH 3 CAPSULES DAILY 75 capsule 2  . ranitidine (ZANTAC) 150 MG tablet Take 1 tablet (150 mg total) by mouth 2 (two) times daily. 90 tablet 3   No current facility-administered medications for this visit.    Allergies  Allergen Reactions  . Codeine Nausea And Vomiting  . Penicillins Hives     Review of Systems: All systems reviewed and negative except where noted in HPI.   Lab Results  Component Value Date   WBC 4.9 08/10/2015   HGB 14.9 08/10/2015   HCT 45.1 08/10/2015   MCV 96.4 08/10/2015   PLT 252 08/10/2015   Lab Results  Component Value Date   CREATININE 0.81 08/10/2015   BUN 17 08/10/2015   NA 140 08/10/2015   K 4.5 08/10/2015   CL 105 08/10/2015   CO2 28 08/10/2015    Lab Results  Component Value Date   ALT 17 12/11/2015   AST 21 12/11/2015   ALKPHOS 68 12/11/2015   BILITOT 0.6 12/11/2015      Physical Exam: BP (!) 150/80   Pulse 68   Ht 5' 2"  (1.575 m)   Wt 129 lb 4.8 oz (58.7 kg)   BMI 23.65 kg/m    Constitutional: Pleasant,well-developed, female in no acute distress. HEENT: Normocephalic and atraumatic. Conjunctivae are normal. No scleral icterus. Neck supple.  Cardiovascular: Normal rate, regular rhythm.  Pulmonary/chest: Effort normal and breath sounds normal. No wheezing, rales or rhonchi. Abdominal:  Soft, nondistended, nontender. Bowel sounds active throughout.  Extremities: no edema Lymphadenopathy: No cervical adenopathy noted. Neurological: Alert and oriented to person place and time. Skin: Skin is warm and dry. No rashes noted. Psychiatric: Normal mood and affect. Behavior is normal.   ASSESSMENT AND PLAN: 81 y/o female here for reassessment of the following issues:  PBC - diagnosed with liver biopsy historically. AP and ALT have been normal. Continue Ursodiol dosing at 13-52m/kg. She is due for TSH and vitamin D in November, as well as DEXA scan. MRCP as above, only age related changes. Would recheck LFTs in 6 months.   Left sided UC - stable on Lialda, last colonoscopy in 2016 showing remission. She will warrant surveillance colonoscopy every 1-2 years when she has had disease for 15 years. With her colon cancer diagnosis we discussed timing of surveillance as below.   History of colon cancer - due for surveillance colonoscopy in 3 years from her last exam per guidelines. Her last exam was just over a year ago and her last 2 exams have not shown any polyps. I reassured her in this light, however, she is quite anxious about this and had expected to have colonoscopy every year for her colon cancer history. We again discussed guideline recommendations for this issue, however ultimately I offered her the procedure for piece of mind if she was anxious about it. She wanted to think about it and we will discuss it again in 6 months at her follow up.   Dysphagia - history of GEJ stricture s/p dilation in the past which helped her symptoms. She also had multiple small pyloric ulcers  noted on last EGD. I offered her an EGD with dilation given her symptoms, and she wished to proceed following discussion of risks / benefits.   GERD - she wishes to try and come off omeprazole if possible given potential risks, will start Zantac 1526mq day to BID, taper off omeprazole slowly.   StCarolina CellarMD [facility] Pager [phone]

## 2016-01-15 DIAGNOSIS — H9201 Otalgia, right ear: Secondary | ICD-10-CM | POA: Diagnosis not present

## 2016-01-15 DIAGNOSIS — H6983 Other specified disorders of Eustachian tube, bilateral: Secondary | ICD-10-CM | POA: Diagnosis not present

## 2016-01-15 DIAGNOSIS — I10 Essential (primary) hypertension: Secondary | ICD-10-CM | POA: Diagnosis not present

## 2016-01-15 DIAGNOSIS — Z6823 Body mass index (BMI) 23.0-23.9, adult: Secondary | ICD-10-CM | POA: Diagnosis not present

## 2016-01-29 DIAGNOSIS — J320 Chronic maxillary sinusitis: Secondary | ICD-10-CM | POA: Diagnosis not present

## 2016-01-29 DIAGNOSIS — H903 Sensorineural hearing loss, bilateral: Secondary | ICD-10-CM | POA: Diagnosis not present

## 2016-01-29 DIAGNOSIS — H902 Conductive hearing loss, unspecified: Secondary | ICD-10-CM | POA: Diagnosis not present

## 2016-01-29 DIAGNOSIS — H6521 Chronic serous otitis media, right ear: Secondary | ICD-10-CM | POA: Diagnosis not present

## 2016-01-29 DIAGNOSIS — J322 Chronic ethmoidal sinusitis: Secondary | ICD-10-CM | POA: Diagnosis not present

## 2016-02-08 ENCOUNTER — Encounter: Payer: Medicare Other | Admitting: Gastroenterology

## 2016-02-16 DIAGNOSIS — Z23 Encounter for immunization: Secondary | ICD-10-CM | POA: Diagnosis not present

## 2016-02-19 DIAGNOSIS — H6523 Chronic serous otitis media, bilateral: Secondary | ICD-10-CM | POA: Diagnosis not present

## 2016-02-19 DIAGNOSIS — J320 Chronic maxillary sinusitis: Secondary | ICD-10-CM | POA: Diagnosis not present

## 2016-02-19 DIAGNOSIS — H903 Sensorineural hearing loss, bilateral: Secondary | ICD-10-CM | POA: Diagnosis not present

## 2016-02-19 DIAGNOSIS — J322 Chronic ethmoidal sinusitis: Secondary | ICD-10-CM | POA: Diagnosis not present

## 2016-03-14 ENCOUNTER — Ambulatory Visit (AMBULATORY_SURGERY_CENTER): Payer: Medicare Other | Admitting: Gastroenterology

## 2016-03-14 ENCOUNTER — Encounter: Payer: Self-pay | Admitting: Gastroenterology

## 2016-03-14 VITALS — BP 170/85 | HR 67 | Temp 96.6°F | Resp 16 | Ht 62.0 in | Wt 129.0 lb

## 2016-03-14 DIAGNOSIS — R1319 Other dysphagia: Secondary | ICD-10-CM | POA: Diagnosis not present

## 2016-03-14 DIAGNOSIS — R1310 Dysphagia, unspecified: Secondary | ICD-10-CM | POA: Diagnosis not present

## 2016-03-14 DIAGNOSIS — K222 Esophageal obstruction: Secondary | ICD-10-CM

## 2016-03-14 MED ORDER — SODIUM CHLORIDE 0.9 % IV SOLN: 500.0000 mL | INTRAVENOUS | Status: DC

## 2016-03-14 NOTE — Progress Notes (Signed)
Teeth unchanged after procedure.A and O x3. Report to RN. Tolerated MAC anesthesia well. 

## 2016-03-14 NOTE — Patient Instructions (Signed)
YOU HAD AN ENDOSCOPIC PROCEDURE TODAY AT [facility]:   Refer to the procedure report that was given to you for any specific questions about what was found during the examination.  If the procedure report does not answer your questions, please call your gastroenterologist to clarify.  If you requested that your care partner not be given the details of your procedure findings, then the procedure report has been included in a sealed envelope for you to review at your convenience later.  YOU SHOULD EXPECT: Some feelings of bloating in the abdomen. Passage of more gas than usual.  Walking can help get rid of the air that was put into your GI tract during the procedure and reduce the bloating. If you had a lower endoscopy (such as a colonoscopy or flexible sigmoidoscopy) you may notice spotting of blood in your stool or on the toilet paper. If you underwent a bowel prep for your procedure, you may not have a normal bowel movement for a few days.  Please Note:  You might notice some irritation and congestion in your nose or some drainage.  This is from the oxygen used during your procedure.  There is no need for concern and it should clear up in a day or so.  SYMPTOMS TO REPORT IMMEDIATELY:   Following upper endoscopy (EGD)  Vomiting of blood or coffee ground material  New chest pain or pain under the shoulder blades  Painful or persistently difficult swallowing  New shortness of breath  Fever of 100F or higher  Black, tarry-looking stools  For urgent or emergent issues, a gastroenterologist can be reached at any hour by calling [phone].   Hiatal hernia and post dilation information given.   DIET:  We do recommend a small meal at first, but then you may proceed to your regular diet.  Drink plenty of fluids but you should avoid alcoholic beverages for 24 hours.  ACTIVITY:  You should plan to take it easy for the rest of today and you should NOT DRIVE or use heavy machinery  until tomorrow (because of the sedation medicines used during the test).    FOLLOW UP: Our staff will call the number listed on your records the next business day following your procedure to check on you and address any questions or concerns that you may have regarding the information given to you following your procedure. If we do not reach you, we will leave a message.  However, if you are feeling well and you are not experiencing any problems, there is no need to return our call.  We will assume that you have returned to your regular daily activities without incident.  If any biopsies were taken you will be contacted by phone or by letter within the next 1-3 weeks.  Please call us at [phone] if you have not heard about the biopsies in 3 weeks.    SIGNATURES/CONFIDENTIALITY: You and/or your care partner have signed paperwork which will be entered into your electronic medical record.  These signatures attest to the fact that that the information above on your After Visit Summary has been reviewed and is understood.  Full responsibility of the confidentiality of this discharge information lies with you and/or your care-partner.

## 2016-03-14 NOTE — Op Note (Signed)
[facility] Patient Name: Heidi [name] Procedure Date: 03/14/2016 2:00 PM MRN: [id number] Endoscopist: Remo Lipps P. Armbruster MD, MD Age: 81 Referring MD:  Date of Birth: 10-30-1934 Gender: Female Account #: [id number] Procedure:                Upper GI endoscopy Indications:              Dysphagia Medicines:                Monitored Anesthesia Care Procedure:                Pre-Anesthesia Assessment:                           - Prior to the procedure, a History and Physical                            was performed, and patient medications and                            allergies were reviewed. The patient's tolerance of                            previous anesthesia was also reviewed. The risks                            and benefits of the procedure and the sedation                            options and risks were discussed with the patient.                            All questions were answered, and informed consent                            was obtained. Prior Anticoagulants: The patient has                            taken aspirin, last dose was 1 day prior to                            procedure. ASA Grade Assessment: III - A patient                            with severe systemic disease. After reviewing the                            risks and benefits, the patient was deemed in                            satisfactory condition to undergo the procedure.                           After obtaining informed consent, the endoscope was  passed under direct vision. Throughout the                            procedure, the patient's blood pressure, pulse, and                            oxygen saturations were monitored continuously. The                            Model GIF-[id number]) scope was introduced                            through the mouth, and advanced to the second part                            of duodenum. The upper GI  endoscopy was                            accomplished without difficulty. The patient                            tolerated the procedure well. Scope In: Scope Out: Findings:                 Esophagogastric landmarks were identified: the                            Z-line was found at 36 cm, the gastroesophageal                            junction was found at 36 cm and the upper extent of                            the gastric folds was found at 39 cm from the                            incisors.                           A 3 cm hiatal hernia was present.                           A widely patent Schatzki ring was found at the                            gastroesophageal junction. Unclear if this is                            causing dysphagia or not given it was very mild. A                            guidewire was placed and the scope was withdrawn.  Dilation was performed with a Savary dilator with                            no resistance at 17 mm and 18 mm. A relook exam                            with the endoscope did not show any evidence of a                            mucosal wrent.                           The exam of the esophagus was otherwise normal.                           The entire examined stomach was normal.                           The duodenal bulb and second portion of the                            duodenum were normal. Complications:            No immediate complications. Estimated blood loss:                            Minimal. Estimated Blood Loss:     Estimated blood loss was minimal. Impression:               - Esophagogastric landmarks identified.                           - 3 cm hiatal hernia.                           - Widely patent Schatzki ring - unclear if this is                            related to symptoms or not given it was very mild.                            Entire esophagus dilated to 6m via Savory.                            - Normal stomach.                           - Normal duodenal bulb and second portion of the                            duodenum. Recommendation:           - Patient has a contact number available for                            emergencies. The signs and  symptoms of potential                            delayed complications were discussed with the                            patient. Return to normal activities tomorrow.                            Written discharge instructions were provided to the                            patient.                           - Resume previous diet.                           - Continue present medications.                           - Repeat upper endoscopy PRN for retreatment if                            benefit was found                           - If no benefit from dilation and symptoms persist,                            please contact me for reassessment, may consider                            motility testing Remo Lipps P. Armbruster MD, MD 03/14/2016 2:27:00 PM This report has been signed electronically.

## 2016-03-17 ENCOUNTER — Telehealth: Payer: Self-pay | Admitting: *Deleted

## 2016-03-17 NOTE — Telephone Encounter (Signed)
  Follow up Call-  Call back number 03/14/2016 10/27/2014 10/06/2013  Post procedure Call Back phone  # [phone]  Permission to leave phone message Yes Yes Yes  Some recent data might be hidden     Patient questions:  Do you have a fever, pain , or abdominal swelling? No. Pain Score  0 *  Have you tolerated food without any problems? Yes.    Have you been able to return to your normal activities? Yes.    Do you have any questions about your discharge instructions: Diet   No. Medications  No. Follow up visit  No.  Do you have questions or concerns about your Care? No.  Actions: * If pain score is 4 or above: No action needed, pain <4.

## 2016-03-26 ENCOUNTER — Ambulatory Visit (INDEPENDENT_AMBULATORY_CARE_PROVIDER_SITE_OTHER): Payer: Medicare Other | Admitting: Ophthalmology

## 2016-03-26 DIAGNOSIS — H43813 Vitreous degeneration, bilateral: Secondary | ICD-10-CM

## 2016-03-26 DIAGNOSIS — H35033 Hypertensive retinopathy, bilateral: Secondary | ICD-10-CM

## 2016-03-26 DIAGNOSIS — I10 Essential (primary) hypertension: Secondary | ICD-10-CM | POA: Diagnosis not present

## 2016-03-26 DIAGNOSIS — H353132 Nonexudative age-related macular degeneration, bilateral, intermediate dry stage: Secondary | ICD-10-CM

## 2016-03-29 ENCOUNTER — Other Ambulatory Visit: Payer: Self-pay | Admitting: Internal Medicine

## 2016-04-01 DIAGNOSIS — M859 Disorder of bone density and structure, unspecified: Secondary | ICD-10-CM | POA: Diagnosis not present

## 2016-04-01 DIAGNOSIS — E785 Hyperlipidemia, unspecified: Secondary | ICD-10-CM | POA: Diagnosis not present

## 2016-04-01 DIAGNOSIS — Z0000 Encounter for general adult medical examination without abnormal findings: Secondary | ICD-10-CM | POA: Diagnosis not present

## 2016-04-01 DIAGNOSIS — E784 Other hyperlipidemia: Secondary | ICD-10-CM | POA: Diagnosis not present

## 2016-04-01 DIAGNOSIS — I10 Essential (primary) hypertension: Secondary | ICD-10-CM | POA: Diagnosis not present

## 2016-04-01 DIAGNOSIS — N858 Other specified noninflammatory disorders of uterus: Secondary | ICD-10-CM | POA: Diagnosis not present

## 2016-04-01 DIAGNOSIS — E538 Deficiency of other specified B group vitamins: Secondary | ICD-10-CM | POA: Diagnosis not present

## 2016-04-08 DIAGNOSIS — M5416 Radiculopathy, lumbar region: Secondary | ICD-10-CM | POA: Diagnosis not present

## 2016-04-08 DIAGNOSIS — E784 Other hyperlipidemia: Secondary | ICD-10-CM | POA: Diagnosis not present

## 2016-04-08 DIAGNOSIS — Z1389 Encounter for screening for other disorder: Secondary | ICD-10-CM | POA: Diagnosis not present

## 2016-04-08 DIAGNOSIS — Z6823 Body mass index (BMI) 23.0-23.9, adult: Secondary | ICD-10-CM | POA: Diagnosis not present

## 2016-04-08 DIAGNOSIS — C189 Malignant neoplasm of colon, unspecified: Secondary | ICD-10-CM | POA: Diagnosis not present

## 2016-04-08 DIAGNOSIS — I872 Venous insufficiency (chronic) (peripheral): Secondary | ICD-10-CM | POA: Diagnosis not present

## 2016-04-08 DIAGNOSIS — H6983 Other specified disorders of Eustachian tube, bilateral: Secondary | ICD-10-CM | POA: Diagnosis not present

## 2016-04-08 DIAGNOSIS — M25552 Pain in left hip: Secondary | ICD-10-CM | POA: Diagnosis not present

## 2016-04-08 DIAGNOSIS — Z0000 Encounter for general adult medical examination without abnormal findings: Secondary | ICD-10-CM | POA: Diagnosis not present

## 2016-04-08 DIAGNOSIS — I10 Essential (primary) hypertension: Secondary | ICD-10-CM | POA: Diagnosis not present

## 2016-04-08 DIAGNOSIS — E538 Deficiency of other specified B group vitamins: Secondary | ICD-10-CM | POA: Diagnosis not present

## 2016-04-08 DIAGNOSIS — H9190 Unspecified hearing loss, unspecified ear: Secondary | ICD-10-CM | POA: Diagnosis not present

## 2016-04-10 ENCOUNTER — Telehealth: Payer: Self-pay

## 2016-04-10 ENCOUNTER — Other Ambulatory Visit: Payer: Self-pay

## 2016-04-10 MED ORDER — URSODIOL 300 MG PO CAPS: ORAL_CAPSULE | ORAL | 2 refills | Status: DC

## 2016-04-10 NOTE — Telephone Encounter (Signed)
Received fax from [employer]. Pt is requesting refills of Ursodiol 363m #75. Take 2 caps qd alternating with 3 capsules daily.  Is this ok to refill?

## 2016-04-10 NOTE — Telephone Encounter (Signed)
Yes you can refill. thanks

## 2016-05-29 IMAGING — CR DG LUMBAR SPINE COMPLETE 4+V
5 series · 5 of 5 positions shown · non-contrast
Comparison: CT abdomen and pelvis 10/06/2012.

CLINICAL DATA: Low back pain for 6 months. History of fall in
January 2015. Initial encounter.

EXAM:
LUMBAR SPINE - COMPLETE 4+ VIEW

[t l-spine a.p.]
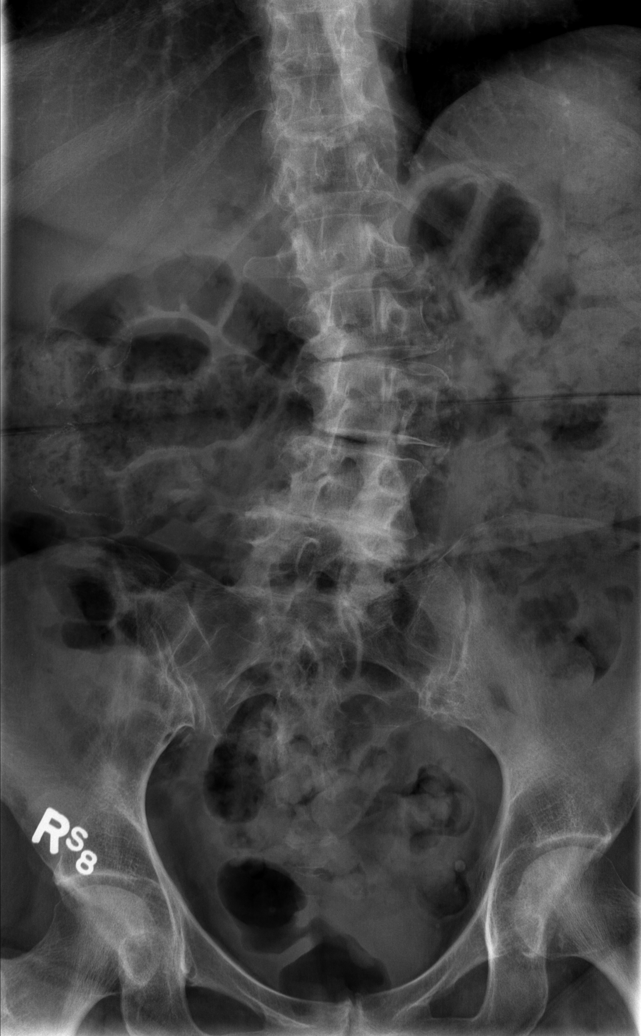

[t l-spine oblique exposure (1 of 2)]
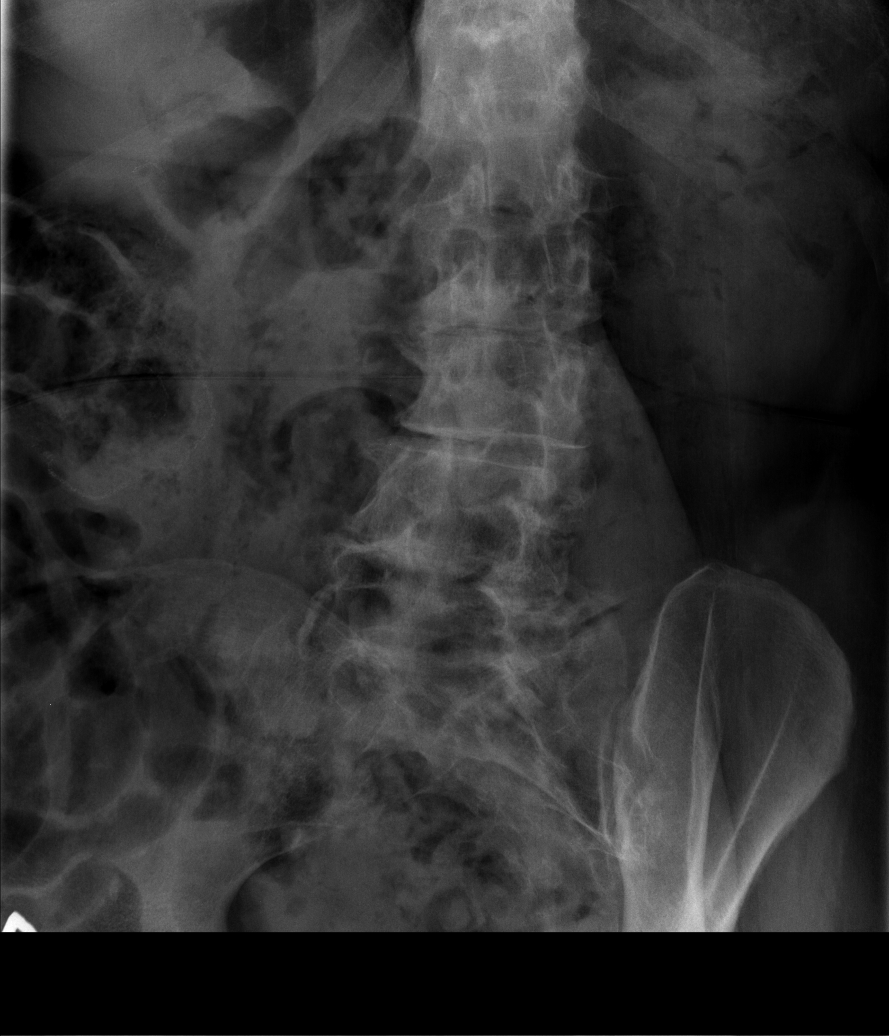

[t l-spine oblique exposure (2 of 2)]
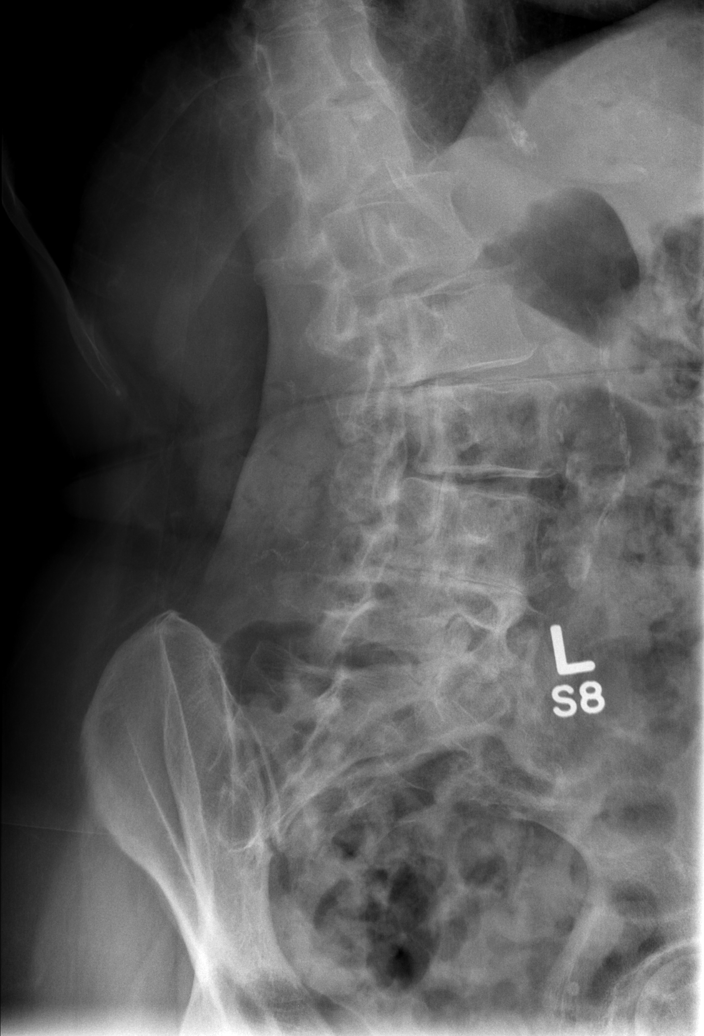

[t l-spine lat]
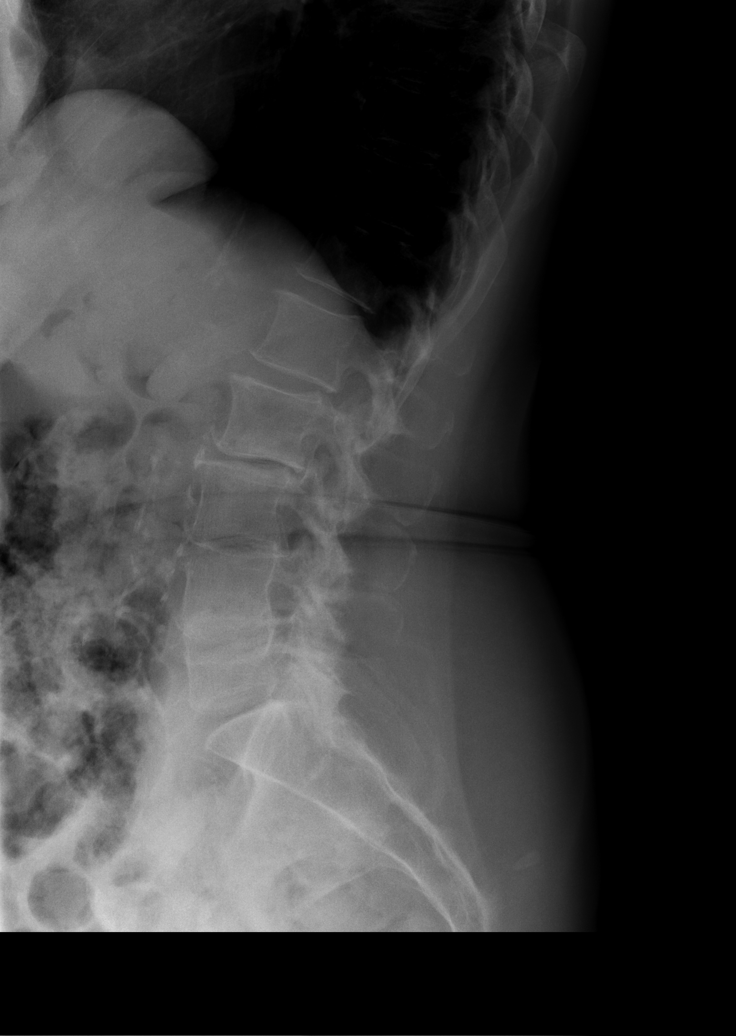

[t l-spine l5-s1 spot]
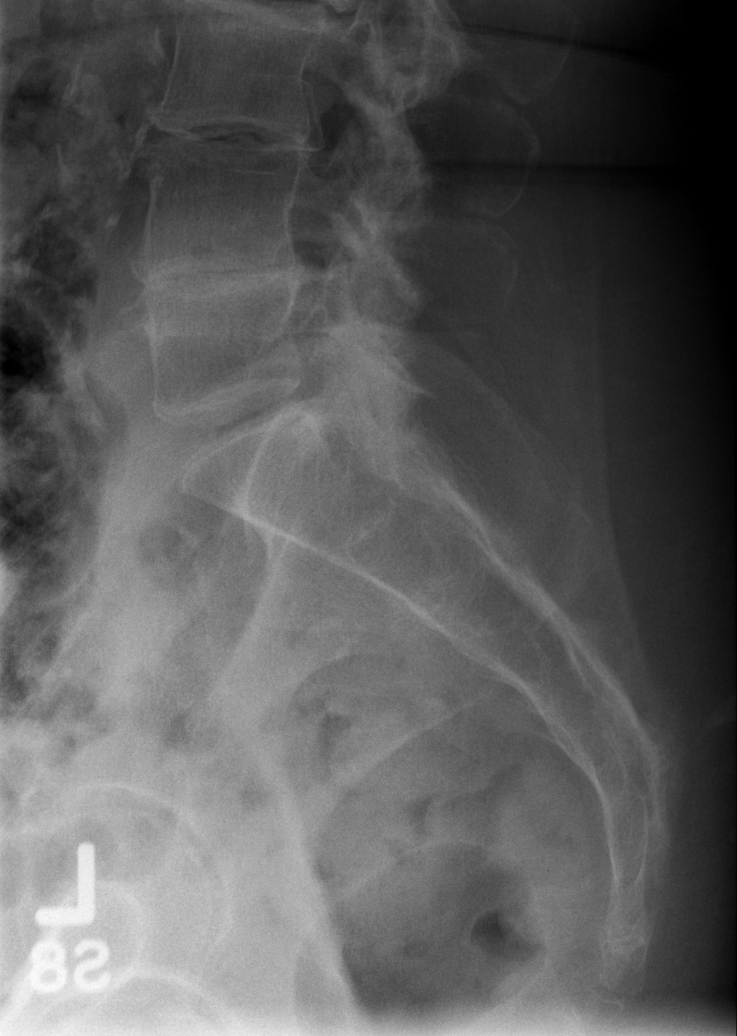

[5 of 5 positions shown; findings below may reference images not displayed]

FINDINGS: There is marked convex left scoliosis with the apex at L2-3. No
fracture is identified. The patient has severe multilevel loss of
disc space height which appears worst at L2-3 and L3-4 progressive
since the prior CT. Mild retrolisthesis L2 on L3 is also identified
and not notably changed. Multilevel facet arthropathy is noted.
IMPRESSION: No acute abnormality.

Marked convex left lumbar scoliosis and multilevel spondylosis.

## 2016-07-08 DIAGNOSIS — M859 Disorder of bone density and structure, unspecified: Secondary | ICD-10-CM | POA: Diagnosis not present

## 2016-07-14 ENCOUNTER — Ambulatory Visit: Payer: Medicare Other | Admitting: Gastroenterology

## 2016-07-21 ENCOUNTER — Other Ambulatory Visit: Payer: Self-pay | Admitting: Gastroenterology

## 2016-07-21 NOTE — Telephone Encounter (Signed)
Yes we can give #[phone] with RF3

## 2016-07-21 NOTE — Telephone Encounter (Signed)
Is Ursodiol ok to refill?

## 2016-08-05 ENCOUNTER — Other Ambulatory Visit (INDEPENDENT_AMBULATORY_CARE_PROVIDER_SITE_OTHER): Payer: Medicare Other

## 2016-08-05 ENCOUNTER — Ambulatory Visit (INDEPENDENT_AMBULATORY_CARE_PROVIDER_SITE_OTHER): Payer: Medicare Other | Admitting: Gastroenterology

## 2016-08-05 ENCOUNTER — Encounter: Payer: Self-pay | Admitting: Gastroenterology

## 2016-08-05 VITALS — BP 120/80 | HR 70 | Resp 16 | Ht 62.0 in | Wt 135.0 lb

## 2016-08-05 DIAGNOSIS — K743 Primary biliary cirrhosis: Secondary | ICD-10-CM | POA: Diagnosis not present

## 2016-08-05 DIAGNOSIS — Z85038 Personal history of other malignant neoplasm of large intestine: Secondary | ICD-10-CM | POA: Diagnosis not present

## 2016-08-05 DIAGNOSIS — K5130 Ulcerative (chronic) rectosigmoiditis without complications: Secondary | ICD-10-CM | POA: Diagnosis not present

## 2016-08-05 DIAGNOSIS — K51219 Ulcerative (chronic) proctitis with unspecified complications: Secondary | ICD-10-CM

## 2016-08-05 LAB — CBC WITH DIFFERENTIAL/PLATELET
BASOS ABS: 0.1 10*3/uL (ref 0.0–0.1)
Basophils Relative: 1 % (ref 0.0–3.0)
EOS PCT: 8.1 % — AB (ref 0.0–5.0)
Eosinophils Absolute: 0.4 10*3/uL (ref 0.0–0.7)
HEMATOCRIT: 43.3 % (ref 36.0–46.0)
HEMOGLOBIN: 14.4 g/dL (ref 12.0–15.0)
LYMPHS PCT: 35.9 % (ref 12.0–46.0)
Lymphs Abs: 2 10*3/uL (ref 0.7–4.0)
MCHC: 33.4 g/dL (ref 30.0–36.0)
MCV: 94.3 fl (ref 78.0–100.0)
MONOS PCT: 9.9 % (ref 3.0–12.0)
Monocytes Absolute: 0.5 10*3/uL (ref 0.1–1.0)
Neutro Abs: 2.5 10*3/uL (ref 1.4–7.7)
Neutrophils Relative %: 45.1 % (ref 43.0–77.0)
Platelets: 198 10*3/uL (ref 150.0–400.0)
RBC: 4.59 Mil/uL (ref 3.87–5.11)
RDW: 13.6 % (ref 11.5–15.5)
WBC: 5.4 10*3/uL (ref 4.0–10.5)

## 2016-08-05 LAB — COMPREHENSIVE METABOLIC PANEL
ALBUMIN: 3.9 g/dL (ref 3.5–5.2)
ALT: 13 U/L (ref 0–35)
AST: 17 U/L (ref 0–37)
Alkaline Phosphatase: 65 U/L (ref 39–117)
BILIRUBIN TOTAL: 0.6 mg/dL (ref 0.2–1.2)
BUN: 14 mg/dL (ref 6–23)
CALCIUM: 9.4 mg/dL (ref 8.4–10.5)
CO2: 29 mEq/L (ref 19–32)
Chloride: 106 mEq/L (ref 96–112)
Creatinine, Ser: 0.7 mg/dL (ref 0.40–1.20)
GFR: 85.58 mL/min (ref >60.00)
GLUCOSE: 97 mg/dL (ref 70–99)
Potassium: 4.6 mEq/L (ref 3.5–5.1)
Sodium: 139 mEq/L (ref 135–145)
Total Protein: 6.9 g/dL (ref 6.0–8.3)

## 2016-08-05 LAB — VITAMIN D 25 HYDROXY (VIT D DEFICIENCY, FRACTURES): VITD: 32.89 ng/mL (ref 30.00–100.00)

## 2016-08-05 LAB — TSH: TSH: 1.41 u[IU]/mL (ref 0.35–4.50)

## 2016-08-05 MED ORDER — NA SULFATE-K SULFATE-MG SULF 17.5-3.13-1.6 GM/177ML PO SOLN: 1.0000 | Freq: Once | ORAL | 0 refills | Status: AC

## 2016-08-05 NOTE — Progress Notes (Signed)
HPI :  82 y/o female well known to me, here to follow up with issues as outlined below:  Dysphagia / reflux - EGD done on 03/14/16 - 3cm hiatal hernia, widely patent Shatski ring, diilated to 63m, otherwise normal exam. Since her dilation she has had resolution of her dysphagia, not bothering her much any longer. She is taking omeprazole for her reflux, working well for her at 219mdosing. Zantac did not help too much when we tried to transition her off PPI.   UC - left sided colitis - diagnosed around 13 years ago. Treated with mesalamine monotherapy. Having some constipation at times, rare loose stools, bowels a bit irregular. Usually one BM per day, sometimes more. No blood in the stools. Last colonoscopy in 2016 showed she was in remission. Taking Lialda 2 tabs per day and tolerating it well.   Colon cancer - cecum, diagnosed in 2014, and had surgical resection for this. She has had colonoscopy in 2015 and 2016 since diagnosis. No polyps removed. CEA level has remained mildly elevated. Followed by Dr. [facility].  PBC - diagnosed via liver biopsy around 2008. adjusted dosed to 60065mne day and 900m33me other for weight based dosing US dKoreae in January 2017 showing dilated CBD to 8.6mm.66mis led to subsequent MRCP which showed 8mm C83m per radiology within normal age related change. No abnormality otherwise. LFTs were otherwise normal. DEXA done since the last visit which reportedly showed osteopenia.    Past Medical History:  Diagnosis Date  . Allergy   . Arthritis   . Basal cell carcinoma of forehead ~ 2008  . Chronic lower back pain   . Cirrhosis, primary biliary   . Diverticulosis of colon   . Dysrhythmia   . GERD (gastroesophageal reflux disease)   . Hard of hearing    bilat   . Heart murmur   . High cholesterol   . History of hiatal hernia   . Hx of colon cancer, stage I   . Hypertension   . Macular degeneration   . Multiple falls   . Osteoporosis    osteopenia   . Raynaud disease    hands   . Ulcerative colitis   . Venous insufficiency    lower legs     Past Surgical History:  Procedure Laterality Date  . APPENDECTOMY    . CATARACT EXTRACTION W/ INTRAOCULAR LENS  IMPLANT, BILATERAL Bilateral ~ 2008  . COLONOSCOPY    . KNEE ARTHROSCOPY Right 1990's  . KNEE ARTHROSCOPY Left 08/23/2015   Procedure: ARTHROSCOPY KNEE PARTIAL MEDIAL AND LATERAL MENISCECTOMY AND DEBRIDMENT;  Surgeon: JeffreSusa Day Location: [facility];  Service: Orthopedics;  Laterality: Left;  . LAPAROSCOPIC PARTIAL COLECTOMY N/A 09/15/2012   Procedure: LAPAROSCOPIC ILEOCOLECTOMY;  Surgeon: Faera Stark Klein Location: [facility];[place]: General;  Laterality: N/A;  . LAPAROSCOPIC RIGHT COLON RESECTION  09/15/2012  . MOUTH SURGERY    . POLYPECTOMY    . TONSILLECTOMY  1952  . TUBAL LIGATION  1973   Family History  Problem Relation Age of Onset  . Heart disease Mother   . Heart disease Brother   . Stroke Father   . Lung cancer Brother   . Breast cancer Paternal Aunt   . Ovarian cancer Paternal Grandmother   . Colon cancer Neg Hx   . Esophageal cancer Neg Hx   . Stomach cancer Neg Hx   . Pancreatic cancer Neg Hx    Social History  Substance Use Topics  . Smoking status: Former Smoker    Packs/day: 0.50    Years: 40.00    Types: Cigarettes    Quit date: 01/16/2012  . Smokeless tobacco: Never Used  . Alcohol use 6.0 oz/week    10 Glasses of wine per week   Current Outpatient Prescriptions  Medication Sig Dispense Refill  . aspirin 81 MG tablet Take 81 mg by mouth at bedtime.     . cetirizine (ZYRTEC) 10 MG tablet Take 10 mg by mouth as needed for allergies.    Marland Kitchen ezetimibe (ZETIA) 10 MG tablet Take 5 mg by mouth 2 (two) times a week. Wednesday and Sunday.    . irbesartan (AVAPRO) 150 MG tablet Take 150 mg by mouth daily.    Marland Kitchen LIALDA 1.2 g EC tablet TAKE TWO TABLETS EVERY DAY WITH BREAKFAST 180 tablet 1  . Multiple Vitamins-Minerals (PRESERVISION AREDS PO) Take 1  tablet by mouth 2 (two) times daily.    Marland Kitchen omeprazole (PRILOSEC) 20 MG capsule TAKE ONE CAPSULE EACH DAY 30 capsule 6  . Pitavastatin Calcium (LIVALO) 4 MG TABS Take 0.5 tablets by mouth 2 (two) times a week. Wednesday and Sunday    . ranitidine (ZANTAC) 150 MG tablet Take 1 tablet (150 mg total) by mouth 2 (two) times daily. 90 tablet 3  . ursodiol (ACTIGALL) 300 MG capsule TAKE 2 CAPSULES DAILY ALTERNATING WITH 3CAPSULES DAILY 270 capsule 3  . fluticasone (FLONASE) 50 MCG/ACT nasal spray   0   Current Facility-Administered Medications  Medication Dose Route Frequency Provider Last Rate Last Dose  . 0.9 %  sodium chloride infusion  500 mL Intravenous Continuous Manus Gunning, MD       Allergies  Allergen Reactions  . Codeine Nausea And Vomiting  . Penicillins Hives     Review of Systems: All systems reviewed and negative except where noted in HPI.   Lab Results  Component Value Date   WBC 5.4 08/05/2016   HGB 14.4 08/05/2016   HCT 43.3 08/05/2016   MCV 94.3 08/05/2016   PLT 198.0 08/05/2016    Lab Results  Component Value Date   CREATININE 0.70 08/05/2016   BUN 14 08/05/2016   NA 139 08/05/2016   K 4.6 08/05/2016   CL 106 08/05/2016   CO2 29 08/05/2016    Lab Results  Component Value Date   ALT 13 08/05/2016   AST 17 08/05/2016   ALKPHOS 65 08/05/2016   BILITOT 0.6 08/05/2016    Lab Results  Component Value Date   INR 1.01 08/10/2015   INR 0.92 09/13/2012   INR 1.1 (H) 07/14/2012     Physical Exam: BP 120/80   Pulse 70   Resp 16   Ht 5' 2"  (1.575 m)   Wt 135 lb (61.2 kg)   BMI 24.69 kg/m  Constitutional: Pleasant,well-developed, female in no acute distress. HEENT: Normocephalic and atraumatic. Conjunctivae are normal. No scleral icterus. Neck supple.  Cardiovascular: Normal rate, regular rhythm.  Pulmonary/chest: Effort normal and breath sounds normal. No wheezing, rales or rhonchi. Abdominal: Soft, nondistended, nontender. There are no  masses palpable. No hepatomegaly. Extremities: no edema Lymphadenopathy: No cervical adenopathy noted. Neurological: Alert and oriented to person place and time. Skin: Skin is warm and dry. No rashes noted. Psychiatric: Normal mood and affect. Behavior is normal.   ASSESSMENT AND PLAN: 82 year old female here for reassessment following issues:  Left-sided ulcerative colitis - doing well in this regard in general although having some bowel  irregularity. No anemia on labs, her last colonoscopy showed she was in remission in 2016. Recommend a daily fiber supplement to see if we can normalize her bowel habits and continue Lialda at this time. Given her history of colon cancer and colitis, she is at higher than average risk for recurrent colon cancer. Guidelines would recommend colonoscopy every one to 2 years when she is 15 years out from her diagnosis, or 3 years from her last exam given her colon cancer. She is anxious about waiting that long for a colonoscopy and wanted to perform her surveillance exam 1 year or sooner due to both these diagnosis and her change of bowel habits. I discussed risks and benefits of colonoscopy with her. She strongly wanted to proceed at this time, and I offered this to her for peace of mind. Further recommendations pending the result.  History of colon cancer - as above plan for colonoscopy per patient request for surveillance.  PBC - diagnosed with liver biopsy historically. AP and ALT have been normal. Continue Ursodiol dosing at 13-42m/kg. She is due for TSH and vitamin D and will order. DEXA scan UTD..Marland KitchenMRCP as above, only age related changes. Would recheck LFTs in 6 months.   SCarolina Cellar MD [facility] Pager [phone]

## 2016-08-05 NOTE — Patient Instructions (Addendum)
If you are age 80 or older, your body mass index should be between 23-30. Your Body mass index is 24.69 kg/m. If this is out of the aforementioned range listed, please consider follow up with your Primary Care Provider.  If you are age 77 or younger, your body mass index should be between 19-25. Your Body mass index is 24.69 kg/m. If this is out of the aformentioned range listed, please consider follow up with your Primary Care Provider.   We have sent the following medications to your pharmacy for you to pick up at your convenience:  [facility] has requested that you go to the basement for the following lab work before leaving today:  CBC, CMET, Vit D, TSH  Please purchase a daily fiber supplement. Citrucel  You have been scheduled for a colonoscopy. Please follow written instructions given to you at your visit today.  Please pick up your prep supplies at the pharmacy within the next 1-3 days. If you use inhalers (even only as needed), please bring them with you on the day of your procedure. Your physician has requested that you go to www.startemmi.com and enter the access code given to you at your visit today. This web site gives a general overview about your procedure. However, you should still follow specific instructions given to you by our office regarding your preparation for the procedure.  Thank you.

## 2016-08-06 ENCOUNTER — Encounter: Payer: Self-pay | Admitting: Gastroenterology

## 2016-08-14 DIAGNOSIS — H5203 Hypermetropia, bilateral: Secondary | ICD-10-CM | POA: Diagnosis not present

## 2016-08-25 ENCOUNTER — Telehealth: Payer: Self-pay

## 2016-08-25 ENCOUNTER — Other Ambulatory Visit: Payer: Self-pay

## 2016-08-25 MED ORDER — MESALAMINE 4 G RE ENEM: 4.0000 g | ENEMA | Freq: Every day | RECTAL | 0 refills | Status: DC

## 2016-08-25 NOTE — Telephone Encounter (Signed)
She can try increasing Lialda to 4 tabs per day for a few weeks, and we can give her Rowasa enemas to use as well if she doesn't have those. She can use one enema q HS for a week and see if this helps. If not she should call us back. thanks

## 2016-08-25 NOTE — Telephone Encounter (Signed)
Patient states that since her last ov on 4/3, she has had abnormal stools. First they were light colored, then bloody diarrhea, with mucus which started 1 week ago. She states it is a little bit better than what it was a week ago, but still having this issue. Lower abdominal pain, denies fever, has not been on antibiotics. Colonoscopy is scheduled for June, 2018.

## 2016-08-25 NOTE — Telephone Encounter (Signed)
Patient advised to increase her Lialda to 4 tabs a day for a few weeks and have sent a new prescription for Rowasa enemas to her pharmacy to try at bedtime for one week. Patient advised to call back if these changes are not helping. Patient will be going on a trip in May and wants to take care of this prior to the trip.

## 2016-10-06 DIAGNOSIS — L918 Other hypertrophic disorders of the skin: Secondary | ICD-10-CM | POA: Diagnosis not present

## 2016-10-06 DIAGNOSIS — Z85828 Personal history of other malignant neoplasm of skin: Secondary | ICD-10-CM | POA: Diagnosis not present

## 2016-10-06 DIAGNOSIS — L720 Epidermal cyst: Secondary | ICD-10-CM | POA: Diagnosis not present

## 2016-10-06 DIAGNOSIS — D485 Neoplasm of uncertain behavior of skin: Secondary | ICD-10-CM | POA: Diagnosis not present

## 2016-10-08 ENCOUNTER — Encounter: Payer: Medicare Other | Admitting: Gastroenterology

## 2016-10-10 ENCOUNTER — Ambulatory Visit (AMBULATORY_SURGERY_CENTER): Payer: Medicare Other | Admitting: Gastroenterology

## 2016-10-10 ENCOUNTER — Encounter: Payer: Self-pay | Admitting: Gastroenterology

## 2016-10-10 VITALS — BP 130/85 | HR 68 | Temp 99.3°F | Resp 13 | Ht 62.0 in | Wt 135.0 lb

## 2016-10-10 DIAGNOSIS — Z85038 Personal history of other malignant neoplasm of large intestine: Secondary | ICD-10-CM | POA: Diagnosis not present

## 2016-10-10 DIAGNOSIS — K449 Diaphragmatic hernia without obstruction or gangrene: Secondary | ICD-10-CM | POA: Diagnosis not present

## 2016-10-10 DIAGNOSIS — K51319 Ulcerative (chronic) rectosigmoiditis with unspecified complications: Secondary | ICD-10-CM | POA: Diagnosis not present

## 2016-10-10 DIAGNOSIS — I10 Essential (primary) hypertension: Secondary | ICD-10-CM | POA: Diagnosis not present

## 2016-10-10 DIAGNOSIS — D124 Benign neoplasm of descending colon: Secondary | ICD-10-CM | POA: Diagnosis not present

## 2016-10-10 DIAGNOSIS — K5150 Left sided colitis without complications: Secondary | ICD-10-CM | POA: Diagnosis not present

## 2016-10-10 DIAGNOSIS — K5190 Ulcerative colitis, unspecified, without complications: Secondary | ICD-10-CM | POA: Diagnosis not present

## 2016-10-10 DIAGNOSIS — K219 Gastro-esophageal reflux disease without esophagitis: Secondary | ICD-10-CM | POA: Diagnosis not present

## 2016-10-10 MED ORDER — SODIUM CHLORIDE 0.9 % IV SOLN: 500.0000 mL | INTRAVENOUS | Status: DC

## 2016-10-10 NOTE — Progress Notes (Signed)
Report to PACU, RN, vss, BBS= Clear.  

## 2016-10-10 NOTE — Op Note (Signed)
[facility] Patient Name: Heidi [name] Procedure Date: 10/10/2016 12:49 PM MRN: [id number] Endoscopist: Remo Lipps P. Armbruster MD, MD Age: 82 Referring MD:  Date of Birth: [birth year] Gender: Female Account #: [id number] Procedure:                Colonoscopy Indications:              High risk colon cancer surveillance: Personal                            history of colon cancer, High risk colon cancer                            surveillance: Ulcerative left sided colitis,                            symptoms well controlled on Lialda Medicines:                Monitored Anesthesia Care Procedure:                Pre-Anesthesia Assessment:                           - Prior to the procedure, a History and Physical                            was performed, and patient medications and                            allergies were reviewed. The patient's tolerance of                            previous anesthesia was also reviewed. The risks                            and benefits of the procedure and the sedation                            options and risks were discussed with the patient.                            All questions were answered, and informed consent                            was obtained. Prior Anticoagulants: The patient has                            taken no previous anticoagulant or antiplatelet                            agents. ASA Grade Assessment: II - A patient with                            mild systemic disease. After reviewing the risks  and benefits, the patient was deemed in                            satisfactory condition to undergo the procedure.                           After obtaining informed consent, the colonoscope                            was passed under direct vision. Throughout the                            procedure, the patient's blood pressure, pulse, and                            oxygen saturations were  monitored continuously. The                            Colonoscope was introduced through the anus and                            advanced to the the ileocolonic anastomosis. The                            colonoscopy was performed without difficulty. The                            patient tolerated the procedure well. The quality                            of the bowel preparation was good. The terminal                            ileum and the rectum were photographed. Scope In: 12:59:38 PM Scope Out: 1:21:59 PM Scope Withdrawal Time: 0 hours 18 minutes 10 seconds  Total Procedure Duration: 0 hours 22 minutes 21 seconds  Findings:                 The perianal and digital rectal examinations were                            normal.                           There was evidence of a prior end-to-end                            colo-colonic anastomosis in the ascending colon.                            This was patent and was characterized by healthy                            appearing mucosa.  The terminal ileum appeared normal.                           Many medium-mouthed diverticula were found in the                            left colon.                           A 10 mm polyp was found in the descending colon.                            The polyp was sessile. The polyp was removed with a                            cold snare. Resection and retrieval were complete.                           The exam was otherwise without abnormality.                           Biopsies were taken with a cold forceps from the                            left colon for ulcerative colitis surveillance.                            These biopsy specimens were sent to Pathology. Complications:            No immediate complications. Estimated blood loss:                            Minimal. Estimated Blood Loss:     Estimated blood loss was minimal. Impression:               - Patent  end-to-end colo-colonic anastomosis,                            characterized by healthy appearing mucosa.                           - The examined portion of the ileum was normal.                           - Diverticulosis in the left colon.                           - One 10 mm polyp in the descending colon, removed                            with a cold snare. Resected and retrieved.                           - The examination was otherwise normal.                           -  Biopsies for surveillance were taken from the                            left colon. Recommendation:           - Patient has a contact number available for                            emergencies. The signs and symptoms of potential                            delayed complications were discussed with the                            patient. Return to normal activities tomorrow.                            Written discharge instructions were provided to the                            patient.                           - Resume previous diet.                           - Continue present medications.                           - Await pathology results.                           - Repeat colonoscopy is recommended for                            surveillance. The colonoscopy date will be                            determined after pathology results from today's                            exam become available for review.                           - No ibuprofen, naproxen, or other non-steroidal                            anti-inflammatory drugs for 2 weeks after polyp                            removal. Remo Lipps P. Armbruster MD, MD 10/10/2016 1:27:47 PM This report has been signed electronically.

## 2016-10-10 NOTE — Progress Notes (Signed)
Called to room to assist during endoscopic procedure.  Patient ID and intended procedure confirmed with present staff. Received instructions for my participation in the procedure from the performing physician.  

## 2016-10-10 NOTE — Patient Instructions (Signed)
YOU HAD AN ENDOSCOPIC PROCEDURE TODAY AT [facility]:   Refer to the procedure report that was given to you for any specific questions about what was found during the examination.  If the procedure report does not answer your questions, please call your gastroenterologist to clarify.  If you requested that your care partner not be given the details of your procedure findings, then the procedure report has been included in a sealed envelope for you to review at your convenience later.  YOU SHOULD EXPECT: Some feelings of bloating in the abdomen. Passage of more gas than usual.  Walking can help get rid of the air that was put into your GI tract during the procedure and reduce the bloating. If you had a lower endoscopy (such as a colonoscopy or flexible sigmoidoscopy) you may notice spotting of blood in your stool or on the toilet paper. If you underwent a bowel prep for your procedure, you may not have a normal bowel movement for a few days.  Please Note:  You might notice some irritation and congestion in your nose or some drainage.  This is from the oxygen used during your procedure.  There is no need for concern and it should clear up in a day or so.  SYMPTOMS TO REPORT IMMEDIATELY:   Following lower endoscopy (colonoscopy or flexible sigmoidoscopy):  Excessive amounts of blood in the stool  Significant tenderness or worsening of abdominal pains  Swelling of the abdomen that is new, acute  Fever of 100F or higher   Following upper endoscopy (EGD)  Vomiting of blood or coffee ground material  New chest pain or pain under the shoulder blades  Painful or persistently difficult swallowing  New shortness of breath  Fever of 100F or higher  Black, tarry-looking stools  For urgent or emergent issues, a gastroenterologist can be reached at any hour by calling [phone].   DIET:  We do recommend a small meal at first, but then you may proceed to your regular diet.  Drink  plenty of fluids but you should avoid alcoholic beverages for 24 hours.  ACTIVITY:  You should plan to take it easy for the rest of today and you should NOT DRIVE or use heavy machinery until tomorrow (because of the sedation medicines used during the test).    FOLLOW UP: Our staff will call the number listed on your records the next business day following your procedure to check on you and address any questions or concerns that you may have regarding the information given to you following your procedure. If we do not reach you, we will leave a message.  However, if you are feeling well and you are not experiencing any problems, there is no need to return our call.  We will assume that you have returned to your regular daily activities without incident.  If any biopsies were taken you will be contacted by phone or by letter within the next 1-3 weeks.  Please call us at [phone] if you have not heard about the biopsies in 3 weeks.    SIGNATURES/CONFIDENTIALITY: You and/or your care partner have signed paperwork which will be entered into your electronic medical record.  These signatures attest to the fact that that the information above on your After Visit Summary has been reviewed and is understood.  Full responsibility of the confidentiality of this discharge information lies with you and/or your care-partner.  Polyp information given. Diverticulosis information.   No ibuprofen, naproxen or  other non steroidal anti inflammatory meds for 2 weeks.

## 2016-10-13 ENCOUNTER — Telehealth: Payer: Self-pay | Admitting: *Deleted

## 2016-10-13 NOTE — Telephone Encounter (Signed)
  Follow up Call-  Call back number 10/10/2016 03/14/2016 10/27/2014  Post procedure Call Back phone  # [phone]  Permission to leave phone message Yes Yes Yes  Some recent data might be hidden     Patient questions:  Do you have a fever, pain , or abdominal swelling? No. Pain Score  0 *  Have you tolerated food without any problems? Yes.    Have you been able to return to your normal activities? Yes.    Do you have any questions about your discharge instructions: Diet   No. Medications  No. Follow up visit  No.  Do you have questions or concerns about your Care? No.  Actions: * If pain score is 4 or above: No action needed, pain <4.

## 2016-10-14 ENCOUNTER — Encounter: Payer: Self-pay | Admitting: Gastroenterology

## 2016-10-29 ENCOUNTER — Other Ambulatory Visit: Payer: Self-pay | Admitting: Internal Medicine

## 2017-01-06 ENCOUNTER — Telehealth: Payer: Self-pay | Admitting: Gastroenterology

## 2017-01-06 NOTE — Telephone Encounter (Signed)
Tier exceptions filled out for both ursodiol and mesalamine and faxed to [facility]. Patient advised.

## 2017-01-07 ENCOUNTER — Telehealth: Payer: Self-pay | Admitting: Gastroenterology

## 2017-01-07 NOTE — Telephone Encounter (Signed)
Received approval for tier exception request for meslamine and will end on 01/06/18.

## 2017-01-07 NOTE — Telephone Encounter (Signed)
I have contacted [employer] who explains that patient's mesalamine has been approved through 01/06/18. However, they have denied patient's ursodiol 300 mg tier exception because it is not being given for an "FDA approved indication." When asked to explain what the FDA finds as an approved indication, I was told that the medication is only approved for primary biliary cirrhosis. 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 This is interchangable with primary biliary cirrhosis. However, I was told that the decision could not be changed without an appeal even though this would have been an error on the part of insurance. Therefore, I have sent a letter of expedited appeal to [employer] at fax [phone] and am awaiting response. Turn around time I'm told is 72 hours.

## 2017-01-08 NOTE — Telephone Encounter (Signed)
[employer] called. Rx appeal for Ursodiol has been denied.

## 2017-02-17 DIAGNOSIS — Z1231 Encounter for screening mammogram for malignant neoplasm of breast: Secondary | ICD-10-CM | POA: Diagnosis not present

## 2017-02-24 DIAGNOSIS — Z23 Encounter for immunization: Secondary | ICD-10-CM | POA: Diagnosis not present

## 2017-03-31 ENCOUNTER — Ambulatory Visit (INDEPENDENT_AMBULATORY_CARE_PROVIDER_SITE_OTHER): Payer: Medicare Other | Admitting: Ophthalmology

## 2017-03-31 DIAGNOSIS — H43813 Vitreous degeneration, bilateral: Secondary | ICD-10-CM

## 2017-03-31 DIAGNOSIS — I10 Essential (primary) hypertension: Secondary | ICD-10-CM

## 2017-03-31 DIAGNOSIS — H35033 Hypertensive retinopathy, bilateral: Secondary | ICD-10-CM

## 2017-03-31 DIAGNOSIS — H353132 Nonexudative age-related macular degeneration, bilateral, intermediate dry stage: Secondary | ICD-10-CM | POA: Diagnosis not present

## 2017-06-02 DIAGNOSIS — I10 Essential (primary) hypertension: Secondary | ICD-10-CM | POA: Diagnosis not present

## 2017-06-02 DIAGNOSIS — R82998 Other abnormal findings in urine: Secondary | ICD-10-CM | POA: Diagnosis not present

## 2017-06-02 DIAGNOSIS — M859 Disorder of bone density and structure, unspecified: Secondary | ICD-10-CM | POA: Diagnosis not present

## 2017-06-02 DIAGNOSIS — E538 Deficiency of other specified B group vitamins: Secondary | ICD-10-CM | POA: Diagnosis not present

## 2017-06-02 DIAGNOSIS — E7849 Other hyperlipidemia: Secondary | ICD-10-CM | POA: Diagnosis not present

## 2017-06-09 DIAGNOSIS — Z0000 Encounter for general adult medical examination without abnormal findings: Secondary | ICD-10-CM | POA: Diagnosis not present

## 2017-06-09 DIAGNOSIS — M79605 Pain in left leg: Secondary | ICD-10-CM | POA: Diagnosis not present

## 2017-06-09 DIAGNOSIS — C189 Malignant neoplasm of colon, unspecified: Secondary | ICD-10-CM | POA: Diagnosis not present

## 2017-06-09 DIAGNOSIS — M25552 Pain in left hip: Secondary | ICD-10-CM | POA: Diagnosis not present

## 2017-06-09 DIAGNOSIS — M79604 Pain in right leg: Secondary | ICD-10-CM | POA: Diagnosis not present

## 2017-06-09 DIAGNOSIS — Z1389 Encounter for screening for other disorder: Secondary | ICD-10-CM | POA: Diagnosis not present

## 2017-06-09 DIAGNOSIS — Z6824 Body mass index (BMI) 24.0-24.9, adult: Secondary | ICD-10-CM | POA: Diagnosis not present

## 2017-06-09 DIAGNOSIS — H6983 Other specified disorders of Eustachian tube, bilateral: Secondary | ICD-10-CM | POA: Diagnosis not present

## 2017-06-09 DIAGNOSIS — M7122 Synovial cyst of popliteal space [Baker], left knee: Secondary | ICD-10-CM | POA: Diagnosis not present

## 2017-06-09 DIAGNOSIS — H9193 Unspecified hearing loss, bilateral: Secondary | ICD-10-CM | POA: Diagnosis not present

## 2017-06-09 DIAGNOSIS — I872 Venous insufficiency (chronic) (peripheral): Secondary | ICD-10-CM | POA: Diagnosis not present

## 2017-06-09 DIAGNOSIS — M5416 Radiculopathy, lumbar region: Secondary | ICD-10-CM | POA: Diagnosis not present

## 2017-06-24 ENCOUNTER — Other Ambulatory Visit: Payer: Self-pay | Admitting: Gastroenterology

## 2017-08-11 ENCOUNTER — Other Ambulatory Visit: Payer: Self-pay | Admitting: Gastroenterology

## 2017-08-11 ENCOUNTER — Other Ambulatory Visit (INDEPENDENT_AMBULATORY_CARE_PROVIDER_SITE_OTHER): Payer: Medicare Other

## 2017-08-11 DIAGNOSIS — K51319 Ulcerative (chronic) rectosigmoiditis with unspecified complications: Secondary | ICD-10-CM | POA: Diagnosis not present

## 2017-08-11 DIAGNOSIS — E559 Vitamin D deficiency, unspecified: Secondary | ICD-10-CM

## 2017-08-11 LAB — COMPREHENSIVE METABOLIC PANEL
ALBUMIN: 3.8 g/dL (ref 3.5–5.2)
ALK PHOS: 59 U/L (ref 39–117)
ALT: 10 U/L (ref 0–35)
AST: 16 U/L (ref 0–37)
BUN: 18 mg/dL (ref 6–23)
CO2: 28 mEq/L (ref 19–32)
Calcium: 9 mg/dL (ref 8.4–10.5)
Chloride: 105 mEq/L (ref 96–112)
Creatinine, Ser: 0.81 mg/dL (ref 0.40–1.20)
GFR: 72.13 mL/min (ref >60.00)
Glucose, Bld: 107 mg/dL — ABNORMAL HIGH (ref 70–99)
POTASSIUM: 3.9 meq/L (ref 3.5–5.1)
Sodium: 138 mEq/L (ref 135–145)
TOTAL PROTEIN: 6.6 g/dL (ref 6.0–8.3)
Total Bilirubin: 0.6 mg/dL (ref 0.2–1.2)

## 2017-08-11 LAB — VITAMIN D 25 HYDROXY (VIT D DEFICIENCY, FRACTURES): VITD: 38.72 ng/mL (ref 30.00–100.00)

## 2017-08-11 NOTE — Telephone Encounter (Signed)
OK to refill

## 2017-08-11 NOTE — Telephone Encounter (Signed)
Called and spoke to pt. She will go to the lab this week or next. Orders are entered.  She asked when her next colon is due.  She has a recall in place for 10-2017 so I scheduled her for a colonoscopy on 6-6 and scheduled her for a previsit 2 weeks prior. Pls advise if she does not need this due to age.

## 2017-08-11 NOTE — Telephone Encounter (Signed)
Thanks Jan, Yes okay to refill. She is overdue for basic labs. Can you order CMET and vitamin D for her and ask her to go to the lab? Thanks

## 2017-09-23 ENCOUNTER — Ambulatory Visit (AMBULATORY_SURGERY_CENTER): Payer: Self-pay | Admitting: *Deleted

## 2017-09-23 ENCOUNTER — Other Ambulatory Visit: Payer: Self-pay

## 2017-09-23 VITALS — Ht 62.0 in | Wt 130.0 lb

## 2017-09-23 DIAGNOSIS — K5130 Ulcerative (chronic) rectosigmoiditis without complications: Secondary | ICD-10-CM

## 2017-09-23 DIAGNOSIS — Z86010 Personal history of colonic polyps: Secondary | ICD-10-CM

## 2017-09-23 DIAGNOSIS — Z85038 Personal history of other malignant neoplasm of large intestine: Secondary | ICD-10-CM

## 2017-09-23 MED ORDER — NA SULFATE-K SULFATE-MG SULF 17.5-3.13-1.6 GM/177ML PO SOLN: ORAL | 0 refills | Status: DC

## 2017-09-23 NOTE — Progress Notes (Signed)
Patient denies any allergies to eggs or soy. Patient denies any problems with anesthesia/sedation. Patient denies any oxygen use at home. Patient denies taking any diet/weight loss medications or blood thinners. EMMI education declined by the pt.

## 2017-10-08 ENCOUNTER — Ambulatory Visit (AMBULATORY_SURGERY_CENTER): Payer: Medicare Other | Admitting: Gastroenterology

## 2017-10-08 ENCOUNTER — Encounter: Payer: Self-pay | Admitting: Gastroenterology

## 2017-10-08 ENCOUNTER — Other Ambulatory Visit: Payer: Self-pay

## 2017-10-08 VITALS — BP 132/87 | HR 58 | Temp 96.9°F | Resp 9 | Ht 62.0 in | Wt 130.0 lb

## 2017-10-08 DIAGNOSIS — D124 Benign neoplasm of descending colon: Secondary | ICD-10-CM

## 2017-10-08 DIAGNOSIS — Z85038 Personal history of other malignant neoplasm of large intestine: Secondary | ICD-10-CM

## 2017-10-08 DIAGNOSIS — K5150 Left sided colitis without complications: Secondary | ICD-10-CM

## 2017-10-08 DIAGNOSIS — D122 Benign neoplasm of ascending colon: Secondary | ICD-10-CM | POA: Diagnosis not present

## 2017-10-08 DIAGNOSIS — Z86010 Personal history of colonic polyps: Secondary | ICD-10-CM | POA: Diagnosis not present

## 2017-10-08 DIAGNOSIS — D125 Benign neoplasm of sigmoid colon: Secondary | ICD-10-CM | POA: Diagnosis not present

## 2017-10-08 DIAGNOSIS — I10 Essential (primary) hypertension: Secondary | ICD-10-CM | POA: Diagnosis not present

## 2017-10-08 MED ORDER — SODIUM CHLORIDE 0.9 % IV SOLN: 500.0000 mL | Freq: Once | INTRAVENOUS | Status: DC

## 2017-10-08 NOTE — Patient Instructions (Signed)
*   handouts on polyps, hemorrhoids and diverticulosis *  YOU HAD AN ENDOSCOPIC PROCEDURE TODAY AT [facility]:   Refer to the procedure report that was given to you for any specific questions about what was found during the examination.  If the procedure report does not answer your questions, please call your gastroenterologist to clarify.  If you requested that your care partner not be given the details of your procedure findings, then the procedure report has been included in a sealed envelope for you to review at your convenience later.  YOU SHOULD EXPECT: Some feelings of bloating in the abdomen. Passage of more gas than usual.  Walking can help get rid of the air that was put into your GI tract during the procedure and reduce the bloating. If you had a lower endoscopy (such as a colonoscopy or flexible sigmoidoscopy) you may notice spotting of blood in your stool or on the toilet paper. If you underwent a bowel prep for your procedure, you may not have a normal bowel movement for a few days.  Please Note:  You might notice some irritation and congestion in your nose or some drainage.  This is from the oxygen used during your procedure.  There is no need for concern and it should clear up in a day or so.  SYMPTOMS TO REPORT IMMEDIATELY:   Following lower endoscopy (colonoscopy or flexible sigmoidoscopy):  Excessive amounts of blood in the stool  Significant tenderness or worsening of abdominal pains  Swelling of the abdomen that is new, acute  Fever of 100F or higher   For urgent or emergent issues, a gastroenterologist can be reached at any hour by calling [phone].   DIET:  We do recommend a small meal at first, but then you may proceed to your regular diet.  Drink plenty of fluids but you should avoid alcoholic beverages for 24 hours.  ACTIVITY:  You should plan to take it easy for the rest of today and you should NOT DRIVE or use heavy machinery until tomorrow  (because of the sedation medicines used during the test).    FOLLOW UP: Our staff will call the number listed on your records the next business day following your procedure to check on you and address any questions or concerns that you may have regarding the information given to you following your procedure. If we do not reach you, we will leave a message.  However, if you are feeling well and you are not experiencing any problems, there is no need to return our call.  We will assume that you have returned to your regular daily activities without incident.  If any biopsies were taken you will be contacted by phone or by letter within the next 1-3 weeks.  Please call us at [phone] if you have not heard about the biopsies in 3 weeks.    SIGNATURES/CONFIDENTIALITY: You and/or your care partner have signed paperwork which will be entered into your electronic medical record.  These signatures attest to the fact that that the information above on your After Visit Summary has been reviewed and is understood.  Full responsibility of the confidentiality of this discharge information lies with you and/or your care-partner.

## 2017-10-08 NOTE — Op Note (Signed)
[facility] Patient Name: Heidi [name] Procedure Date: 10/08/2017 1:29 PM MRN: [id number] Endoscopist: Heidi [name] , Heidi [name] Age: 83 Referring Heidi [name]:  Date of Birth: 02/28/1935 Gender: Female Account #: [id number] Procedure:                Colonoscopy Indications:              High risk colon cancer surveillance: Personal                            history of colon cancer, High risk colon cancer                            surveillance: Ulcerative left sided colitis, on                            Lialda with good control of symptoms Medicines:                Monitored Anesthesia Care Procedure:                Pre-Anesthesia Assessment:                           - Prior to the procedure, a History and Physical                            was performed, and patient medications and                            allergies were reviewed. The patient's tolerance of                            previous anesthesia was also reviewed. The risks                            and benefits of the procedure and the sedation                            options and risks were discussed with the patient.                            All questions were answered, and informed consent                            was obtained. Prior Anticoagulants: The patient has                            taken no previous anticoagulant or antiplatelet                            agents. ASA Grade Assessment: III - A patient with                            severe systemic disease. After reviewing the risks  and benefits, the patient was deemed in                            satisfactory condition to undergo the procedure.                           After obtaining informed consent, the colonoscope                            was passed under direct vision. Throughout the                            procedure, the patient's blood pressure, pulse, and                            oxygen saturations were  monitored continuously. The                            Model PCF-H190DL [id number]) scope was introduced                            through the anus and advanced to the the                            ileocolonic anastomosis. The colonoscopy was                            performed without difficulty. The patient tolerated                            the procedure well. The quality of the bowel                            preparation was adequate. The terminal ileum and                            the rectum, surgical anastomosis were photographed. Scope In: 1:34:39 PM Scope Out: 1:55:33 PM Scope Withdrawal Time: 0 hours 16 minutes 27 seconds  Total Procedure Duration: 0 hours 20 minutes 54 seconds  Findings:                 The perianal and digital rectal examinations were                            normal.                           There was evidence of a prior end-to-side                            ileo-colonic anastomosis in the ascending colon.                            This was patent and was characterized by healthy  appearing mucosa.                           The terminal ileum appeared normal.                           Two flat polyps were found in the ascending colon.                            The polyps were 3 to 4 mm in size. These polyps                            were removed with a cold snare. Resection and                            retrieval were complete.                           Two flat polyps were found in the descending colon.                            The polyps were 3 to 4 mm in size. These polyps                            were removed with a cold snare. Resection and                            retrieval were complete.                           A 3 mm polyp was found in the sigmoid colon. The                            polyp was sessile. The polyp was removed with a                            cold snare. Resection and retrieval were  complete.                           Multiple medium-mouthed diverticula were found in                            the left colon.                           Internal hemorrhoids were found during retroflexion.                           The exam was otherwise without abnormality.                           Biopsies were taken with a cold forceps from the  left colon. These biopsy specimens were sent to                            Pathology. Complications:            No immediate complications. Estimated blood loss:                            Minimal. Estimated Blood Loss:     Estimated blood loss was minimal. Impression:               - Patent end-to-side ileo-colonic anastomosis,                            characterized by healthy appearing mucosa.                           - The examined portion of the ileum was normal.                           - Two 3 to 4 mm polyps in the ascending colon,                            removed with a cold snare. Resected and retrieved.                           - Two 3 to 4 mm polyps in the descending colon,                            removed with a cold snare. Resected and retrieved.                           - One 3 mm polyp in the sigmoid colon, removed with                            a cold snare. Resected and retrieved.                           - Diverticulosis in the left colon.                           - Internal hemorrhoids.                           - The examination was otherwise normal. No                            inflammatory changes noted                           - Biopsies for surveillance were taken from the                            left colon. Recommendation:           - Patient has a contact number available  for                            emergencies. The signs and symptoms of potential                            delayed complications were discussed with the                            patient. Return to  normal activities tomorrow.                            Written discharge instructions were provided to the                            patient.                           - Resume previous diet.                           - Continue present medications.                           - Await pathology results.                           - Repeat colonoscopy for surveillance based on                            pathology results. Heidi Lipps P. Lansing Sigmon, Heidi [name] 10/08/2017 2:01:43 PM This report has been signed electronically.

## 2017-10-08 NOTE — Progress Notes (Signed)
Called to room to assist during endoscopic procedure.  Patient ID and intended procedure confirmed with present staff. Received instructions for my participation in the procedure from the performing physician.  

## 2017-10-08 NOTE — Progress Notes (Signed)
Report given to PACU, vss 

## 2017-10-09 ENCOUNTER — Telehealth: Payer: Self-pay | Admitting: *Deleted

## 2017-10-09 NOTE — Telephone Encounter (Signed)
  Follow up Call-  Call back number 10/08/2017 10/10/2016 03/14/2016  Post procedure Call Back phone  # [phone]  Permission to leave phone message Yes Yes Yes  Some recent data might be hidden     Patient questions:  Do you have a fever, pain , or abdominal swelling? No. Pain Score  0 *  Have you tolerated food without any problems? Yes.    Have you been able to return to your normal activities? Yes.    Do you have any questions about your discharge instructions: Diet   No. Medications  No. Follow up visit  No.  Do you have questions or concerns about your Care? No.  Actions: * If pain score is 4 or above: No action needed, pain <4.

## 2017-10-19 ENCOUNTER — Encounter: Payer: Self-pay | Admitting: Gastroenterology

## 2017-11-16 DIAGNOSIS — M549 Dorsalgia, unspecified: Secondary | ICD-10-CM | POA: Insufficient documentation

## 2017-11-16 DIAGNOSIS — M5417 Radiculopathy, lumbosacral region: Secondary | ICD-10-CM | POA: Diagnosis not present

## 2017-11-16 DIAGNOSIS — M545 Low back pain: Secondary | ICD-10-CM | POA: Diagnosis not present

## 2018-01-09 DIAGNOSIS — Z23 Encounter for immunization: Secondary | ICD-10-CM | POA: Diagnosis not present

## 2018-01-13 ENCOUNTER — Ambulatory Visit (INDEPENDENT_AMBULATORY_CARE_PROVIDER_SITE_OTHER): Payer: Medicare Other | Admitting: Gastroenterology

## 2018-01-13 ENCOUNTER — Encounter: Payer: Self-pay | Admitting: Gastroenterology

## 2018-01-13 ENCOUNTER — Telehealth: Payer: Self-pay | Admitting: Gastroenterology

## 2018-01-13 VITALS — BP 136/80 | HR 68 | Ht 61.25 in | Wt 133.2 lb

## 2018-01-13 DIAGNOSIS — Z85038 Personal history of other malignant neoplasm of large intestine: Secondary | ICD-10-CM

## 2018-01-13 DIAGNOSIS — K5130 Ulcerative (chronic) rectosigmoiditis without complications: Secondary | ICD-10-CM

## 2018-01-13 DIAGNOSIS — K743 Primary biliary cirrhosis: Secondary | ICD-10-CM

## 2018-01-13 NOTE — Telephone Encounter (Signed)
Telephone note opened in error

## 2018-01-13 NOTE — Progress Notes (Signed)
HPI :  83 y/o female here for a follow up visit to discuss issues as outlined:  UC - left sided colitis. Dx 14 years ago. Treated with mesalamine monotherapy over the years. She states she is doing quite well with this since I have last seen her. No bowel troubles, no blood in the stools. No abdominal pains. She has not had any flares of symptoms. She does states that she has a hard time being compliant with meds every day, often forgets to take her AM meds. She may miss a dose of her medication once or twice a week at baseline. Her last colonoscopy in 2019 showed fairly good control of her colitis, minimally active colitis on biopsies with no active inflammation appreciated. She had 4 small polyps removed - 3 adenomas, 1 HP. She also has a history of colon cancer of the cecum dx in 2014, had resection for it. She is followed by Oncology. She is anxious about stopping surveillance in light of her age.  PBC - diagnosed via liver biopsy around 2008. On Ursodiol with adjusted dosed for weight. US done in January 2017 showing dilated CBD to 8.71m. This led to subsequent MRCP which showed 864mCBD, per radiology within normal age related change. No abnormality otherwise. LFTs were otherwise normal. History of osteopenia followed by her primary care, vitamin D normal.  Colonoscopy 10/10/2016 - healthy colo-colonic anastomosis in the ascending colon. terminal ileum appeared normal. Many medium-mouthed diverticula were found in the left colon. A 10 mm polyp was found in the descending colon. The exam was otherwise without abnormality. Biopsies were taken with a cold forceps from the left colon for ulcerative colitis surveillance.  Colonoscopy 10/08/2017 - healthy ileo-colonic anastomosis in the ascending colon. the terminal ileum appeared normal. - Two flat polyps were found in the ascending colon. The polyps were 3 to 4 mm in size. Two flat polyps were found in the descending colon. The polyps were 3 to 4 mm in  size. A 3 mm polyp was found in the sigmoid colon. The polyp was sessile. Multiple medium-mouthed diverticula were found in the left colon. Internal hemorrhoids were found during retroflexion. Minimally active colitis   Past Medical History:  Diagnosis Date  . Allergy   . Arthritis   . Basal cell carcinoma of forehead ~ 2008  . Cataract   . Chronic lower back pain   . Cirrhosis, primary biliary   . Diverticulosis of colon   . Dysrhythmia   . GERD (gastroesophageal reflux disease)   . Hard of hearing    bilat   . Heart murmur   . High cholesterol   . History of hiatal hernia   . Hx of colon cancer, stage I   . Hypertension   . Macular degeneration   . Multiple falls   . Osteoporosis    osteopenia  . Raynaud disease    hands   . Ulcerative colitis   . Venous insufficiency    lower legs     Past Surgical History:  Procedure Laterality Date  . APPENDECTOMY    . CATARACT EXTRACTION W/ INTRAOCULAR LENS  IMPLANT, BILATERAL Bilateral ~ 2008  . COLONOSCOPY    . KNEE ARTHROSCOPY Right 1990's  . KNEE ARTHROSCOPY Left 08/23/2015   Procedure: ARTHROSCOPY KNEE PARTIAL MEDIAL AND LATERAL MENISCECTOMY AND DEBRIDMENT;  Surgeon: JeSusa DayMD;  Location: [facility];  Service: Orthopedics;  Laterality: Left;  . LAPAROSCOPIC PARTIAL COLECTOMY N/A 09/15/2012   Procedure: LAPAROSCOPIC ILEOCOLECTOMY;  Surgeon:  Stark Klein, MD;  Location: [facility];  Service: General;  Laterality: N/A;  . LAPAROSCOPIC RIGHT COLON RESECTION  09/15/2012  . MOUTH SURGERY    . POLYPECTOMY    . TONSILLECTOMY  1952  . TUBAL LIGATION  1973   Family History  Problem Relation Age of Onset  . Heart disease Mother   . Heart disease Brother   . Stroke Father   . Lung cancer Brother   . Breast cancer Paternal Aunt   . Ovarian cancer Paternal Grandmother   . Colon cancer Neg Hx   . Esophageal cancer Neg Hx   . Stomach cancer Neg Hx   . Pancreatic cancer Neg Hx   . Rectal cancer Neg Hx    Social History   Tobacco  Use  . Smoking status: Former Smoker    Packs/day: 0.50    Years: 40.00    Pack years: 20.00    Types: Cigarettes    Last attempt to quit: 01/16/2012    Years since quitting: 5.9  . Smokeless tobacco: Never Used  Substance Use Topics  . Alcohol use: Yes    Alcohol/week: 10.0 standard drinks    Types: 10 Glasses of wine per week  . Drug use: No   Current Outpatient Medications  Medication Sig Dispense Refill  . cetirizine (ZYRTEC) 10 MG tablet Take 10 mg by mouth daily.    . Cholecalciferol (VITAMIN D PO) Take 1 tablet by mouth daily.    . Cyanocobalamin (VITAMIN B 12 PO) Take 500 mg by mouth daily.    Marland Kitchen ezetimibe (ZETIA) 10 MG tablet Take 5 mg by mouth 2 (two) times a week. Wednesday and Sunday.    . irbesartan (AVAPRO) 150 MG tablet Take 150 mg by mouth daily.    . mesalamine (LIALDA) 1.2 g EC tablet TAKE TWO TABLETS EVERY DAY WITH BREAKFAST 180 tablet 1  . Multiple Vitamins-Minerals (PRESERVISION AREDS PO) Take 1 tablet by mouth 2 (two) times daily.    Marland Kitchen omeprazole (PRILOSEC) 20 MG capsule TAKE ONE CAPSULE EACH DAY 30 capsule 6  . Pitavastatin Calcium (LIVALO) 4 MG TABS Take 0.5 tablets by mouth 2 (two) times a week. Wednesday and Sunday    . ursodiol (ACTIGALL) 300 MG capsule TAKE TWO CAPSULES EVERY DAY ALTERNATING WITH 3 CAPSULES DAILY 270 capsule 3   Current Facility-Administered Medications  Medication Dose Route Frequency Provider Last Rate Last Dose  . 0.9 %  sodium chloride infusion  500 mL Intravenous Once Ellen Goris, Carlota Raspberry, MD       Allergies  Allergen Reactions  . Codeine Nausea And Vomiting  . Penicillins Hives     Review of Systems: All systems reviewed and negative except where noted in HPI.   Lab Results  Component Value Date   ALT 10 08/11/2017   AST 16 08/11/2017   ALKPHOS 59 08/11/2017   BILITOT 0.6 08/11/2017    Lab Results  Component Value Date   CREATININE 0.81 08/11/2017   BUN 18 08/11/2017   NA 138 08/11/2017   K 3.9 08/11/2017   CL  105 08/11/2017   CO2 28 08/11/2017    Lab Results  Component Value Date   WBC 5.4 08/05/2016   HGB 14.4 08/05/2016   HCT 43.3 08/05/2016   MCV 94.3 08/05/2016   PLT 198.0 08/05/2016    Lab Results  Component Value Date   INR 1.01 08/10/2015   INR 0.92 09/13/2012   INR 1.1 (H) 07/14/2012     Physical Exam:  BP 136/80 (BP Location: Left Arm, Patient Position: Sitting, Cuff Size: Normal)   Pulse 68   Ht 5' 1.25" (1.556 m) Comment: height measured without shoes  Wt 133 lb 4 oz (60.4 kg)   BMI 24.97 kg/m  Constitutional: Pleasant,well-developed, female in no acute distress. HEENT: Normocephalic and atraumatic. Conjunctivae are normal. No scleral icterus. Neck supple.  Cardiovascular: Normal rate, regular rhythm.  Pulmonary/chest: Effort normal and breath sounds normal. No wheezing, rales or rhonchi. Abdominal: Soft, nondistended, nontender.  There are no masses palpable. No hepatomegaly. Extremities: no edema Lymphadenopathy: No cervical adenopathy noted. Neurological: Alert and oriented to person place and time. Skin: Skin is warm and dry. No rashes noted. Psychiatric: Normal mood and affect. Behavior is normal.   ASSESSMENT AND PLAN: 84 year old female here for reassessment following issues:  Ulcerative colitis / history of colon cancer - maintained on mesalamine which has worked well for her over time, however she has a hard time being compliant and taking it every day. We discussed options for her to minimize her chance for forgetting this, she will try setting alarm on her cell phone every day to remind her to take it. Otherwise she's had a history of colon cancer and has had multiple adenomas on her last a few colonoscopies. We discussed how long she wants to have surveillance in light of her age. She is quite anxious about her risk for recurrent colon cancer, as long as she feels well and can tolerate it, she wishes to have colonoscopy every 12-18 months. I will reassess  her next year in regards to if she still wishes to have surveillance. Otherwise continue her medications for now, call in the interim with any changes in her bowels.  PBC - diagnosed with liver biopsy historically. AP and ALT have been normal. No evidence of cirrhosis. Continue Ursodiol dosing at 13-20m/kg. She is due for TSH and repeat LFTS next month. DEXA scan and due next year. She will follow up once yearly for this issue.   SCarolina Cellar MD [facility]

## 2018-01-13 NOTE — Patient Instructions (Signed)
Normal BMI (Body Mass Index- based on height and weight) is between 23 and 30. Your BMI today is Body mass index is 24.97 kg/m. Marland Kitchen Please consider follow up  regarding your BMI with your Primary Care Provider.  Your provider has requested  lab work in October We will contact you to come to our building. Press "B" on the elevator. The lab is located at the first door on the left as you exit the elevator.  Thank you for entrusting me with your care and choosing [facility].  Dr Havery Moros

## 2018-01-19 DIAGNOSIS — M2032 Hallux varus (acquired), left foot: Secondary | ICD-10-CM | POA: Diagnosis not present

## 2018-01-19 DIAGNOSIS — N390 Urinary tract infection, site not specified: Secondary | ICD-10-CM | POA: Diagnosis not present

## 2018-01-19 DIAGNOSIS — J309 Allergic rhinitis, unspecified: Secondary | ICD-10-CM | POA: Diagnosis not present

## 2018-01-19 DIAGNOSIS — B373 Candidiasis of vulva and vagina: Secondary | ICD-10-CM | POA: Diagnosis not present

## 2018-01-19 DIAGNOSIS — R05 Cough: Secondary | ICD-10-CM | POA: Diagnosis not present

## 2018-01-25 DIAGNOSIS — M7061 Trochanteric bursitis, right hip: Secondary | ICD-10-CM | POA: Diagnosis not present

## 2018-01-25 DIAGNOSIS — M1711 Unilateral primary osteoarthritis, right knee: Secondary | ICD-10-CM | POA: Diagnosis not present

## 2018-01-25 DIAGNOSIS — M25561 Pain in right knee: Secondary | ICD-10-CM | POA: Diagnosis not present

## 2018-01-25 DIAGNOSIS — M545 Low back pain: Secondary | ICD-10-CM | POA: Diagnosis not present

## 2018-02-01 ENCOUNTER — Ambulatory Visit (INDEPENDENT_AMBULATORY_CARE_PROVIDER_SITE_OTHER): Payer: Medicare Other | Admitting: Podiatry

## 2018-02-01 ENCOUNTER — Encounter: Payer: Self-pay | Admitting: Podiatry

## 2018-02-01 ENCOUNTER — Other Ambulatory Visit: Payer: Self-pay

## 2018-02-01 ENCOUNTER — Other Ambulatory Visit: Payer: Self-pay | Admitting: Podiatry

## 2018-02-01 ENCOUNTER — Ambulatory Visit (INDEPENDENT_AMBULATORY_CARE_PROVIDER_SITE_OTHER): Payer: Medicare Other

## 2018-02-01 DIAGNOSIS — M2042 Other hammer toe(s) (acquired), left foot: Secondary | ICD-10-CM

## 2018-02-01 DIAGNOSIS — M21619 Bunion of unspecified foot: Secondary | ICD-10-CM

## 2018-02-01 DIAGNOSIS — M79672 Pain in left foot: Secondary | ICD-10-CM

## 2018-02-01 DIAGNOSIS — M2041 Other hammer toe(s) (acquired), right foot: Secondary | ICD-10-CM | POA: Diagnosis not present

## 2018-02-01 NOTE — Progress Notes (Signed)
   Subjective:    Patient ID: Heidi [name], female    DOB: Apr 04, 1935, 83 y.o.   MRN: [id number]  HPI    Review of Systems  Musculoskeletal: Positive for arthralgias and myalgias.  All other systems reviewed and are negative.      Objective:   Physical Exam        Assessment & Plan:

## 2018-02-01 NOTE — Progress Notes (Signed)
Subjective:   Patient ID: Heidi [name], female   DOB: 83 y.o.   MRN: [id number]   HPI Patient presents stating she is got a lot of deformity in her left foot with bunion deformity digital deformity and elevation of the second toe of a significant nature.  Patient's found to have good digital perfusion and is well oriented x3 and does not smoke and likes to be active   Review of Systems  All other systems reviewed and are negative.       Objective:  Physical Exam  Constitutional: She appears well-developed and well-nourished.  Cardiovascular: Intact distal pulses.  Pulmonary/Chest: Effort normal.  Musculoskeletal: Normal range of motion.  Neurological: She is alert.  Skin: Skin is warm.  Nursing note and vitals reviewed.   Neurovascular status was found to be intact muscle strength was adequate range of motion was found to be with in normal limits with patient noted to have significant foot structural deformity left over right with elevation of the lesser digits and rigid contracture of the toes.  Patient is noted to have significant bunion deformity of the second toe does not contact the surface     Assessment:  Rigid hammertoe deformity digit to left with severe bunion deformity but second digit that does not contact the surface     Plan:  H&P condition and x-rays reviewed with patient and discussed treatment options.  I do think if the toe itself becomes the primary concern digital amputation could be done and also foot reconstruction could be considered.  At this point we will try padding of the toe see the results and shoe gear modifications  X-rays indicate that there is severe structural deformity left over right with bunion deformity bilateral and rigid contracture of the lesser digit

## 2018-02-01 NOTE — Patient Instructions (Signed)
Hammer Toe Hammer toe is a change in the shape (a deformity) of your second, third, or fourth toe. The deformity causes the middle joint of your toe to stay bent. This causes pain, especially when you are wearing shoes. Hammer toe starts gradually. At first, the toe can be straightened. Gradually over time, the deformity becomes stiff and permanent. Early treatments to keep the toe straight may relieve pain. As the deformity becomes stiff and permanent, surgery may be needed to straighten the toe. What are the causes? Hammer toe is caused by abnormal bending of the toe joint that is closest to your foot. It happens gradually over time. This pulls on the muscles and connections (tendons) of the toe joint, making them weak and stiff. It is often related to wearing shoes that are too short or narrow and do not let your toes straighten. What increases the risk? You may be at greater risk for hammer toe if you:  Are female.  Are older.  Wear shoes that are too small.  Wear high-heeled shoes that pinch your toes.  Are a Engineer, mining.  Have a second toe that is longer than your big toe (first toe).  Injure your foot or toe.  Have arthritis.  Have a family history of hammer toe.  Have a nerve or muscle disorder.  What are the signs or symptoms? The main symptoms of this condition are pain and deformity of the toe. The pain is worse when wearing shoes, walking, or running. Other symptoms may include:  Corns or calluses over the bent part of the toe or between the toes.  Redness and a burning feeling on the toe.  An open sore that forms on the top of the toe.  Not being able to straighten the toe.  How is this diagnosed? This condition is diagnosed based on your symptoms and a physical exam. During the exam, your health care provider will try to straighten your toe to see how stiff the deformity is. You may also have tests, such as:  A blood test to check for rheumatoid  arthritis.  An X-ray to show how severe the deformity is.  How is this treated? Treatment for this condition will depend on how stiff the deformity is. Surgery is often needed. However, sometimes a hammer toe can be straightened without surgery. Treatments that do not involve surgery include:  Taping the toe into a straightened position.  Using pads and cushions to protect the toe (orthotics).  Wearing shoes that provide enough room for the toes.  Doing toe-stretching exercises at home.  Taking an NSAID to reduce pain and swelling.  If these treatments do not help or the toe cannot be straightened, surgery is the next option. The most common surgeries used to straighten a hammer toe include:  Arthroplasty. In this procedure, part of the joint is removed, and that allows the toe to straighten.  Fusion. In this procedure, cartilage between the two bones of the joint is taken out and the bones are fused together into one longer bone.  Implantation. In this procedure, part of the bone is removed and replaced with an implant to let the toe move again.  Flexor tendon transfer. In this procedure, the tendons that curl the toes down (flexor tendons) are repositioned.  Follow these instructions at home:  Take over-the-counter and prescription medicines only as told by your health care provider.  Do toe straightening and stretching exercises as told by your health care provider.  Keep all  follow-up visits as told by your health care provider. This is important. How is this prevented?  Wear shoes that give your toes enough room and do not cause pain.  Do not wear high-heeled shoes. Contact a health care provider if:  Your pain gets worse.  Your toe becomes red or swollen.  You develop an open sore on your toe. This information is not intended to replace advice given to you by your health care provider. Make sure you discuss any questions you have with your health care  provider. Document Released: 04/18/2000 Document Revised: 11/09/2015 Document Reviewed: 08/15/2015 Elsevier Interactive Patient Education  [employer].

## 2018-02-09 ENCOUNTER — Telehealth: Payer: Self-pay | Admitting: Gastroenterology

## 2018-02-09 ENCOUNTER — Encounter: Payer: Self-pay | Admitting: Gastroenterology

## 2018-02-09 NOTE — Progress Notes (Signed)
Unable to reach patient by phone for October labs. A letter has been sent to patient.

## 2018-02-10 ENCOUNTER — Telehealth: Payer: Self-pay | Admitting: Gastroenterology

## 2018-02-10 ENCOUNTER — Other Ambulatory Visit (INDEPENDENT_AMBULATORY_CARE_PROVIDER_SITE_OTHER): Payer: Medicare Other

## 2018-02-10 DIAGNOSIS — K5130 Ulcerative (chronic) rectosigmoiditis without complications: Secondary | ICD-10-CM | POA: Diagnosis not present

## 2018-02-10 DIAGNOSIS — K743 Primary biliary cirrhosis: Secondary | ICD-10-CM

## 2018-02-10 DIAGNOSIS — Z85038 Personal history of other malignant neoplasm of large intestine: Secondary | ICD-10-CM

## 2018-02-10 LAB — HEPATIC FUNCTION PANEL
ALBUMIN: 3.5 g/dL (ref 3.5–5.2)
ALT: 10 U/L (ref 0–35)
AST: 15 U/L (ref 0–37)
Alkaline Phosphatase: 59 U/L (ref 39–117)
Bilirubin, Direct: 0.1 mg/dL (ref 0.0–0.3)
Total Bilirubin: 0.6 mg/dL (ref 0.2–1.2)
Total Protein: 6.3 g/dL (ref 6.0–8.3)

## 2018-02-10 LAB — TSH: TSH: 1.57 u[IU]/mL (ref 0.35–4.50)

## 2018-02-10 NOTE — Telephone Encounter (Signed)
Heidi [name], this patient states that her insurance company should have contacted [place] or sent [place] something about her [place]. She is applying for assistance with these medications. Please call her, when you can, to let her know if you have received anything. Thank you.

## 2018-02-10 NOTE — Telephone Encounter (Signed)
Approved. Patient notified.

## 2018-02-10 NOTE — Telephone Encounter (Signed)
Chelsea from [employer] called to inform that generic for Lialda and Actigal have been approved for one year, approval expires on 02/10/2019. She mentioned that she already spoke with patient to let her know.

## 2018-03-01 ENCOUNTER — Other Ambulatory Visit: Payer: Self-pay | Admitting: Gastroenterology

## 2018-03-31 ENCOUNTER — Encounter (INDEPENDENT_AMBULATORY_CARE_PROVIDER_SITE_OTHER): Payer: Medicare Other | Admitting: Ophthalmology

## 2018-03-31 DIAGNOSIS — I10 Essential (primary) hypertension: Secondary | ICD-10-CM

## 2018-03-31 DIAGNOSIS — H353112 Nonexudative age-related macular degeneration, right eye, intermediate dry stage: Secondary | ICD-10-CM

## 2018-03-31 DIAGNOSIS — H353121 Nonexudative age-related macular degeneration, left eye, early dry stage: Secondary | ICD-10-CM | POA: Diagnosis not present

## 2018-03-31 DIAGNOSIS — H43813 Vitreous degeneration, bilateral: Secondary | ICD-10-CM

## 2018-03-31 DIAGNOSIS — H35033 Hypertensive retinopathy, bilateral: Secondary | ICD-10-CM

## 2018-05-25 DIAGNOSIS — H903 Sensorineural hearing loss, bilateral: Secondary | ICD-10-CM | POA: Diagnosis not present

## 2018-05-28 DIAGNOSIS — J300 Vasomotor rhinitis: Secondary | ICD-10-CM | POA: Diagnosis not present

## 2018-05-28 DIAGNOSIS — H6123 Impacted cerumen, bilateral: Secondary | ICD-10-CM | POA: Diagnosis not present

## 2018-06-02 DIAGNOSIS — L570 Actinic keratosis: Secondary | ICD-10-CM | POA: Diagnosis not present

## 2018-06-02 DIAGNOSIS — L720 Epidermal cyst: Secondary | ICD-10-CM | POA: Diagnosis not present

## 2018-06-02 DIAGNOSIS — Z85828 Personal history of other malignant neoplasm of skin: Secondary | ICD-10-CM | POA: Diagnosis not present

## 2018-07-12 DIAGNOSIS — M859 Disorder of bone density and structure, unspecified: Secondary | ICD-10-CM | POA: Diagnosis not present

## 2018-07-12 DIAGNOSIS — E7849 Other hyperlipidemia: Secondary | ICD-10-CM | POA: Diagnosis not present

## 2018-07-12 DIAGNOSIS — I10 Essential (primary) hypertension: Secondary | ICD-10-CM | POA: Diagnosis not present

## 2018-07-12 DIAGNOSIS — E538 Deficiency of other specified B group vitamins: Secondary | ICD-10-CM | POA: Diagnosis not present

## 2018-07-12 DIAGNOSIS — M8589 Other specified disorders of bone density and structure, multiple sites: Secondary | ICD-10-CM | POA: Diagnosis not present

## 2018-07-12 DIAGNOSIS — R82998 Other abnormal findings in urine: Secondary | ICD-10-CM | POA: Diagnosis not present

## 2018-07-19 DIAGNOSIS — Z1331 Encounter for screening for depression: Secondary | ICD-10-CM | POA: Diagnosis not present

## 2018-07-19 DIAGNOSIS — J309 Allergic rhinitis, unspecified: Secondary | ICD-10-CM | POA: Diagnosis not present

## 2018-07-19 DIAGNOSIS — M5416 Radiculopathy, lumbar region: Secondary | ICD-10-CM | POA: Diagnosis not present

## 2018-07-19 DIAGNOSIS — R05 Cough: Secondary | ICD-10-CM | POA: Diagnosis not present

## 2018-07-19 DIAGNOSIS — Z0000 Encounter for general adult medical examination without abnormal findings: Secondary | ICD-10-CM | POA: Diagnosis not present

## 2018-07-19 DIAGNOSIS — C189 Malignant neoplasm of colon, unspecified: Secondary | ICD-10-CM | POA: Diagnosis not present

## 2018-07-19 DIAGNOSIS — M2032 Hallux varus (acquired), left foot: Secondary | ICD-10-CM | POA: Diagnosis not present

## 2018-07-19 DIAGNOSIS — K743 Primary biliary cirrhosis: Secondary | ICD-10-CM | POA: Diagnosis not present

## 2018-07-19 DIAGNOSIS — M7122 Synovial cyst of popliteal space [Baker], left knee: Secondary | ICD-10-CM | POA: Diagnosis not present

## 2018-07-19 DIAGNOSIS — H6983 Other specified disorders of Eustachian tube, bilateral: Secondary | ICD-10-CM | POA: Diagnosis not present

## 2018-07-19 DIAGNOSIS — H9193 Unspecified hearing loss, bilateral: Secondary | ICD-10-CM | POA: Diagnosis not present

## 2018-07-19 DIAGNOSIS — K219 Gastro-esophageal reflux disease without esophagitis: Secondary | ICD-10-CM | POA: Diagnosis not present

## 2018-08-19 ENCOUNTER — Other Ambulatory Visit (HOSPITAL_COMMUNITY): Payer: Self-pay | Admitting: Internal Medicine

## 2018-08-19 DIAGNOSIS — R0989 Other specified symptoms and signs involving the circulatory and respiratory systems: Secondary | ICD-10-CM

## 2018-08-19 DIAGNOSIS — M79675 Pain in left toe(s): Secondary | ICD-10-CM

## 2018-08-20 ENCOUNTER — Other Ambulatory Visit: Payer: Self-pay | Admitting: Gastroenterology

## 2018-08-25 ENCOUNTER — Encounter (HOSPITAL_COMMUNITY): Payer: Medicare Other

## 2018-09-13 ENCOUNTER — Telehealth (HOSPITAL_COMMUNITY): Payer: Self-pay

## 2018-09-13 NOTE — Telephone Encounter (Signed)
The above patient or their representative was contacted and gave the following answers to these questions:         Do you have any of the following symptoms? Chronic cough several months  Fever                    Cough                   Shortness of breath  Do  you have any of the following other symptoms?    muscle pain         vomiting,        diarrhea        rash         weakness        red eye        abdominal pain         bruising          bruising or bleeding              joint pain           severe headache    Have you been in contact with someone who was or has been sick in the past 2 weeks? No  Yes                 Unsure                         Unable to assess   Does the person that you were in contact with have any of the following symptoms?   Cough         shortness of breath           muscle pain         vomiting,            diarrhea            rash            weakness           fever            red eye           abdominal pain           bruising  or  bleeding                joint pain                severe headache               Have you  or someone you have been in contact with traveled internationally in th last month?     No    If yes, which countries?   Have you  or someone you have been in contact with traveled outside [place] in th last month?   No      If yes, which state and city?   COMMENTS OR ACTION PLAN FOR THIS PATIENT:

## 2018-09-15 ENCOUNTER — Ambulatory Visit (HOSPITAL_COMMUNITY)
Admission: RE | Admit: 2018-09-15 | Discharge: 2018-09-15 | Disposition: A | Discharge status: Home / Self Care | Payer: Medicare Other | Source: Ambulatory Visit | Attending: Internal Medicine | Admitting: Internal Medicine

## 2018-09-15 ENCOUNTER — Other Ambulatory Visit: Payer: Self-pay

## 2018-09-15 DIAGNOSIS — M79675 Pain in left toe(s): Secondary | ICD-10-CM | POA: Diagnosis not present

## 2018-09-15 DIAGNOSIS — R0989 Other specified symptoms and signs involving the circulatory and respiratory systems: Secondary | ICD-10-CM | POA: Diagnosis not present

## 2018-09-22 ENCOUNTER — Other Ambulatory Visit: Payer: Self-pay | Admitting: Gastroenterology

## 2018-10-12 DIAGNOSIS — Z7289 Other problems related to lifestyle: Secondary | ICD-10-CM | POA: Diagnosis not present

## 2018-10-12 DIAGNOSIS — H9113 Presbycusis, bilateral: Secondary | ICD-10-CM | POA: Diagnosis not present

## 2018-10-12 DIAGNOSIS — K219 Gastro-esophageal reflux disease without esophagitis: Secondary | ICD-10-CM | POA: Diagnosis not present

## 2018-10-12 DIAGNOSIS — J302 Other seasonal allergic rhinitis: Secondary | ICD-10-CM | POA: Diagnosis not present

## 2018-10-12 DIAGNOSIS — Z87891 Personal history of nicotine dependence: Secondary | ICD-10-CM | POA: Diagnosis not present

## 2018-10-12 DIAGNOSIS — Z974 Presence of external hearing-aid: Secondary | ICD-10-CM | POA: Diagnosis not present

## 2018-11-11 DIAGNOSIS — J310 Chronic rhinitis: Secondary | ICD-10-CM | POA: Diagnosis not present

## 2018-11-11 DIAGNOSIS — K219 Gastro-esophageal reflux disease without esophagitis: Secondary | ICD-10-CM | POA: Diagnosis not present

## 2018-11-11 DIAGNOSIS — H6123 Impacted cerumen, bilateral: Secondary | ICD-10-CM | POA: Diagnosis not present

## 2018-11-11 DIAGNOSIS — H9113 Presbycusis, bilateral: Secondary | ICD-10-CM | POA: Diagnosis not present

## 2018-11-11 DIAGNOSIS — H903 Sensorineural hearing loss, bilateral: Secondary | ICD-10-CM | POA: Diagnosis not present

## 2018-12-09 ENCOUNTER — Telehealth: Payer: Self-pay | Admitting: Gastroenterology

## 2018-12-09 NOTE — Telephone Encounter (Signed)
Scheduled pt for OV next month to discuss.

## 2018-12-10 DIAGNOSIS — Z1231 Encounter for screening mammogram for malignant neoplasm of breast: Secondary | ICD-10-CM | POA: Diagnosis not present

## 2018-12-10 NOTE — Telephone Encounter (Signed)
Thanks Magda Paganini. That's fine with me

## 2018-12-10 NOTE — Telephone Encounter (Signed)
Chart reviewed. I saw her last Sept and she had strongly wished to continue surveillance at that time given her history of UC and numerous polyps. If she wants to proceed can direct book at [facility] but she is also due for office visit to discuss other issues if you can help coordinate that as well. Thanks

## 2018-12-10 NOTE — Telephone Encounter (Signed)
Heidi [name] is off today so I called this patient to discuss her colonoscopy.  The office visit that was already scheduled was really only to discuss the colonoscopy.  She did not feel she needed to talk to you about anything else at this time.  She mentioned hemorrhoids but wanted to wait to see you in the office if they became problematic at a different time.  She requested I cancel the office visit so instead we scheduled a previsit and colonoscopy date for mid September.  Let me know if you want me to do anything different with this.  Thanks!

## 2018-12-24 DIAGNOSIS — Z23 Encounter for immunization: Secondary | ICD-10-CM | POA: Diagnosis not present

## 2019-01-12 ENCOUNTER — Ambulatory Visit (AMBULATORY_SURGERY_CENTER): Payer: Self-pay | Admitting: *Deleted

## 2019-01-12 ENCOUNTER — Other Ambulatory Visit: Payer: Self-pay

## 2019-01-12 VITALS — Temp 96.2°F | Ht 61.0 in | Wt 139.2 lb

## 2019-01-12 DIAGNOSIS — Z85038 Personal history of other malignant neoplasm of large intestine: Secondary | ICD-10-CM

## 2019-01-12 MED ORDER — NA SULFATE-K SULFATE-MG SULF 17.5-3.13-1.6 GM/177ML PO SOLN: 1.0000 | Freq: Once | ORAL | 0 refills | Status: AC

## 2019-01-12 NOTE — Progress Notes (Signed)
No egg or soy allergy known to patient  No issues with past sedation with any surgeries  or procedures, no intubation problems  No diet pills per patient No home 02 use per patient  No blood thinners per patient  Pt denies issues with constipation  No A fib or A flutter  EMMI video sent to pt's e mail

## 2019-01-18 ENCOUNTER — Telehealth: Payer: Self-pay

## 2019-01-18 ENCOUNTER — Encounter: Payer: Medicare Other | Admitting: Internal Medicine

## 2019-01-18 NOTE — Telephone Encounter (Signed)
Covid-19 screening questions   Do you now or have you had a fever in the last 14 days? NO   Do you have any respiratory symptoms of shortness of breath or cough now or in the last 14 days? NO   Do you have any family members or close contacts with diagnosed or suspected Covid-19 in the past 14 days? NO   Have you been tested for Covid-19 and found to be positive? NO

## 2019-01-19 ENCOUNTER — Ambulatory Visit (AMBULATORY_SURGERY_CENTER): Payer: Medicare Other | Admitting: Gastroenterology

## 2019-01-19 ENCOUNTER — Encounter: Payer: Self-pay | Admitting: Gastroenterology

## 2019-01-19 ENCOUNTER — Other Ambulatory Visit: Payer: Self-pay

## 2019-01-19 ENCOUNTER — Other Ambulatory Visit: Payer: Self-pay | Admitting: Gastroenterology

## 2019-01-19 VITALS — BP 140/79 | HR 60 | Temp 97.6°F | Resp 13 | Ht 61.0 in | Wt 139.0 lb

## 2019-01-19 DIAGNOSIS — D123 Benign neoplasm of transverse colon: Secondary | ICD-10-CM

## 2019-01-19 DIAGNOSIS — K621 Rectal polyp: Secondary | ICD-10-CM

## 2019-01-19 DIAGNOSIS — K635 Polyp of colon: Secondary | ICD-10-CM | POA: Diagnosis not present

## 2019-01-19 DIAGNOSIS — Z85038 Personal history of other malignant neoplasm of large intestine: Secondary | ICD-10-CM

## 2019-01-19 DIAGNOSIS — I739 Peripheral vascular disease, unspecified: Secondary | ICD-10-CM | POA: Diagnosis not present

## 2019-01-19 DIAGNOSIS — K5180 Other ulcerative colitis without complications: Secondary | ICD-10-CM

## 2019-01-19 DIAGNOSIS — D127 Benign neoplasm of rectosigmoid junction: Secondary | ICD-10-CM | POA: Diagnosis not present

## 2019-01-19 DIAGNOSIS — I10 Essential (primary) hypertension: Secondary | ICD-10-CM | POA: Diagnosis not present

## 2019-01-19 MED ORDER — SODIUM CHLORIDE 0.9 % IV SOLN: 500.0000 mL | Freq: Once | INTRAVENOUS | Status: DC

## 2019-01-19 NOTE — Progress Notes (Signed)
Called to room to assist during endoscopic procedure.  Patient ID and intended procedure confirmed with present staff. Received instructions for my participation in the procedure from the performing physician.  

## 2019-01-19 NOTE — Progress Notes (Signed)
Temperature- Heidi [name]  Pt's states no medical or surgical changes since previsit or office visit.

## 2019-01-19 NOTE — Progress Notes (Signed)
Report given to PACU, vss 

## 2019-01-19 NOTE — Op Note (Signed)
[facility] Patient Name: Heidi [name] Procedure Date: 01/19/2019 1:28 PM MRN: [id number] Endoscopist: Remo Lipps P. 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 Patient strongly                            wishes to have surveillance colonoscopy Medicines:                Monitored Anesthesia Care Procedure:                Pre-Anesthesia Assessment:                           - Prior to the procedure, a History and Physical                            was performed, and patient medications and                            allergies were reviewed. The patient's tolerance of                            previous anesthesia was also reviewed. The risks                            and benefits of the procedure and the sedation                            options and risks were discussed with the patient.                            All questions were answered, and informed consent                            was obtained. Prior Anticoagulants: The patient has                            taken no previous anticoagulant or antiplatelet                            agents. ASA Grade Assessment: III - A patient with  severe systemic disease. After reviewing the risks                            and benefits, the patient was deemed in                            satisfactory condition to undergo the procedure.                           After obtaining informed consent, the colonoscope                            was passed under direct vision. Throughout the                            procedure, the  patient's blood pressure, pulse, and                            oxygen saturations were monitored continuously. The                            Colonoscope was introduced through the anus and                            advanced to the the terminal ileum. The colonoscopy                            was performed without difficulty. The patient                            tolerated the procedure well. The quality of the                            bowel preparation was good. The terminal ileum,                            surgical anastomosis, and the rectum were                            photographed. Scope In: 1:32:58 PM Scope Out: 1:59:11 PM Scope Withdrawal Time: 0 hours 21 minutes 48 seconds  Total Procedure Duration: 0 hours 26 minutes 13 seconds  Findings:                 The perianal and digital rectal examinations were                            normal.                           The terminal ileum appeared normal.                           There was evidence of a prior end-to-side  ileo-colonic anastomosis in the ascending colon.                            This was patent and was characterized by healthy                            appearing mucosa.                           A 3 mm polyp was found in the transverse colon. The                            polyp was flat. The polyp was removed with a cold                            snare. Resection and retrieval were complete.                           Numerous flat polyps were noted in the rectum and                            rectosigmoid colon consistent with benign                            hyperplastic polyps. Two representative polyps in                            the recto-sigmoid colon, 5 to 6 mm in size, were                            removed with a cold snare as a representative                            sample. Resection and retrieval were complete.                           Multiple large-mouthed  diverticula were found in                            the sigmoid colon.                           The exam was otherwise without abnormality.                           Biopsies were taken with a cold forceps from the                            descending colon, sigmoid colon and rectum for                            ulcerative colitis surveillance. These biopsy  specimens were sent to Pathology. Complications:            No immediate complications. Estimated blood loss:                            Minimal. Estimated Blood Loss:     Estimated blood loss was minimal. Impression:               - The examined portion of the ileum was normal.                           - Patent end-to-side ileo-colonic anastomosis,                            characterized by healthy appearing mucosa.                           - One 3 mm polyp in the transverse colon, removed                            with a cold snare. Resected and retrieved.                           - Numerous flat polyps of the lower left colon                            consistent with benign hyperplastic polyps. Two of                            them were removed as a representative sample.                           - Diverticulosis in the sigmoid colon.                           - The examination was otherwise normal. No overt                            inflammation noted - good control of colitis                           - Biopsies for surveillance were taken from the                            descending colon, sigmoid colon and rectum. Recommendation:           - Patient has a contact number available for                            emergencies. The signs and symptoms of potential                            delayed complications were discussed with the  patient. Return to normal activities tomorrow.                            Written discharge instructions were provided to the                             patient.                           - Resume previous diet.                           - Continue present medications.                           - Await pathology results. Remo Lipps P. Armbruster, MD 01/19/2019 2:11:00 PM This report has been signed electronically.

## 2019-01-19 NOTE — Patient Instructions (Signed)
Please read handouts provided. Continue present medications. Await pathology results.       YOU HAD AN ENDOSCOPIC PROCEDURE TODAY AT [facility]:   Refer to the procedure report that was given to you for any specific questions about what was found during the examination.  If the procedure report does not answer your questions, please call your gastroenterologist to clarify.  If you requested that your care partner not be given the details of your procedure findings, then the procedure report has been included in a sealed envelope for you to review at your convenience later.  YOU SHOULD EXPECT: Some feelings of bloating in the abdomen. Passage of more gas than usual.  Walking can help get rid of the air that was put into your GI tract during the procedure and reduce the bloating. If you had a lower endoscopy (such as a colonoscopy or flexible sigmoidoscopy) you may notice spotting of blood in your stool or on the toilet paper. If you underwent a bowel prep for your procedure, you may not have a normal bowel movement for a few days.  Please Note:  You might notice some irritation and congestion in your nose or some drainage.  This is from the oxygen used during your procedure.  There is no need for concern and it should clear up in a day or so.  SYMPTOMS TO REPORT IMMEDIATELY:   Following lower endoscopy (colonoscopy or flexible sigmoidoscopy):  Excessive amounts of blood in the stool  Significant tenderness or worsening of abdominal pains  Swelling of the abdomen that is new, acute  Fever of 100F or higher   For urgent or emergent issues, a gastroenterologist can be reached at any hour by calling [phone].   DIET:  We do recommend a small meal at first, but then you may proceed to your regular diet.  Drink plenty of fluids but you should avoid alcoholic beverages for 24 hours.  ACTIVITY:  You should plan to take it easy for the rest of today and you should NOT  DRIVE or use heavy machinery until tomorrow (because of the sedation medicines used during the test).    FOLLOW UP: Our staff will call the number listed on your records 48-72 hours following your procedure to check on you and address any questions or concerns that you may have regarding the information given to you following your procedure. If we do not reach you, we will leave a message.  We will attempt to reach you two times.  During this call, we will ask if you have developed any symptoms of COVID 19. If you develop any symptoms (ie: fever, flu-like symptoms, shortness of breath, cough etc.) before then, please call [phone].  If you test positive for Covid 19 in the 2 weeks post procedure, please call and report this information to [place].    If any biopsies were taken you will be contacted by phone or by letter within the next 1-3 weeks.  Please call us at [phone] if you have not heard about the biopsies in 3 weeks.    SIGNATURES/CONFIDENTIALITY: You and/or your care partner have signed paperwork which will be entered into your electronic medical record.  These signatures attest to the fact that that the information above on your After Visit Summary has been reviewed and is understood.  Full responsibility of the confidentiality of this discharge information lies with you and/or your care-partner.

## 2019-01-21 ENCOUNTER — Telehealth: Payer: Self-pay

## 2019-01-21 NOTE — Telephone Encounter (Signed)
  Follow up Call-  Call back number 01/19/2019 10/08/2017 10/10/2016  Post procedure Call Back phone  # [phone]  Permission to leave phone message Yes Yes Yes  Some recent data might be hidden     Patient questions:  Do you have a fever, pain , or abdominal swelling? No. Pain Score  0 *  Have you tolerated food without any problems? Yes.    Have you been able to return to your normal activities? Yes.    Do you have any questions about your discharge instructions: Diet   No. Medications  No. Follow up visit  No.  Do you have questions or concerns about your Care? No.  Actions: * If pain score is 4 or above: No action needed, pain <4.  1. Have you developed a fever since your procedure? no  2.   Have you had an respiratory symptoms (SOB or cough) since your procedure? no  3.   Have you tested positive for COVID 19 since your procedure no  4.   Have you had any family members/close contacts diagnosed with the COVID 19 since your procedure?  no   If yes to any of these questions please route to Joylene John, RN and Alphonsa Gin, Therapist, sports.

## 2019-01-25 ENCOUNTER — Ambulatory Visit: Payer: Medicare Other | Admitting: Gastroenterology

## 2019-02-08 ENCOUNTER — Telehealth: Payer: Self-pay | Admitting: Gastroenterology

## 2019-02-08 NOTE — Telephone Encounter (Signed)
Noted. We will take care of them if appropriate as soon as we see them.

## 2019-02-08 NOTE — Telephone Encounter (Signed)
Pt stated that [employer] will be faxing over 2 forms for Dr. Havery Moros to sign regarding her medications.

## 2019-02-10 ENCOUNTER — Telehealth: Payer: Self-pay | Admitting: *Deleted

## 2019-02-10 NOTE — Telephone Encounter (Signed)
We have received authorization for Tier exception on Mesalamine 1.2 grams until 02/08/20; now approved at Tier 2.  We have also received approval for Ursodiol 300 mg capsule Tier exception until 02/08/20; now approved at Tier 2.  Look under "media" tab in Epic for original faxed documentation.

## 2019-04-04 ENCOUNTER — Encounter (INDEPENDENT_AMBULATORY_CARE_PROVIDER_SITE_OTHER): Payer: Medicare Other | Admitting: Ophthalmology

## 2019-04-04 DIAGNOSIS — H35033 Hypertensive retinopathy, bilateral: Secondary | ICD-10-CM | POA: Diagnosis not present

## 2019-04-04 DIAGNOSIS — H43813 Vitreous degeneration, bilateral: Secondary | ICD-10-CM | POA: Diagnosis not present

## 2019-04-04 DIAGNOSIS — I10 Essential (primary) hypertension: Secondary | ICD-10-CM

## 2019-04-04 DIAGNOSIS — H353132 Nonexudative age-related macular degeneration, bilateral, intermediate dry stage: Secondary | ICD-10-CM | POA: Diagnosis not present

## 2019-04-22 ENCOUNTER — Other Ambulatory Visit: Payer: Self-pay | Admitting: Gastroenterology

## 2019-08-08 DIAGNOSIS — E7849 Other hyperlipidemia: Secondary | ICD-10-CM | POA: Diagnosis not present

## 2019-08-08 DIAGNOSIS — M859 Disorder of bone density and structure, unspecified: Secondary | ICD-10-CM | POA: Diagnosis not present

## 2019-08-10 DIAGNOSIS — I10 Essential (primary) hypertension: Secondary | ICD-10-CM | POA: Diagnosis not present

## 2019-08-10 DIAGNOSIS — R82998 Other abnormal findings in urine: Secondary | ICD-10-CM | POA: Diagnosis not present

## 2019-08-15 DIAGNOSIS — M5416 Radiculopathy, lumbar region: Secondary | ICD-10-CM | POA: Diagnosis not present

## 2019-08-15 DIAGNOSIS — J309 Allergic rhinitis, unspecified: Secondary | ICD-10-CM | POA: Diagnosis not present

## 2019-08-15 DIAGNOSIS — M79676 Pain in unspecified toe(s): Secondary | ICD-10-CM | POA: Diagnosis not present

## 2019-08-15 DIAGNOSIS — M7122 Synovial cyst of popliteal space [Baker], left knee: Secondary | ICD-10-CM | POA: Diagnosis not present

## 2019-08-15 DIAGNOSIS — M25552 Pain in left hip: Secondary | ICD-10-CM | POA: Diagnosis not present

## 2019-08-15 DIAGNOSIS — K449 Diaphragmatic hernia without obstruction or gangrene: Secondary | ICD-10-CM | POA: Diagnosis not present

## 2019-08-15 DIAGNOSIS — B373 Candidiasis of vulva and vagina: Secondary | ICD-10-CM | POA: Diagnosis not present

## 2019-08-15 DIAGNOSIS — H9190 Unspecified hearing loss, unspecified ear: Secondary | ICD-10-CM | POA: Diagnosis not present

## 2019-08-15 DIAGNOSIS — Z1339 Encounter for screening examination for other mental health and behavioral disorders: Secondary | ICD-10-CM | POA: Diagnosis not present

## 2019-08-15 DIAGNOSIS — K743 Primary biliary cirrhosis: Secondary | ICD-10-CM | POA: Diagnosis not present

## 2019-08-15 DIAGNOSIS — M8589 Other specified disorders of bone density and structure, multiple sites: Secondary | ICD-10-CM | POA: Diagnosis not present

## 2019-08-15 DIAGNOSIS — Z85038 Personal history of other malignant neoplasm of large intestine: Secondary | ICD-10-CM | POA: Diagnosis not present

## 2019-08-15 DIAGNOSIS — Z1331 Encounter for screening for depression: Secondary | ICD-10-CM | POA: Diagnosis not present

## 2019-08-15 DIAGNOSIS — Z0000 Encounter for general adult medical examination without abnormal findings: Secondary | ICD-10-CM | POA: Diagnosis not present

## 2019-09-12 ENCOUNTER — Other Ambulatory Visit: Payer: Self-pay | Admitting: Gastroenterology

## 2019-09-19 ENCOUNTER — Telehealth: Payer: Self-pay | Admitting: Gastroenterology

## 2019-09-19 NOTE — Telephone Encounter (Signed)
ERROR

## 2019-09-25 ENCOUNTER — Emergency Department (HOSPITAL_COMMUNITY): Payer: Medicare Other

## 2019-09-25 ENCOUNTER — Inpatient Hospital Stay (HOSPITAL_COMMUNITY)
Admission: EM | Admit: 2019-09-25 | Discharge: 2019-09-29 | DRG: 511 | Disposition: A | Discharge status: Skilled Nursing Facility | Payer: Medicare Other | Attending: Internal Medicine | Admitting: Internal Medicine

## 2019-09-25 ENCOUNTER — Encounter (HOSPITAL_COMMUNITY): Payer: Self-pay | Admitting: Internal Medicine

## 2019-09-25 ENCOUNTER — Other Ambulatory Visit: Payer: Self-pay

## 2019-09-25 DIAGNOSIS — K5190 Ulcerative colitis, unspecified, without complications: Secondary | ICD-10-CM | POA: Diagnosis present

## 2019-09-25 DIAGNOSIS — S82009A Unspecified fracture of unspecified patella, initial encounter for closed fracture: Secondary | ICD-10-CM

## 2019-09-25 DIAGNOSIS — M419 Scoliosis, unspecified: Secondary | ICD-10-CM | POA: Diagnosis not present

## 2019-09-25 DIAGNOSIS — K743 Primary biliary cirrhosis: Secondary | ICD-10-CM | POA: Diagnosis present

## 2019-09-25 DIAGNOSIS — E785 Hyperlipidemia, unspecified: Secondary | ICD-10-CM | POA: Diagnosis present

## 2019-09-25 DIAGNOSIS — R8290 Unspecified abnormal findings in urine: Secondary | ICD-10-CM | POA: Diagnosis present

## 2019-09-25 DIAGNOSIS — Z801 Family history of malignant neoplasm of trachea, bronchus and lung: Secondary | ICD-10-CM

## 2019-09-25 DIAGNOSIS — Z885 Allergy status to narcotic agent status: Secondary | ICD-10-CM | POA: Diagnosis not present

## 2019-09-25 DIAGNOSIS — Z23 Encounter for immunization: Secondary | ICD-10-CM | POA: Diagnosis not present

## 2019-09-25 DIAGNOSIS — S82044A Nondisplaced comminuted fracture of right patella, initial encounter for closed fracture: Secondary | ICD-10-CM

## 2019-09-25 DIAGNOSIS — Y92830 Public park as the place of occurrence of the external cause: Secondary | ICD-10-CM | POA: Diagnosis not present

## 2019-09-25 DIAGNOSIS — K5790 Diverticulosis of intestine, part unspecified, without perforation or abscess without bleeding: Secondary | ICD-10-CM | POA: Diagnosis present

## 2019-09-25 DIAGNOSIS — Z20822 Contact with and (suspected) exposure to covid-19: Secondary | ICD-10-CM | POA: Diagnosis present

## 2019-09-25 DIAGNOSIS — Y93K1 Activity, walking an animal: Secondary | ICD-10-CM | POA: Diagnosis not present

## 2019-09-25 DIAGNOSIS — S01511A Laceration without foreign body of lip, initial encounter: Secondary | ICD-10-CM | POA: Diagnosis present

## 2019-09-25 DIAGNOSIS — G8929 Other chronic pain: Secondary | ICD-10-CM | POA: Diagnosis present

## 2019-09-25 DIAGNOSIS — Z03818 Encounter for observation for suspected exposure to other biological agents ruled out: Secondary | ICD-10-CM | POA: Diagnosis not present

## 2019-09-25 DIAGNOSIS — S82031A Displaced transverse fracture of right patella, initial encounter for closed fracture: Principal | ICD-10-CM | POA: Diagnosis present

## 2019-09-25 DIAGNOSIS — Z85828 Personal history of other malignant neoplasm of skin: Secondary | ICD-10-CM | POA: Diagnosis not present

## 2019-09-25 DIAGNOSIS — M79632 Pain in left forearm: Secondary | ICD-10-CM | POA: Diagnosis not present

## 2019-09-25 DIAGNOSIS — H9193 Unspecified hearing loss, bilateral: Secondary | ICD-10-CM | POA: Diagnosis not present

## 2019-09-25 DIAGNOSIS — S52502A Unspecified fracture of the lower end of left radius, initial encounter for closed fracture: Secondary | ICD-10-CM

## 2019-09-25 DIAGNOSIS — M2550 Pain in unspecified joint: Secondary | ICD-10-CM | POA: Diagnosis not present

## 2019-09-25 DIAGNOSIS — Z8249 Family history of ischemic heart disease and other diseases of the circulatory system: Secondary | ICD-10-CM | POA: Diagnosis not present

## 2019-09-25 DIAGNOSIS — H3530 Unspecified macular degeneration: Secondary | ICD-10-CM | POA: Diagnosis present

## 2019-09-25 DIAGNOSIS — S52572A Other intraarticular fracture of lower end of left radius, initial encounter for closed fracture: Secondary | ICD-10-CM | POA: Diagnosis present

## 2019-09-25 DIAGNOSIS — Z87891 Personal history of nicotine dependence: Secondary | ICD-10-CM

## 2019-09-25 DIAGNOSIS — Z85038 Personal history of other malignant neoplasm of large intestine: Secondary | ICD-10-CM | POA: Diagnosis not present

## 2019-09-25 DIAGNOSIS — Z880 Allergy status to penicillin: Secondary | ICD-10-CM | POA: Diagnosis not present

## 2019-09-25 DIAGNOSIS — Z8041 Family history of malignant neoplasm of ovary: Secondary | ICD-10-CM

## 2019-09-25 DIAGNOSIS — Z823 Family history of stroke: Secondary | ICD-10-CM | POA: Diagnosis not present

## 2019-09-25 DIAGNOSIS — Z9049 Acquired absence of other specified parts of digestive tract: Secondary | ICD-10-CM

## 2019-09-25 DIAGNOSIS — S199XXA Unspecified injury of neck, initial encounter: Secondary | ICD-10-CM | POA: Diagnosis not present

## 2019-09-25 DIAGNOSIS — S82001D Unspecified fracture of right patella, subsequent encounter for closed fracture with routine healing: Secondary | ICD-10-CM | POA: Diagnosis not present

## 2019-09-25 DIAGNOSIS — I10 Essential (primary) hypertension: Secondary | ICD-10-CM | POA: Diagnosis present

## 2019-09-25 DIAGNOSIS — M79604 Pain in right leg: Secondary | ICD-10-CM | POA: Diagnosis not present

## 2019-09-25 DIAGNOSIS — S0990XA Unspecified injury of head, initial encounter: Secondary | ICD-10-CM | POA: Diagnosis not present

## 2019-09-25 DIAGNOSIS — S01511D Laceration without foreign body of lip, subsequent encounter: Secondary | ICD-10-CM | POA: Diagnosis not present

## 2019-09-25 DIAGNOSIS — K745 Biliary cirrhosis, unspecified: Secondary | ICD-10-CM

## 2019-09-25 DIAGNOSIS — G8918 Other acute postprocedural pain: Secondary | ICD-10-CM | POA: Diagnosis not present

## 2019-09-25 DIAGNOSIS — Z7901 Long term (current) use of anticoagulants: Secondary | ICD-10-CM | POA: Diagnosis not present

## 2019-09-25 DIAGNOSIS — J309 Allergic rhinitis, unspecified: Secondary | ICD-10-CM | POA: Diagnosis not present

## 2019-09-25 DIAGNOSIS — S82041A Displaced comminuted fracture of right patella, initial encounter for closed fracture: Secondary | ICD-10-CM | POA: Diagnosis not present

## 2019-09-25 DIAGNOSIS — W010XXA Fall on same level from slipping, tripping and stumbling without subsequent striking against object, initial encounter: Secondary | ICD-10-CM | POA: Diagnosis present

## 2019-09-25 DIAGNOSIS — M545 Low back pain: Secondary | ICD-10-CM | POA: Diagnosis not present

## 2019-09-25 DIAGNOSIS — Z7401 Bed confinement status: Secondary | ICD-10-CM | POA: Diagnosis not present

## 2019-09-25 DIAGNOSIS — Z7983 Long term (current) use of bisphosphonates: Secondary | ICD-10-CM

## 2019-09-25 DIAGNOSIS — M1711 Unilateral primary osteoarthritis, right knee: Secondary | ICD-10-CM | POA: Diagnosis present

## 2019-09-25 DIAGNOSIS — M810 Age-related osteoporosis without current pathological fracture: Secondary | ICD-10-CM | POA: Diagnosis present

## 2019-09-25 DIAGNOSIS — T07XXXA Unspecified multiple injuries, initial encounter: Secondary | ICD-10-CM | POA: Diagnosis not present

## 2019-09-25 DIAGNOSIS — Z79899 Other long term (current) drug therapy: Secondary | ICD-10-CM

## 2019-09-25 DIAGNOSIS — S52502D Unspecified fracture of the lower end of left radius, subsequent encounter for closed fracture with routine healing: Secondary | ICD-10-CM | POA: Diagnosis not present

## 2019-09-25 DIAGNOSIS — W19XXXD Unspecified fall, subsequent encounter: Secondary | ICD-10-CM | POA: Diagnosis not present

## 2019-09-25 DIAGNOSIS — K5730 Diverticulosis of large intestine without perforation or abscess without bleeding: Secondary | ICD-10-CM | POA: Diagnosis not present

## 2019-09-25 DIAGNOSIS — S82001A Unspecified fracture of right patella, initial encounter for closed fracture: Secondary | ICD-10-CM

## 2019-09-25 DIAGNOSIS — K219 Gastro-esophageal reflux disease without esophagitis: Secondary | ICD-10-CM | POA: Diagnosis present

## 2019-09-25 DIAGNOSIS — R269 Unspecified abnormalities of gait and mobility: Secondary | ICD-10-CM | POA: Diagnosis not present

## 2019-09-25 DIAGNOSIS — W19XXXA Unspecified fall, initial encounter: Secondary | ICD-10-CM | POA: Diagnosis not present

## 2019-09-25 DIAGNOSIS — R52 Pain, unspecified: Secondary | ICD-10-CM | POA: Diagnosis not present

## 2019-09-25 DIAGNOSIS — Z4789 Encounter for other orthopedic aftercare: Secondary | ICD-10-CM | POA: Diagnosis not present

## 2019-09-25 DIAGNOSIS — Z803 Family history of malignant neoplasm of breast: Secondary | ICD-10-CM

## 2019-09-25 LAB — CBC WITH DIFFERENTIAL/PLATELET
Abs Immature Granulocytes: 0.04 10*3/uL (ref 0.00–0.07)
Basophils Absolute: 0 10*3/uL (ref 0.0–0.1)
Basophils Relative: 1 %
Eosinophils Absolute: 0.1 10*3/uL (ref 0.0–0.5)
Eosinophils Relative: 1 %
HCT: 43 % (ref 36.0–46.0)
Hemoglobin: 14.1 g/dL (ref 12.0–15.0)
Immature Granulocytes: 1 %
Lymphocytes Relative: 12 %
Lymphs Abs: 1 10*3/uL (ref 0.7–4.0)
MCH: 32.2 pg (ref 26.0–34.0)
MCHC: 32.8 g/dL (ref 30.0–36.0)
MCV: 98.2 fL (ref 80.0–100.0)
Monocytes Absolute: 0.5 10*3/uL (ref 0.1–1.0)
Monocytes Relative: 6 %
Neutro Abs: 6.8 10*3/uL (ref 1.7–7.7)
Neutrophils Relative %: 79 %
Platelets: 223 10*3/uL (ref 150–400)
RBC: 4.38 MIL/uL (ref 3.87–5.11)
RDW: 13.5 % (ref 11.5–15.5)
WBC: 8.4 10*3/uL (ref 4.0–10.5)
nRBC: 0 % (ref 0.0–0.2)

## 2019-09-25 LAB — BASIC METABOLIC PANEL
Anion gap: 7 (ref 5–15)
BUN: 17 mg/dL (ref 8–23)
CO2: 24 mmol/L (ref 22–32)
Calcium: 8.7 mg/dL — ABNORMAL LOW (ref 8.9–10.3)
Chloride: 108 mmol/L (ref 98–111)
Creatinine, Ser: 0.71 mg/dL (ref 0.44–1.00)
GFR calc Af Amer: >60 mL/min (ref >60)
GFR calc non Af Amer: >60 mL/min (ref >60)
Glucose, Bld: 100 mg/dL — ABNORMAL HIGH (ref 70–99)
Potassium: 4.2 mmol/L (ref 3.5–5.1)
Sodium: 139 mmol/L (ref 135–145)

## 2019-09-25 LAB — SARS CORONAVIRUS 2 BY RT PCR (HOSPITAL ORDER, PERFORMED IN ~~LOC~~ HOSPITAL LAB): SARS Coronavirus 2: NEGATIVE

## 2019-09-25 MED ORDER — LIDOCAINE-EPINEPHRINE (PF) 2 %-1:200000 IJ SOLN
10.0000 mL | Freq: Once | INTRAMUSCULAR | Status: AC
  Administered 2019-09-25: 10 mL
  Filled 2019-09-25: qty 20

## 2019-09-25 MED ORDER — PANTOPRAZOLE SODIUM 40 MG PO TBEC
40.0000 mg | DELAYED_RELEASE_TABLET | Freq: Every day | ORAL | Status: DC
  Administered 2019-09-25 – 2019-09-29 (×5): 40 mg via ORAL
  Filled 2019-09-25 (×6): qty 1

## 2019-09-25 MED ORDER — MORPHINE SULFATE (PF) 2 MG/ML IV SOLN
2.0000 mg | INTRAVENOUS | Status: DC | PRN
  Administered 2019-09-25 – 2019-09-26 (×3): 2 mg via INTRAVENOUS
  Filled 2019-09-25 (×3): qty 1

## 2019-09-25 MED ORDER — ENOXAPARIN SODIUM 40 MG/0.4ML ~~LOC~~ SOLN
40.0000 mg | SUBCUTANEOUS | Status: DC
  Administered 2019-09-25: 40 mg via SUBCUTANEOUS
  Filled 2019-09-25: qty 0.4

## 2019-09-25 MED ORDER — URSODIOL 300 MG PO CAPS
900.0000 mg | ORAL_CAPSULE | ORAL | Status: DC
  Filled 2019-09-25: qty 3

## 2019-09-25 MED ORDER — IRBESARTAN 150 MG PO TABS
300.0000 mg | ORAL_TABLET | Freq: Every evening | ORAL | Status: DC
  Administered 2019-09-25 – 2019-09-29 (×5): 300 mg via ORAL
  Filled 2019-09-25: qty 2
  Filled 2019-09-25: qty 1
  Filled 2019-09-25 (×4): qty 2
  Filled 2019-09-25: qty 1
  Filled 2019-09-25: qty 2

## 2019-09-25 MED ORDER — FLUTICASONE PROPIONATE 50 MCG/ACT NA SUSP
1.0000 | Freq: Every day | NASAL | Status: DC
  Filled 2019-09-25: qty 16

## 2019-09-25 MED ORDER — ACETAMINOPHEN 325 MG PO TABS
650.0000 mg | ORAL_TABLET | ORAL | Status: DC | PRN
  Administered 2019-09-26 – 2019-09-27 (×2): 650 mg via ORAL
  Filled 2019-09-25 (×2): qty 2

## 2019-09-25 MED ORDER — MESALAMINE 1.2 G PO TBEC
2.4000 g | DELAYED_RELEASE_TABLET | Freq: Every day | ORAL | Status: DC
  Administered 2019-09-26 – 2019-09-29 (×3): 2.4 g via ORAL
  Filled 2019-09-25 (×4): qty 2

## 2019-09-25 MED ORDER — OXYCODONE HCL 5 MG PO TABS
5.0000 mg | ORAL_TABLET | ORAL | Status: DC | PRN
  Administered 2019-09-25 – 2019-09-26 (×6): 5 mg via ORAL
  Filled 2019-09-25 (×6): qty 1

## 2019-09-25 MED ORDER — URSODIOL 300 MG PO CAPS
600.0000 mg | ORAL_CAPSULE | ORAL | Status: DC
  Administered 2019-09-25: 600 mg via ORAL
  Filled 2019-09-25: qty 2

## 2019-09-25 MED ORDER — ONDANSETRON HCL 4 MG/2ML IJ SOLN
4.0000 mg | Freq: Four times a day (QID) | INTRAMUSCULAR | Status: DC | PRN
  Administered 2019-09-25: 4 mg via INTRAVENOUS
  Filled 2019-09-25: qty 2

## 2019-09-25 MED ORDER — PROSIGHT PO TABS
1.0000 | ORAL_TABLET | Freq: Two times a day (BID) | ORAL | Status: DC
  Administered 2019-09-25 – 2019-09-29 (×7): 1 via ORAL
  Filled 2019-09-25 (×7): qty 1

## 2019-09-25 MED ORDER — POLYETHYLENE GLYCOL 3350 17 G PO PACK: 17.0000 g | PACK | Freq: Every day | ORAL | Status: DC | PRN

## 2019-09-25 MED ORDER — FENTANYL CITRATE (PF) 100 MCG/2ML IJ SOLN
25.0000 ug | Freq: Once | INTRAMUSCULAR | Status: AC
  Administered 2019-09-25: 25 ug via INTRAVENOUS
  Filled 2019-09-25: qty 2

## 2019-09-25 MED ORDER — URSODIOL 300 MG PO CAPS: 600.0000 mg | ORAL_CAPSULE | Freq: Every day | ORAL | Status: DC

## 2019-09-25 MED ORDER — SODIUM CHLORIDE 0.9% FLUSH
3.0000 mL | Freq: Two times a day (BID) | INTRAVENOUS | Status: DC
  Administered 2019-09-25 – 2019-09-29 (×4): 3 mL via INTRAVENOUS

## 2019-09-25 MED ORDER — TETANUS-DIPHTH-ACELL PERTUSSIS 5-2.5-18.5 LF-MCG/0.5 IM SUSP
0.5000 mL | Freq: Once | INTRAMUSCULAR | Status: AC
  Administered 2019-09-25: 0.5 mL via INTRAMUSCULAR
  Filled 2019-09-25: qty 0.5

## 2019-09-25 NOTE — ED Provider Notes (Signed)
[facility] DEPT Provider Note   CSN: [id number] Arrival date & time: 09/25/19  1018     History Chief Complaint  Patient presents with  . Heidi [name] is a 85 y.o. female with past medical history of hypertension, ulcerative colitis, cecal cancer, who presents today for evaluation after a fall.  She states that she was walking her dog in the park and was paying attention to the dog not where her feet were and fell.  She reports this was a nonsyncopal fall.  She reports pain in her face primarily on her upper lip and feeling like a tooth is slightly in a different position.  Additionally she reports pain in her left wrist and right knee.  She denies any interventions prior to arrival.  She does not take any blood thinning medications.  She states that her tetanus is up-to-date according to her doctor however she does not know exactly when it was.  She denies any prodromal symptoms.  She denies any headache or vision changes.  No pain in her chest.  No new back pain.  No abdominal pain.  No pain in her left leg or right arm.  HPI     Past Medical History:  Diagnosis Date  . Allergy   . Arthritis   . Basal cell carcinoma of forehead ~ 2008  . Cataract   . Chronic lower back pain   . Cirrhosis, primary biliary   . Diverticulosis of colon   . Dysrhythmia   . GERD (gastroesophageal reflux disease)   . Hard of hearing    bilat   . Heart murmur   . High cholesterol   . History of hiatal hernia   . Hx of colon cancer, stage I   . Hypertension   . Macular degeneration   . Multiple falls   . Osteoporosis    osteopenia  . Raynaud disease    hands   . Ulcerative colitis   . Venous insufficiency    lower legs    Patient Active Problem List   Diagnosis Date Noted  . Distal radius fracture, left 09/25/2019  . Back pain 11/16/2017  . Cecal cancer ([place]) 09/10/2012  . Colonic mass 09/01/2012  . Stricture and stenosis of esophagus  05/27/2011  . Hemorrhage of rectum and anus 05/27/2011  . Esophageal reflux 07/31/2010  . Biliary cirrhosis ([place]) 06/07/2008  . Essential hypertension 08/23/2007  . ARTHRITIS 08/23/2007  . CATARACT, LEFT EYE 11/27/2005  . Ulcerative colitis ([place]) 08/29/2005  . DIVERTICULOSIS, COLON 02/05/2001    Past Surgical History:  Procedure Laterality Date  . APPENDECTOMY    . CATARACT EXTRACTION W/ INTRAOCULAR LENS  IMPLANT, BILATERAL Bilateral ~ 2008  . COLONOSCOPY    . KNEE ARTHROSCOPY Right 1990's  . KNEE ARTHROSCOPY Left 08/23/2015   Procedure: ARTHROSCOPY KNEE PARTIAL MEDIAL AND LATERAL MENISCECTOMY AND DEBRIDMENT;  Surgeon: Susa Day, MD;  Location: [facility];  Service: Orthopedics;  Laterality: Left;  . LAPAROSCOPIC PARTIAL COLECTOMY N/A 09/15/2012   Procedure: LAPAROSCOPIC ILEOCOLECTOMY;  Surgeon: Stark Klein, MD;  Location: [facility];  Service: General;  Laterality: N/A;  . LAPAROSCOPIC RIGHT COLON RESECTION  09/15/2012  . MOUTH SURGERY    . POLYPECTOMY    . TONSILLECTOMY  1952  . TUBAL LIGATION  1973     OB History   No obstetric history on file.     Family History  Problem Relation Age of Onset  . Heart disease Mother   .  Heart disease Brother   . Stroke Father   . Lung cancer Brother   . Breast cancer Paternal Aunt   . Ovarian cancer Paternal Grandmother   . Colon cancer Neg Hx   . Esophageal cancer Neg Hx   . Stomach cancer Neg Hx   . Pancreatic cancer Neg Hx   . Rectal cancer Neg Hx     Social History   Tobacco Use  . Smoking status: Former Smoker    Packs/day: 0.50    Years: 40.00    Pack years: 20.00    Types: Cigarettes    Quit date: 01/16/2012    Years since quitting: 7.6  . Smokeless tobacco: Never Used  Substance Use Topics  . Alcohol use: Yes    Alcohol/week: 10.0 standard drinks    Types: 10 Glasses of wine per week  . Drug use: No    Home Medications Prior to Admission medications   Medication Sig Start Date End Date Taking? Authorizing  Provider  cetirizine (ZYRTEC) 10 MG tablet Take 10 mg by mouth daily.   Yes [provider]  Cholecalciferol (VITAMIN D PO) Take 1 tablet by mouth daily.   Yes [provider]  Cyanocobalamin (VITAMIN B 12 PO) Take 500 mg by mouth daily.   Yes [provider]  ezetimibe (ZETIA) 10 MG tablet Take 5 mg by mouth 2 (two) times a week. Wednesday and Sunday.   Yes [provider]  fluticasone (FLONASE) 50 MCG/ACT nasal spray  01/19/18  Yes [provider]  ibandronate (BONIVA) 150 MG tablet Take 150 mg by mouth every 30 (thirty) days. 09/19/18  Yes [provider]  irbesartan (AVAPRO) 300 MG tablet Take 300 mg by mouth every evening. 09/14/19  Yes [provider]  mesalamine (LIALDA) 1.2 g EC tablet TAKE TWO TABLETS EVERY DAY WITH BREAKFAST Patient taking differently: Take 2.4 g by mouth daily with breakfast.  04/22/19  Yes Armbruster, Carlota Raspberry, MD  Multiple Vitamins-Minerals (PRESERVISION AREDS PO) Take 1 tablet by mouth 2 (two) times daily.   Yes [provider]  omeprazole (PRILOSEC) 20 MG capsule TAKE ONE CAPSULE EACH DAY Patient taking differently: Take 20 mg by mouth daily.  06/02/14  Yes Inda Castle, MD  Pitavastatin Calcium (LIVALO) 4 MG TABS Take 0.5 tablets by mouth 2 (two) times a week. Wednesday and Sunday   Yes [provider]  ursodiol (ACTIGALL) 300 MG capsule You are past due for an Office visit with Dr. Havery Moros. Last seen in 2019. Please call and schedule as soon as possible. Thank you Patient taking differently: Take 600-900 mg by mouth daily. Take two capsules by mouth daily, alternating with three capsules daily. 09/12/19  Yes Armbruster, Carlota Raspberry, MD    Allergies    Codeine and Penicillins  Review of Systems   Review of Systems  Constitutional: Negative for chills and fever.  Respiratory: Negative for chest tightness and shortness of breath.   Cardiovascular: Negative for chest pain.    Gastrointestinal: Negative for abdominal pain.  Musculoskeletal: Positive for back pain and joint swelling.  Skin: Positive for wound.  Neurological: Negative for weakness, light-headedness and headaches.  Psychiatric/Behavioral: Negative for confusion.  All other systems reviewed and are negative.   Physical Exam Updated Vital Signs BP (!) 166/82 (BP Location: Left Arm)   Pulse 74   Temp 98.2 F (36.8 C) (Oral)   Resp 15   Ht 5' 2"  (1.575 m)   Wt 60.3 kg   SpO2  99%   BMI 24.33 kg/m   Physical Exam Vitals and nursing note reviewed.  Constitutional:      General: She is not in acute distress.    Appearance: She is well-developed. She is not diaphoretic.  HENT:     Head: Normocephalic.     Comments: Abrasion (superficial) on nose.  The upper lip has a laceration near midline.  Does not cross vermilion border.  Patient is able to open and close jaw with out pain.  No racoon eyes or battle signs bilaterally.  Eyes:     General: No scleral icterus.       Right eye: No discharge.        Left eye: No discharge.     Conjunctiva/sclera: Conjunctivae normal.  Cardiovascular:     Rate and Rhythm: Normal rate and regular rhythm.     Pulses: Normal pulses.  Pulmonary:     Effort: Pulmonary effort is normal. No respiratory distress.     Breath sounds: No stridor.  Abdominal:     General: There is no distension.  Musculoskeletal:        General: No deformity.     Cervical back: Normal range of motion.     Comments: Obvious edema on the left wrist.  LUE with pain primarily on ulnar aspect of left wrist and minimal pain in the forearm and left hand.   Right knee has anterior swelling, ROM very limited secondary to pain.  No pain with ROM of right ankle or hip.   Skin:    General: Skin is warm and dry.     Comments: Superficial abrasion present over right anterior knee.   Neurological:     Mental Status: She is alert and oriented to person, place, and time.     Motor: No weakness  or abnormal muscle tone.  Psychiatric:        Behavior: Behavior normal.     ED Results / Procedures / Treatments   Labs (all labs ordered are listed, but only abnormal results are displayed) Labs Reviewed  BASIC METABOLIC PANEL - Abnormal; Notable for the following components:      Result Value   Glucose, Bld 100 (*)    Calcium 8.7 (*)    All other components within normal limits  SARS CORONAVIRUS 2 BY RT PCR (HOSPITAL ORDER, [facility])  CBC WITH DIFFERENTIAL/PLATELET    EKG None  Radiology DG Forearm Left  Result Date: 09/25/2019 CLINICAL DATA:  Fall, pain EXAM: LEFT FOREARM - 2 VIEW; LEFT HAND - COMPLETE 3+ VIEW; LEFT WRIST - COMPLETE 3+ VIEW COMPARISON:  None. FINDINGS: There are angulated intra-articular fractures of the distal left radius. The proximal radius and ulna are intact. The carpus proper is normally aligned. No fracture or dislocation of the left hand. There is severe arthrosis of the left thumb basal joint with otherwise mild osteoarthritic change. Soft tissues are unremarkable. IMPRESSION: 1. Angulated intra-articular fractures of the distal left radius. The proximal radius and ulna are intact. 2. The carpus proper is normally aligned. 3. No fracture or dislocation of the left hand. Severe arthrosis of the left thumb basal joint. Electronically Signed   By: Eddie Candle M.D.   On: 09/25/2019 12:27   DG Wrist Complete Left  Result Date: 09/25/2019 CLINICAL DATA:  Fall, pain EXAM: LEFT FOREARM - 2 VIEW; LEFT HAND - COMPLETE 3+ VIEW; LEFT WRIST - COMPLETE 3+ VIEW COMPARISON:  None. FINDINGS: There are angulated intra-articular fractures of  the distal left radius. The proximal radius and ulna are intact. The carpus proper is normally aligned. No fracture or dislocation of the left hand. There is severe arthrosis of the left thumb basal joint with otherwise mild osteoarthritic change. Soft tissues are unremarkable. IMPRESSION: 1. Angulated  intra-articular fractures of the distal left radius. The proximal radius and ulna are intact. 2. The carpus proper is normally aligned. 3. No fracture or dislocation of the left hand. Severe arthrosis of the left thumb basal joint. Electronically Signed   By: Eddie Candle M.D.   On: 09/25/2019 12:27   CT Head Wo Contrast  Result Date: 09/25/2019 CLINICAL DATA:  Fall, facial trauma, laceration to upper lip EXAM: CT HEAD WITHOUT CONTRAST CT MAXILLOFACIAL WITHOUT CONTRAST CT CERVICAL SPINE WITHOUT CONTRAST TECHNIQUE: Multidetector CT imaging of the head, cervical spine, and maxillofacial structures were performed using the standard protocol without intravenous contrast. Multiplanar CT image reconstructions of the cervical spine and maxillofacial structures were also generated. COMPARISON:  None. FINDINGS: CT HEAD FINDINGS Brain: No evidence of acute infarction, hemorrhage, hydrocephalus, extra-axial collection or mass lesion/mass effect. Periventricular white matter hypodensity. Vascular: No hyperdense vessel or unexpected calcification. CT FACIAL BONES FINDINGS Skull: Normal. Negative for fracture or focal lesion. Facial bones: No displaced fractures or dislocations. Sinuses/Orbits: No acute finding. Other: None. CT CERVICAL SPINE FINDINGS Alignment: Normal. Skull base and vertebrae: No acute fracture. No primary bone lesion or focal pathologic process. Soft tissues and spinal canal: No prevertebral fluid or swelling. No visible canal hematoma. Disc levels: Mild multilevel disc space height loss and osteophytosis. Upper chest: Negative. Other: None. IMPRESSION: 1. No acute intracranial pathology. Small-vessel white matter disease. 2. No displaced fractures or dislocations of the facial bones. 3. No fracture or static subluxation of the cervical spine. 4. Mild multilevel degenerative disc disease of the cervical spine. Electronically Signed   By: Eddie Candle M.D.   On: 09/25/2019 12:33   CT Cervical Spine Wo  Contrast  Result Date: 09/25/2019 CLINICAL DATA:  Fall, facial trauma, laceration to upper lip EXAM: CT HEAD WITHOUT CONTRAST CT MAXILLOFACIAL WITHOUT CONTRAST CT CERVICAL SPINE WITHOUT CONTRAST TECHNIQUE: Multidetector CT imaging of the head, cervical spine, and maxillofacial structures were performed using the standard protocol without intravenous contrast. Multiplanar CT image reconstructions of the cervical spine and maxillofacial structures were also generated. COMPARISON:  None. FINDINGS: CT HEAD FINDINGS Brain: No evidence of acute infarction, hemorrhage, hydrocephalus, extra-axial collection or mass lesion/mass effect. Periventricular white matter hypodensity. Vascular: No hyperdense vessel or unexpected calcification. CT FACIAL BONES FINDINGS Skull: Normal. Negative for fracture or focal lesion. Facial bones: No displaced fractures or dislocations. Sinuses/Orbits: No acute finding. Other: None. CT CERVICAL SPINE FINDINGS Alignment: Normal. Skull base and vertebrae: No acute fracture. No primary bone lesion or focal pathologic process. Soft tissues and spinal canal: No prevertebral fluid or swelling. No visible canal hematoma. Disc levels: Mild multilevel disc space height loss and osteophytosis. Upper chest: Negative. Other: None. IMPRESSION: 1. No acute intracranial pathology. Small-vessel white matter disease. 2. No displaced fractures or dislocations of the facial bones. 3. No fracture or static subluxation of the cervical spine. 4. Mild multilevel degenerative disc disease of the cervical spine. Electronically Signed   By: Eddie Candle M.D.   On: 09/25/2019 12:33   DG Knee Right Port  Result Date: 09/25/2019 CLINICAL DATA:  Right knee pain post fall. EXAM: PORTABLE RIGHT KNEE - 1-2 VIEW COMPARISON:  None. FINDINGS: There is a mildly comminuted transverse patellar fracture. There  is an associated hemorrhagic suprapatellar joint effusion. No other fractures are identified. Mild osteoarthritic  changes of the medial and lateral compartments of the right knee. Four line prepatellar soft tissue swelling. IMPRESSION: Mildly comminuted transverse right patellar fracture with associated hemorrhagic suprapatellar joint effusion. Electronically Signed   By: Fidela Salisbury M.D.   On: 09/25/2019 12:25   DG Hand Complete Left  Result Date: 09/25/2019 CLINICAL DATA:  Fall, pain EXAM: LEFT FOREARM - 2 VIEW; LEFT HAND - COMPLETE 3+ VIEW; LEFT WRIST - COMPLETE 3+ VIEW COMPARISON:  None. FINDINGS: There are angulated intra-articular fractures of the distal left radius. The proximal radius and ulna are intact. The carpus proper is normally aligned. No fracture or dislocation of the left hand. There is severe arthrosis of the left thumb basal joint with otherwise mild osteoarthritic change. Soft tissues are unremarkable. IMPRESSION: 1. Angulated intra-articular fractures of the distal left radius. The proximal radius and ulna are intact. 2. The carpus proper is normally aligned. 3. No fracture or dislocation of the left hand. Severe arthrosis of the left thumb basal joint. Electronically Signed   By: Eddie Candle M.D.   On: 09/25/2019 12:27   CT Maxillofacial WO CM  Result Date: 09/25/2019 CLINICAL DATA:  Fall, facial trauma, laceration to upper lip EXAM: CT HEAD WITHOUT CONTRAST CT MAXILLOFACIAL WITHOUT CONTRAST CT CERVICAL SPINE WITHOUT CONTRAST TECHNIQUE: Multidetector CT imaging of the head, cervical spine, and maxillofacial structures were performed using the standard protocol without intravenous contrast. Multiplanar CT image reconstructions of the cervical spine and maxillofacial structures were also generated. COMPARISON:  None. FINDINGS: CT HEAD FINDINGS Brain: No evidence of acute infarction, hemorrhage, hydrocephalus, extra-axial collection or mass lesion/mass effect. Periventricular white matter hypodensity. Vascular: No hyperdense vessel or unexpected calcification. CT FACIAL BONES FINDINGS Skull:  Normal. Negative for fracture or focal lesion. Facial bones: No displaced fractures or dislocations. Sinuses/Orbits: No acute finding. Other: None. CT CERVICAL SPINE FINDINGS Alignment: Normal. Skull base and vertebrae: No acute fracture. No primary bone lesion or focal pathologic process. Soft tissues and spinal canal: No prevertebral fluid or swelling. No visible canal hematoma. Disc levels: Mild multilevel disc space height loss and osteophytosis. Upper chest: Negative. Other: None. IMPRESSION: 1. No acute intracranial pathology. Small-vessel white matter disease. 2. No displaced fractures or dislocations of the facial bones. 3. No fracture or static subluxation of the cervical spine. 4. Mild multilevel degenerative disc disease of the cervical spine. Electronically Signed   By: Eddie Candle M.D.   On: 09/25/2019 12:33    Procedures .Marland KitchenLaceration Repair  Date/Time: 09/25/2019 9:11 PM Performed by: Lorin Glass, PA-C Authorized by: Lorin Glass, PA-C   Consent:    Consent obtained:  Verbal   Consent given by:  Patient   Risks discussed:  Infection, need for additional repair, poor cosmetic result, pain, retained foreign body, tendon damage, vascular damage, poor wound healing and nerve damage   Alternatives discussed:  No treatment and referral (Alternative wound closures) Anesthesia (see MAR for exact dosages):    Anesthesia method:  Local infiltration   Local anesthetic:  Lidocaine 2% WITH epi Laceration details:    Location:  Lip   Lip location:  Upper interior lip   Length (cm):  1.5 Repair type:    Repair type:  Intermediate Pre-procedure details:    Preparation:  Imaging obtained to evaluate for foreign bodies Exploration:    Hemostasis achieved with:  Epinephrine and direct pressure   Wound exploration: wound explored through full range  of motion and entire depth of wound probed and visualized     Wound extent: no foreign bodies/material noted, no underlying  fracture noted and no vascular damage noted     Contaminated: yes   Treatment:    Area cleansed with:  Saline and Betadine Skin repair:    Repair method:  Sutures   Suture size:  5-0   Suture material:  Fast-absorbing gut   Suture technique:  Simple interrupted   Number of sutures:  3 Approximation:    Approximation:  Close   Vermilion border well-aligned: did not cross vermilion border.   Post-procedure details:    Dressing:  Open (no dressing)   Patient tolerance of procedure:  Tolerated well, no immediate complications   (including critical care time)  Medications Ordered in ED Medications  lidocaine-EPINEPHrine (XYLOCAINE W/EPI) 2 %-1:200000 (PF) injection 10 mL (10 mLs Infiltration Given 09/25/19 1338)  Tdap (BOOSTRIX) injection 0.5 mL (0.5 mLs Intramuscular Given 09/25/19 1438)  fentaNYL (SUBLIMAZE) injection 25 mcg (25 mcg Intravenous Given 09/25/19 1436)    ED Course  I have reviewed the triage vital signs and the nursing notes.  Pertinent labs & imaging results that were available during my care of the patient were reviewed by me and considered in my medical decision making (see chart for details).  Clinical Course as of Sep 25 1638  Sun Sep 25, 6763  843 85 year old female mechanical fall while out walking the dog striking her face on the concrete along with injuries to wrists and knees.  Getting x-rays and CAT scans.  Will need laceration repair of inner upper lip.  Disposition per results of testing   [MB]  1503 I spoke with PA or [facility] who requests ace wrap and knee imobilizer. Defer wrist fracture to hand.  I spoke with Dr. Alain Marion on for hand who requests sugar tong, will see her tomorrow.    [EH]    Clinical Course User Index [EH] Lorin Glass, PA-C [MB] Hayden Rasmussen, MD   MDM Rules/Calculators/A&P                     Patient is a 85 year old woman who presents today for evaluation of a mechanical, nonsyncopal fall that occurred while  she was outside walking her dog.  On exam she has pain and swelling in the right knee, left wrist and a laceration on her upper lip.  X-rays of head, face, neck were obtained without evidence of fracture or significant acute abnormalities.  Tdap is updated.  Lip laceration was repaired after patient gave verbal consent.  I discussed with admitting Dr. Parks Ranger normally would consider placing patient on prophylactic antibiotics for a oral wound such as this.    X-rays of the left wrist were obtained showing a intra-articular distal radius fracture.  X-rays of the right knee show a comminuted patella fracture.  I consulted orthopedics.  I spoke with PA who will come to see patient for the patella fracture, request that I consult hand for the distal radius fracture.  I spoke with Dr. Percell Miller of orthopedics for the distal radius fracture, he requests a sugar tong splint be placed and that patient will be seen tomorrow.  Given that patient has fractures in both a lower extremity and of her wrist and will most likely be nonweightbearing at this time on the left upper extremity and therefore unable to use a walker or crutches to get around at home I do not feel that  it would be appropriate or safe to discharge her home at this time.  Hospitalist is consulted for admission who agreed to see the patient.  This patient was seen as a shared visit with Dr. Melina Copa.   Note: Portions of this report may have been transcribed using voice recognition software. Every effort was made to ensure accuracy; however, inadvertent computerized transcription errors may be present  Final Clinical Impression(s) / ED Diagnoses Final diagnoses:  Fall  Closed nondisplaced comminuted fracture of right patella, initial encounter  Other closed intra-articular fracture of distal end of left radius, initial encounter  Lip laceration, initial encounter    Rx / DC Orders ED Discharge Orders    None       Ollen Gross 09/25/19 2116    Hayden Rasmussen, MD 09/26/19 [id number]

## 2019-09-25 NOTE — Assessment & Plan Note (Signed)
-   see patellar and distal radius fractures - patient will need PT after surgeries and possibly ST rehab until more independent again  - may likely be able to do some simple PT in bed until completes all evals in the next couple days

## 2019-09-25 NOTE — Assessment & Plan Note (Signed)
-   followed by GI outpatient - has been well controlled on monotherapy mesalamine - continue mesalamine

## 2019-09-25 NOTE — Assessment & Plan Note (Signed)
-   followed by GI. Dx made via biopsy. Patient has been stable  - continue ursodiol

## 2019-09-25 NOTE — Plan of Care (Signed)
  Problem: Activity: Goal: Risk for activity intolerance will decrease Outcome: Progressing   Problem: Nutrition: Goal: Adequate nutrition will be maintained Outcome: Progressing   Problem: Clinical Measurements: Goal: Cardiovascular complication will be avoided Outcome: Progressing   Problem: Clinical Measurements: Goal: Respiratory complications will improve Outcome: Progressing   Problem: Elimination: Goal: Will not experience complications related to bowel motility Outcome: Progressing   Problem: Skin Integrity: Goal: Risk for impaired skin integrity will decrease Outcome: Progressing

## 2019-09-25 NOTE — ED Triage Notes (Signed)
Per patient, she was walking this morning and fell on her face. Injury noted to upper lip. Patient reports left wrist swelling, right knee pain as well.

## 2019-09-25 NOTE — H&P (Signed)
History and Physical    Heidi [name]  LXB:[id number]  DOB: [birth year]  DOA: 09/25/2019  PCP: Heidi Infante, MD Patient coming from: home  Chief Complaint: fall at dog park  HPI:  Heidi [name] is an 85 yo CF with PMH arthritis, UC (left sided, dx 14 yrs ago, treated with mesalamine), osteoporosis, HLD, HTN, macular degeneration, GERD, diverticulosis, PBC (on ursodiol) who came to the ER after suffering a fall earlier today. She was at the dog park and accidentally tripped and fell to the concrete ground she was on.  She says she was not really able to brace her fall and as she fell she hit her knees on the ground, right harder than left and then fell onto her left wrist, and then struck her face on the ground.  As she stood up she noticed immediate blood from her lip.  She was able to make it back to her car and even drive home, however by this time she could barely bear weight on her right lower extremity and her left wrist had begun swelling.  She presented to the ER for further evaluation after her fall. She underwent multiple x-rays and CTs.  She was found to have a right patellar fracture and left distal radius fracture as well as a lacerated lip.  She underwent suture repair in the ER for her lip laceration. She was placed in a wrist splint and shoulder sling for her left wrist fracture and will undergo evaluation by hand surgery on Monday. She was also evaluated by orthopedic surgery while in the ER regarding her right patellar fracture and will undergo tentative plans for surgical repair on Tuesday, plan to be determined still. She is otherwise resting in bed more comfortable after having received pain medication and assessment of her fractures.  She understands the plan and is amenable.    I have personally briefly reviewed patient's old medical records in [facility]  Assessment/Plan: Ulcerative colitis ([place]) - followed by GI outpatient - has been well controlled on  monotherapy mesalamine - continue mesalamine  Biliary cirrhosis ([place]) - followed by GI. Dx made via biopsy. Patient has been stable  - continue ursodiol  Distal radius fracture, left - s/p mechanical fall at dog park - per xray: Angulated intra-articular fractures of the distal left radius - hand surgery consulted, plans to see patient on 5/24 to evaluate; wrist in splint currently  - continue supportive care and pain control for now  Patellar fracture - s/p mechanical fall - per xray: "Mildly comminuted transverse right patellar fracture with associated hemorrhagic suprapatellar joint effusion." - evaluated by orthopedic surgery in ER on 5/23. Tentative plan for repair on 5/25 (ORIF vs patellectomy) - per ortho: partial weightbearing on RLE and gentle ROM in ankle - continue pain control  Multiple fractures - see patellar and distal radius fractures - patient will need PT after surgeries and possibly ST rehab until more independent again  - may likely be able to do some simple PT in bed until completes all evals in the next couple days    Code Status: Full DVT Prophylaxis:enoxaparin (Lovenox) 30m SQ 2 hours prior to surgery then every day Anticipated disposition is to: probably rehab  History: Past Medical History:  Diagnosis Date  . Allergy   . Arthritis   . Basal cell carcinoma of forehead ~ 2008  . Cataract   . Chronic lower back pain   . Cirrhosis, primary biliary   . Diverticulosis of colon   .  Dysrhythmia   . GERD (gastroesophageal reflux disease)   . Hard of hearing    bilat   . Heart murmur   . High cholesterol   . History of hiatal hernia   . Hx of colon cancer, stage I   . Hypertension   . Macular degeneration   . Multiple falls   . Osteoporosis    osteopenia  . Raynaud disease    hands   . Ulcerative colitis   . Venous insufficiency    lower legs    Past Surgical History:  Procedure Laterality Date  . APPENDECTOMY    . CATARACT EXTRACTION  W/ INTRAOCULAR LENS  IMPLANT, BILATERAL Bilateral ~ 2008  . COLONOSCOPY    . KNEE ARTHROSCOPY Right 1990's  . KNEE ARTHROSCOPY Left 08/23/2015   Procedure: ARTHROSCOPY KNEE PARTIAL MEDIAL AND LATERAL MENISCECTOMY AND DEBRIDMENT;  Surgeon: Susa Day, MD;  Location: [facility];  Service: Orthopedics;  Laterality: Left;  . LAPAROSCOPIC PARTIAL COLECTOMY N/A 09/15/2012   Procedure: LAPAROSCOPIC ILEOCOLECTOMY;  Surgeon: Stark Klein, MD;  Location: [facility];  Service: General;  Laterality: N/A;  . LAPAROSCOPIC RIGHT COLON RESECTION  09/15/2012  . MOUTH SURGERY    . POLYPECTOMY    . TONSILLECTOMY  1952  . TUBAL LIGATION  1973     reports that she quit smoking about 7 years ago. Her smoking use included cigarettes. She has a 20.00 pack-year smoking history. She has never used smokeless tobacco. She reports current alcohol use of about 10.0 standard drinks of alcohol per week. She reports that she does not use drugs.  Allergies  Allergen Reactions  . Codeine Nausea And Vomiting  . Penicillins Hives    Family History  Problem Relation Age of Onset  . Heart disease Mother   . Heart disease Brother   . Stroke Father   . Lung cancer Brother   . Breast cancer Paternal Aunt   . Ovarian cancer Paternal Grandmother   . Colon cancer Neg Hx   . Esophageal cancer Neg Hx   . Stomach cancer Neg Hx   . Pancreatic cancer Neg Hx   . Rectal cancer Neg Hx    Home Medications: Prior to Admission medications   Medication Sig Start Date End Date Taking? Authorizing Provider  cetirizine (ZYRTEC) 10 MG tablet Take 10 mg by mouth daily.   Yes [provider]  Cholecalciferol (VITAMIN D PO) Take 1 tablet by mouth daily.   Yes [provider]  Cyanocobalamin (VITAMIN B 12 PO) Take 500 mg by mouth daily.   Yes [provider]  ezetimibe (ZETIA) 10 MG tablet Take 5 mg by mouth 2 (two) times a week. Wednesday and Sunday.   Yes [provider]  fluticasone (FLONASE) 50 MCG/ACT  nasal spray  01/19/18  Yes [provider]  ibandronate (BONIVA) 150 MG tablet Take 150 mg by mouth every 30 (thirty) days. 09/19/18  Yes [provider]  irbesartan (AVAPRO) 300 MG tablet Take 300 mg by mouth every evening. 09/14/19  Yes [provider]  mesalamine (LIALDA) 1.2 g EC tablet TAKE TWO TABLETS EVERY DAY WITH BREAKFAST Patient taking differently: Take 2.4 g by mouth daily with breakfast.  04/22/19  Yes Armbruster, Carlota Raspberry, MD  Multiple Vitamins-Minerals (PRESERVISION AREDS PO) Take 1 tablet by mouth 2 (two) times daily.   Yes [provider]  omeprazole (PRILOSEC) 20 MG capsule TAKE ONE CAPSULE EACH DAY Patient taking differently: Take 20 mg by mouth daily.  06/02/14  Yes  Inda Castle, MD  Pitavastatin Calcium (LIVALO) 4 MG TABS Take 0.5 tablets by mouth 2 (two) times a week. Wednesday and Sunday   Yes [provider]  ursodiol (ACTIGALL) 300 MG capsule You are past due for an Office visit with Dr. Havery Moros. Last seen in 2019. Please call and schedule as soon as possible. Thank you Patient taking differently: Take 600-900 mg by mouth daily. Take two capsules by mouth daily, alternating with three capsules daily. 09/12/19  Yes Armbruster, Carlota Raspberry, MD    Review of Systems:  Pertinent items noted in HPI and remainder of comprehensive ROS otherwise negative.  Physical Exam: Vitals:   09/25/19 1042 09/25/19 1047 09/25/19 1050 09/25/19 1339  BP:  (!) 178/97 (!) 162/98 (!) 166/82  Pulse:  67  74  Resp:  18  15  Temp:  98.2 F (36.8 C)    TempSrc:  Oral    SpO2:  98%  99%  Weight: 60.3 kg     Height: 5' 2"  (1.575 m)      General appearance: alert, cooperative and no distress Head: lip laceration noted with sutures in place and no further bleeding Eyes: EOMI Lungs: clear to auscultation bilaterally Heart: regular rate and rhythm and S1, S2 normal Abdomen: normal findings: bowel sounds normal and soft, non-tender Extremities:  multiple toe deformities noted; RLE in immobilizer and LUE in wrist splint and shoulder sling Skin: mobility and turgor normal Neurologic: Grossly normal  Labs on Admission:  I have personally reviewed following labs and imaging studies Results for orders placed or performed during the hospital encounter of 09/25/19 (from the past 24 hour(s))  SARS Coronavirus 2 by RT PCR (hospital order, performed in [place] hospital lab) Nasopharyngeal Nasopharyngeal Swab     Status: None   Collection Time: 09/25/19  1:47 PM   Specimen: Nasopharyngeal Swab  Result Value Ref Range   SARS Coronavirus 2 NEGATIVE NEGATIVE  Basic metabolic panel     Status: Abnormal   Collection Time: 09/25/19  1:47 PM  Result Value Ref Range   Sodium 139 135 - 145 mmol/L   Potassium 4.2 3.5 - 5.1 mmol/L   Chloride 108 98 - 111 mmol/L   CO2 24 22 - 32 mmol/L   Glucose, Bld 100 (H) 70 - 99 mg/dL   BUN 17 8 - 23 mg/dL   Creatinine, Ser 0.71 0.44 - 1.00 mg/dL   Calcium 8.7 (L) 8.9 - 10.3 mg/dL   GFR calc non Af Amer >60 >60 mL/min   GFR calc Af Amer >60 >60 mL/min   Anion gap 7 5 - 15  CBC with Differential     Status: None   Collection Time: 09/25/19  1:47 PM  Result Value Ref Range   WBC 8.4 4.0 - 10.5 K/uL   RBC 4.38 3.87 - 5.11 MIL/uL   Hemoglobin 14.1 12.0 - 15.0 g/dL   HCT 43.0 36.0 - 46.0 %   MCV 98.2 80.0 - 100.0 fL   MCH 32.2 26.0 - 34.0 pg   MCHC 32.8 30.0 - 36.0 g/dL   RDW 13.5 11.5 - 15.5 %   Platelets 223 150 - 400 K/uL   nRBC 0.0 0.0 - 0.2 %   Neutrophils Relative % 79 %   Neutro Abs 6.8 1.7 - 7.7 K/uL   Lymphocytes Relative 12 %   Lymphs Abs 1.0 0.7 - 4.0 K/uL   Monocytes Relative 6 %   Monocytes Absolute 0.5 0.1 - 1.0 K/uL   Eosinophils  Relative 1 %   Eosinophils Absolute 0.1 0.0 - 0.5 K/uL   Basophils Relative 1 %   Basophils Absolute 0.0 0.0 - 0.1 K/uL   Immature Granulocytes 1 %   Abs Immature Granulocytes 0.04 0.00 - 0.07 K/uL     Radiological Exams on Admission: DG Forearm  Left  Result Date: 09/25/2019 CLINICAL DATA:  Fall, pain EXAM: LEFT FOREARM - 2 VIEW; LEFT HAND - COMPLETE 3+ VIEW; LEFT WRIST - COMPLETE 3+ VIEW COMPARISON:  None. FINDINGS: There are angulated intra-articular fractures of the distal left radius. The proximal radius and ulna are intact. The carpus proper is normally aligned. No fracture or dislocation of the left hand. There is severe arthrosis of the left thumb basal joint with otherwise mild osteoarthritic change. Soft tissues are unremarkable. IMPRESSION: 1. Angulated intra-articular fractures of the distal left radius. The proximal radius and ulna are intact. 2. The carpus proper is normally aligned. 3. No fracture or dislocation of the left hand. Severe arthrosis of the left thumb basal joint. Electronically Signed   By: Eddie Candle M.D.   On: 09/25/2019 12:27   DG Wrist Complete Left  Result Date: 09/25/2019 CLINICAL DATA:  Fall, pain EXAM: LEFT FOREARM - 2 VIEW; LEFT HAND - COMPLETE 3+ VIEW; LEFT WRIST - COMPLETE 3+ VIEW COMPARISON:  None. FINDINGS: There are angulated intra-articular fractures of the distal left radius. The proximal radius and ulna are intact. The carpus proper is normally aligned. No fracture or dislocation of the left hand. There is severe arthrosis of the left thumb basal joint with otherwise mild osteoarthritic change. Soft tissues are unremarkable. IMPRESSION: 1. Angulated intra-articular fractures of the distal left radius. The proximal radius and ulna are intact. 2. The carpus proper is normally aligned. 3. No fracture or dislocation of the left hand. Severe arthrosis of the left thumb basal joint. Electronically Signed   By: Eddie Candle M.D.   On: 09/25/2019 12:27   CT Head Wo Contrast  Result Date: 09/25/2019 CLINICAL DATA:  Fall, facial trauma, laceration to upper lip EXAM: CT HEAD WITHOUT CONTRAST CT MAXILLOFACIAL WITHOUT CONTRAST CT CERVICAL SPINE WITHOUT CONTRAST TECHNIQUE: Multidetector CT imaging of the head,  cervical spine, and maxillofacial structures were performed using the standard protocol without intravenous contrast. Multiplanar CT image reconstructions of the cervical spine and maxillofacial structures were also generated. COMPARISON:  None. FINDINGS: CT HEAD FINDINGS Brain: No evidence of acute infarction, hemorrhage, hydrocephalus, extra-axial collection or mass lesion/mass effect. Periventricular white matter hypodensity. Vascular: No hyperdense vessel or unexpected calcification. CT FACIAL BONES FINDINGS Skull: Normal. Negative for fracture or focal lesion. Facial bones: No displaced fractures or dislocations. Sinuses/Orbits: No acute finding. Other: None. CT CERVICAL SPINE FINDINGS Alignment: Normal. Skull base and vertebrae: No acute fracture. No primary bone lesion or focal pathologic process. Soft tissues and spinal canal: No prevertebral fluid or swelling. No visible canal hematoma. Disc levels: Mild multilevel disc space height loss and osteophytosis. Upper chest: Negative. Other: None. IMPRESSION: 1. No acute intracranial pathology. Small-vessel white matter disease. 2. No displaced fractures or dislocations of the facial bones. 3. No fracture or static subluxation of the cervical spine. 4. Mild multilevel degenerative disc disease of the cervical spine. Electronically Signed   By: Eddie Candle M.D.   On: 09/25/2019 12:33   CT Cervical Spine Wo Contrast  Result Date: 09/25/2019 CLINICAL DATA:  Fall, facial trauma, laceration to upper lip EXAM: CT HEAD WITHOUT CONTRAST CT MAXILLOFACIAL WITHOUT CONTRAST CT CERVICAL SPINE WITHOUT CONTRAST TECHNIQUE:  Multidetector CT imaging of the head, cervical spine, and maxillofacial structures were performed using the standard protocol without intravenous contrast. Multiplanar CT image reconstructions of the cervical spine and maxillofacial structures were also generated. COMPARISON:  None. FINDINGS: CT HEAD FINDINGS Brain: No evidence of acute infarction,  hemorrhage, hydrocephalus, extra-axial collection or mass lesion/mass effect. Periventricular white matter hypodensity. Vascular: No hyperdense vessel or unexpected calcification. CT FACIAL BONES FINDINGS Skull: Normal. Negative for fracture or focal lesion. Facial bones: No displaced fractures or dislocations. Sinuses/Orbits: No acute finding. Other: None. CT CERVICAL SPINE FINDINGS Alignment: Normal. Skull base and vertebrae: No acute fracture. No primary bone lesion or focal pathologic process. Soft tissues and spinal canal: No prevertebral fluid or swelling. No visible canal hematoma. Disc levels: Mild multilevel disc space height loss and osteophytosis. Upper chest: Negative. Other: None. IMPRESSION: 1. No acute intracranial pathology. Small-vessel white matter disease. 2. No displaced fractures or dislocations of the facial bones. 3. No fracture or static subluxation of the cervical spine. 4. Mild multilevel degenerative disc disease of the cervical spine. Electronically Signed   By: Eddie Candle M.D.   On: 09/25/2019 12:33   DG Knee Right Port  Result Date: 09/25/2019 CLINICAL DATA:  Right knee pain post fall. EXAM: PORTABLE RIGHT KNEE - 1-2 VIEW COMPARISON:  None. FINDINGS: There is a mildly comminuted transverse patellar fracture. There is an associated hemorrhagic suprapatellar joint effusion. No other fractures are identified. Mild osteoarthritic changes of the medial and lateral compartments of the right knee. Four line prepatellar soft tissue swelling. IMPRESSION: Mildly comminuted transverse right patellar fracture with associated hemorrhagic suprapatellar joint effusion. Electronically Signed   By: Fidela Salisbury M.D.   On: 09/25/2019 12:25   DG Hand Complete Left  Result Date: 09/25/2019 CLINICAL DATA:  Fall, pain EXAM: LEFT FOREARM - 2 VIEW; LEFT HAND - COMPLETE 3+ VIEW; LEFT WRIST - COMPLETE 3+ VIEW COMPARISON:  None. FINDINGS: There are angulated intra-articular fractures of the  distal left radius. The proximal radius and ulna are intact. The carpus proper is normally aligned. No fracture or dislocation of the left hand. There is severe arthrosis of the left thumb basal joint with otherwise mild osteoarthritic change. Soft tissues are unremarkable. IMPRESSION: 1. Angulated intra-articular fractures of the distal left radius. The proximal radius and ulna are intact. 2. The carpus proper is normally aligned. 3. No fracture or dislocation of the left hand. Severe arthrosis of the left thumb basal joint. Electronically Signed   By: Eddie Candle M.D.   On: 09/25/2019 12:27   CT Maxillofacial WO CM  Result Date: 09/25/2019 CLINICAL DATA:  Fall, facial trauma, laceration to upper lip EXAM: CT HEAD WITHOUT CONTRAST CT MAXILLOFACIAL WITHOUT CONTRAST CT CERVICAL SPINE WITHOUT CONTRAST TECHNIQUE: Multidetector CT imaging of the head, cervical spine, and maxillofacial structures were performed using the standard protocol without intravenous contrast. Multiplanar CT image reconstructions of the cervical spine and maxillofacial structures were also generated. COMPARISON:  None. FINDINGS: CT HEAD FINDINGS Brain: No evidence of acute infarction, hemorrhage, hydrocephalus, extra-axial collection or mass lesion/mass effect. Periventricular white matter hypodensity. Vascular: No hyperdense vessel or unexpected calcification. CT FACIAL BONES FINDINGS Skull: Normal. Negative for fracture or focal lesion. Facial bones: No displaced fractures or dislocations. Sinuses/Orbits: No acute finding. Other: None. CT CERVICAL SPINE FINDINGS Alignment: Normal. Skull base and vertebrae: No acute fracture. No primary bone lesion or focal pathologic process. Soft tissues and spinal canal: No prevertebral fluid or swelling. No visible canal hematoma. Disc levels: Mild multilevel  disc space height loss and osteophytosis. Upper chest: Negative. Other: None. IMPRESSION: 1. No acute intracranial pathology. Small-vessel white  matter disease. 2. No displaced fractures or dislocations of the facial bones. 3. No fracture or static subluxation of the cervical spine. 4. Mild multilevel degenerative disc disease of the cervical spine. Electronically Signed   By: Eddie Candle M.D.   On: 09/25/2019 12:33   CT Head Wo Contrast  Final Result    CT Cervical Spine Wo Contrast  Final Result    CT Maxillofacial WO CM  Final Result    DG Wrist Complete Left  Final Result    DG Forearm Left  Final Result    DG Hand Complete Left  Final Result    DG Knee Right Port  Final Result      Consults called:  Ortho surgery Hand surgery     Dwyane Dee, MD [facility] Pager: Secure chat via Amion Or pager # : [phone]  If 7PM-7AM, please contact night-coverage [email or web address] Use universal [place] password for that web site. If you do not have the password, please call the hospital operator.  09/25/2019, 4:58 PM

## 2019-09-25 NOTE — Assessment & Plan Note (Signed)
-   s/p mechanical fall - per xray: "Mildly comminuted transverse right patellar fracture with associated hemorrhagic suprapatellar joint effusion." - evaluated by orthopedic surgery in ER on 5/23. Tentative plan for repair on 5/25 (ORIF vs patellectomy) - per ortho: partial weightbearing on RLE and gentle ROM in ankle - continue pain control

## 2019-09-25 NOTE — ED Notes (Signed)
Light blue and two golds sent to lab

## 2019-09-25 NOTE — Progress Notes (Signed)
Orthopedic Tech Progress Note Patient Details:  [name] 1934-10-24 [id number]  Ortho Devices Type of Ortho Device: Ace wrap, Cotton web roll, Sugartong splint, Knee Immobilizer, Long leg splint Ortho Device/Splint Location: RLE, LUE Ortho Device/Splint Interventions: Ordered, Application, Adjustment   Post Interventions Patient Tolerated: Well Instructions Provided: Care of device, Poper ambulation with device, Adjustment of device   [name] N Sala Tague 09/25/2019, 3:42 PM

## 2019-09-25 NOTE — Consult Note (Signed)
Reason for Consult: Left Knee Pain, fall Referring Physician: ED  Heidi [name] is an 85 y.o. female.  HPI: This is a very pleasant 85 year old female who was seen in the emergency department today for a right patella fracture and a left distal radius fracture.  Patient was out this morning walking her dog when she tripped and fell and landed directly on her right knee as well as caught herself with her left wrist.  Having pain in the right knee,  As well as in the left wrist.  She also has a cut on her lip.  Patient states that she has had previous history of pain in this right knee.  She has had previous cortisone injections by Dr. Maxie Better for right knee osteoarthritis.  She does not remember when she had her last cortisone injection.  Patient tells me that she is having difficulty with raising her leg up in the air.  She is unable to hold a straight leg.  She is having difficulty with ambulation.  Pain is about a 5 out of 10 in that right knee.  Past Medical History:  Diagnosis Date   Allergy    Arthritis    Basal cell carcinoma of forehead ~ 2008   Cataract    Chronic lower back pain    Cirrhosis, primary biliary    Diverticulosis of colon    Dysrhythmia    GERD (gastroesophageal reflux disease)    Hard of hearing    bilat    Heart murmur    High cholesterol    History of hiatal hernia    Hx of colon cancer, stage I    Hypertension    Macular degeneration    Multiple falls    Osteoporosis    osteopenia   Raynaud disease    hands    Ulcerative colitis    Venous insufficiency    lower legs    Past Surgical History:  Procedure Laterality Date   APPENDECTOMY     CATARACT EXTRACTION W/ INTRAOCULAR LENS  IMPLANT, BILATERAL Bilateral ~ 2008   COLONOSCOPY     KNEE ARTHROSCOPY Right 1990's   KNEE ARTHROSCOPY Left 08/23/2015   Procedure: ARTHROSCOPY KNEE PARTIAL MEDIAL AND LATERAL MENISCECTOMY AND DEBRIDMENT;  Surgeon: Susa Day, MD;  Location: [facility];   Service: Orthopedics;  Laterality: Left;   LAPAROSCOPIC PARTIAL COLECTOMY N/A 09/15/2012   Procedure: LAPAROSCOPIC ILEOCOLECTOMY;  Surgeon: Stark Klein, MD;  Location: [facility];  Service: General;  Laterality: N/A;   LAPAROSCOPIC RIGHT COLON RESECTION  09/15/2012   MOUTH SURGERY     POLYPECTOMY     TONSILLECTOMY  1952   TUBAL LIGATION  1973    Family History  Problem Relation Age of Onset   Heart disease Mother    Heart disease Brother    Stroke Father    Lung cancer Brother    Breast cancer Paternal Aunt    Ovarian cancer Paternal Grandmother    Colon cancer Neg Hx    Esophageal cancer Neg Hx    Stomach cancer Neg Hx    Pancreatic cancer Neg Hx    Rectal cancer Neg Hx     Social History:  reports that she quit smoking about 7 years ago. Her smoking use included cigarettes. She has a 20.00 pack-year smoking history. She has never used smokeless tobacco. She reports current alcohol use of about 10.0 standard drinks of alcohol per week. She reports that she does not use drugs.  Allergies:  Allergies  Allergen  Reactions   Codeine Nausea And Vomiting   Penicillins Hives    Medications: Prior to Admission: (Not in a hospital admission)   Results for orders placed or performed during the hospital encounter of 09/25/19 (from the past 48 hour(s))  SARS Coronavirus 2 by RT PCR (hospital order, performed in [facility] hospital lab) Nasopharyngeal Nasopharyngeal Swab     Status: None   Collection Time: 09/25/19  1:47 PM   Specimen: Nasopharyngeal Swab  Result Value Ref Range   SARS Coronavirus 2 NEGATIVE NEGATIVE    Comment: (NOTE) SARS-CoV-2 target nucleic acids are NOT DETECTED. The SARS-CoV-2 RNA is generally detectable in upper and lower respiratory specimens during the acute phase of infection. The lowest concentration of SARS-CoV-2 viral copies this assay can detect is 250 copies / mL. A negative result does not preclude SARS-CoV-2 infection and should not  be used as the sole basis for treatment or other patient management decisions.  A negative result may occur with improper specimen collection / handling, submission of specimen other than nasopharyngeal swab, presence of viral mutation(s) within the areas targeted by this assay, and inadequate number of viral copies (<250 copies / mL). A negative result must be combined with clinical observations, patient history, and epidemiological information. Fact Sheet for Patients:   StrictlyIdeas.no Fact Sheet for Healthcare Providers: BankingDealers.co.za This test is not yet approved or cleared  by the [place] FDA and has been authorized for detection and/or diagnosis of SARS-CoV-2 by FDA under an Emergency Use Authorization (EUA).  This EUA will remain in effect (meaning this test can be used) for the duration of the COVID-19 declaration under Section 564(b)(1) of the Act, 21 U.S.C. section 360bbb-3(b)(1), unless the authorization is terminated or revoked sooner. Performed at [facility], [place] [address] [place], [place] [address]   Basic metabolic panel     Status: Abnormal   Collection Time: 09/25/19  1:47 PM  Result Value Ref Range   Sodium 139 135 - 145 mmol/L   Potassium 4.2 3.5 - 5.1 mmol/L   Chloride 108 98 - 111 mmol/L   CO2 24 22 - 32 mmol/L   Glucose, Bld 100 (H) 70 - 99 mg/dL    Comment: Glucose reference range applies only to samples taken after fasting for at least 8 hours.   BUN 17 8 - 23 mg/dL   Creatinine, Ser 0.71 0.44 - 1.00 mg/dL   Calcium 8.7 (L) 8.9 - 10.3 mg/dL   GFR calc non Af Amer >60 >60 mL/min   GFR calc Af Amer >60 >60 mL/min   Anion gap 7 5 - 15    Comment: Performed at [facility], [place] [address], [place], [place] [address]  CBC with Differential     Status: None   Collection Time: 09/25/19  1:47 PM  Result Value Ref Range   WBC 8.4 4.0 - 10.5 K/uL   RBC 4.38  3.87 - 5.11 MIL/uL   Hemoglobin 14.1 12.0 - 15.0 g/dL   HCT 43.0 36.0 - 46.0 %   MCV 98.2 80.0 - 100.0 fL   MCH 32.2 26.0 - 34.0 pg   MCHC 32.8 30.0 - 36.0 g/dL   RDW 13.5 11.5 - 15.5 %   Platelets 223 150 - 400 K/uL   nRBC 0.0 0.0 - 0.2 %   Neutrophils Relative % 79 %   Neutro Abs 6.8 1.7 - 7.7 K/uL   Lymphocytes Relative 12 %   Lymphs Abs 1.0 0.7 -  4.0 K/uL   Monocytes Relative 6 %   Monocytes Absolute 0.5 0.1 - 1.0 K/uL   Eosinophils Relative 1 %   Eosinophils Absolute 0.1 0.0 - 0.5 K/uL   Basophils Relative 1 %   Basophils Absolute 0.0 0.0 - 0.1 K/uL   Immature Granulocytes 1 %   Abs Immature Granulocytes 0.04 0.00 - 0.07 K/uL    Comment: Performed at [facility], [place] [address], [place], [place] [address]    DG Forearm Left  Result Date: 09/25/2019 CLINICAL DATA:  Fall, pain EXAM: LEFT FOREARM - 2 VIEW; LEFT HAND - COMPLETE 3+ VIEW; LEFT WRIST - COMPLETE 3+ VIEW COMPARISON:  None. FINDINGS: There are angulated intra-articular fractures of the distal left radius. The proximal radius and ulna are intact. The carpus proper is normally aligned. No fracture or dislocation of the left hand. There is severe arthrosis of the left thumb basal joint with otherwise mild osteoarthritic change. Soft tissues are unremarkable. IMPRESSION: 1. Angulated intra-articular fractures of the distal left radius. The proximal radius and ulna are intact. 2. The carpus proper is normally aligned. 3. No fracture or dislocation of the left hand. Severe arthrosis of the left thumb basal joint. Electronically Signed   By: Eddie Candle M.D.   On: 09/25/2019 12:27   DG Knee Right Port  Result Date: 09/25/2019 CLINICAL DATA:  Right knee pain post fall. EXAM: PORTABLE RIGHT KNEE - 1-2 VIEW COMPARISON:  None. FINDINGS: There is a mildly comminuted transverse patellar fracture. There is an associated hemorrhagic suprapatellar joint effusion. No other fractures are identified. Mild osteoarthritic  changes of the medial and lateral compartments of the right knee. Four line prepatellar soft tissue swelling. IMPRESSION: Mildly comminuted transverse right patellar fracture with associated hemorrhagic suprapatellar joint effusion. Electronically Signed   By: Fidela Salisbury M.D.   On: 09/25/2019 12:25    Review of Systems  Constitutional: Negative.   HENT: Positive for hearing loss.   Eyes: Negative.   Respiratory: Negative.   Cardiovascular: Negative.   Gastrointestinal: Negative.   Endocrine: Negative.   Genitourinary: Negative.   Musculoskeletal: Positive for arthralgias, back pain, gait problem, joint swelling and myalgias.  Skin: Positive for wound.   Blood pressure (!) 166/82, pulse 74, temperature 98.2 F (36.8 C), temperature source Oral, resp. rate 15, height 5' 2"  (1.575 m), weight 60.3 kg, SpO2 99 %. Physical Exam  HENT:  Head:    Musculoskeletal:     Left wrist: Swelling, deformity and tenderness present.     Right knee: Bony tenderness present. Decreased range of motion (unable to extend at knee, unable to hold straight leg). Tenderness present.       Legs:     Comments: Calf is supple NVI in right lower extremity    Assessment/Plan: Right transverse patella fracture: -Patient is being admitted to the hospital service -We are planning on doing a right knee patella ORIF versus patellectomy most likely on Tuesday -Currently has an Ace bandage over the right knee as well as a knee immobilizer on.  She will remain in this until day of surgery -partial weightbearing at this time, okay for gentle ROM of ankle   Left distal radius fracture is being taken care of by Hand surgeon on call today  This was all discussed with Dr. Sydnee Cabal and he is in agreement with this plan   Nettie Elm [facility] [phone] 09/25/2019, 3:45 PM

## 2019-09-25 NOTE — Assessment & Plan Note (Signed)
-   s/p mechanical fall at dog park - per xray: Angulated intra-articular fractures of the distal left radius - hand surgery consulted, plans to see patient on 5/24 to evaluate; wrist in splint currently  - continue supportive care and pain control for now

## 2019-09-25 NOTE — Hospital Course (Signed)
Heidi [name] is an 85 yo CF with PMH arthritis, UC (left sided, dx 14 yrs ago, treated with mesalamine), osteoporosis, HLD, HTN, macular degeneration, GERD, diverticulosis, PBC (on ursodiol) who came to the ER after suffering a fall earlier today. She was at the dog park and accidentally tripped and fell to the concrete ground she was on.  She says she was not really able to brace her fall and as she fell she hit her knees on the ground, right harder than left and then fell onto her left wrist, and then struck her face on the ground.  As she stood up she noticed immediate blood from her lip.  She was able to make it back to her car and even drive home, however by this time she could barely bear weight on her right lower extremity and her left wrist had begun swelling.  She presented to the ER for further evaluation after her fall. She underwent multiple x-rays and CTs.  She was found to have a right patellar fracture and left distal radius fracture as well as a lacerated lip.  She underwent suture repair in the ER for her lip laceration. She was placed in a wrist splint and shoulder sling for her left wrist fracture and will undergo evaluation by hand surgery on Monday. She was also evaluated by orthopedic surgery while in the ER regarding her right patellar fracture and will undergo tentative plans for surgical repair on Tuesday, plan to be determined still. She is otherwise resting in bed more comfortable after having received pain medication and assessment of her fractures.  She understands the plan and is amenable.

## 2019-09-25 NOTE — ED Notes (Signed)
Report called a little late was taking another patient up

## 2019-09-26 LAB — BASIC METABOLIC PANEL
Anion gap: 11 (ref 5–15)
BUN: 13 mg/dL (ref 8–23)
CO2: 21 mmol/L — ABNORMAL LOW (ref 22–32)
Calcium: 8.7 mg/dL — ABNORMAL LOW (ref 8.9–10.3)
Chloride: 106 mmol/L (ref 98–111)
Creatinine, Ser: 0.52 mg/dL (ref 0.44–1.00)
GFR calc Af Amer: >60 mL/min (ref >60)
GFR calc non Af Amer: >60 mL/min (ref >60)
Glucose, Bld: 137 mg/dL — ABNORMAL HIGH (ref 70–99)
Potassium: 3.7 mmol/L (ref 3.5–5.1)
Sodium: 138 mmol/L (ref 135–145)

## 2019-09-26 LAB — CBC
HCT: 41.1 % (ref 36.0–46.0)
Hemoglobin: 13.6 g/dL (ref 12.0–15.0)
MCH: 31.9 pg (ref 26.0–34.0)
MCHC: 33.1 g/dL (ref 30.0–36.0)
MCV: 96.3 fL (ref 80.0–100.0)
Platelets: 206 10*3/uL (ref 150–400)
RBC: 4.27 MIL/uL (ref 3.87–5.11)
RDW: 13.5 % (ref 11.5–15.5)
WBC: 8.8 10*3/uL (ref 4.0–10.5)
nRBC: 0 % (ref 0.0–0.2)

## 2019-09-26 LAB — MAGNESIUM: Magnesium: 2.1 mg/dL (ref 1.7–2.4)

## 2019-09-26 LAB — MRSA PCR SCREENING: MRSA by PCR: NEGATIVE

## 2019-09-26 MED ORDER — ACETAMINOPHEN 500 MG PO TABS: 1000.0000 mg | ORAL_TABLET | Freq: Once | ORAL | Status: DC

## 2019-09-26 MED ORDER — ENOXAPARIN SODIUM 40 MG/0.4ML ~~LOC~~ SOLN
40.0000 mg | SUBCUTANEOUS | Status: DC
  Administered 2019-09-27 – 2019-09-28 (×2): 40 mg via SUBCUTANEOUS
  Filled 2019-09-26 (×2): qty 0.4

## 2019-09-26 MED ORDER — URSODIOL 300 MG PO CAPS
300.0000 mg | ORAL_CAPSULE | ORAL | Status: DC
  Administered 2019-09-26 – 2019-09-28 (×2): 300 mg via ORAL
  Filled 2019-09-26 (×2): qty 1

## 2019-09-26 MED ORDER — LACTATED RINGERS IV SOLN: INTRAVENOUS | Status: DC

## 2019-09-26 MED ORDER — URSODIOL 300 MG PO CAPS
300.0000 mg | ORAL_CAPSULE | Freq: Every day | ORAL | Status: DC
  Administered 2019-09-26 – 2019-09-28 (×3): 300 mg via ORAL
  Filled 2019-09-26 (×4): qty 1

## 2019-09-26 MED ORDER — SENNOSIDES-DOCUSATE SODIUM 8.6-50 MG PO TABS
2.0000 | ORAL_TABLET | Freq: Two times a day (BID) | ORAL | Status: DC
  Administered 2019-09-26: 1 via ORAL
  Administered 2019-09-27: 2 via ORAL
  Filled 2019-09-26 (×5): qty 2

## 2019-09-26 MED ORDER — CHLORHEXIDINE GLUCONATE 4 % EX LIQD: 60.0000 mL | Freq: Once | CUTANEOUS | Status: DC

## 2019-09-26 MED ORDER — ACETAMINOPHEN 500 MG PO TABS
1000.0000 mg | ORAL_TABLET | ORAL | Status: DC
  Filled 2019-09-26: qty 2

## 2019-09-26 MED ORDER — URSODIOL 300 MG PO CAPS
600.0000 mg | ORAL_CAPSULE | ORAL | Status: DC
  Administered 2019-09-29: 600 mg via ORAL
  Filled 2019-09-26 (×2): qty 2

## 2019-09-26 MED ORDER — TRANEXAMIC ACID-NACL 1000-0.7 MG/100ML-% IV SOLN: 1000.0000 mg | INTRAVENOUS | Status: DC

## 2019-09-26 MED ORDER — CEFAZOLIN SODIUM-DEXTROSE 2-4 GM/100ML-% IV SOLN
2.0000 g | INTRAVENOUS | Status: AC
  Administered 2019-09-27: 2 g via INTRAVENOUS
  Filled 2019-09-26: qty 100

## 2019-09-26 NOTE — Consult Note (Signed)
ORTHOPAEDIC CONSULTATION  REQUESTING PHYSICIAN: Kayleen Memos, DO  Chief Complaint: Fall, right knee pain and left wrist pain  HPI: Heidi [name] is a 85 y.o. female who complains of right knee and left wrist pain after a fall yesterday.  She presented to the emergency department where x-rays showed a closed right patella fracture and left wrist fracture.  She is followed by Rosanne Gutting and Dr. Theda Sers was asked to see the patient about her right patella.  Dr. Percell Miller on hand call was consulted about her left wrist.  This morning, she complains mainly of right knee pain.  She has chronic right knee pain due to arthritis that is especially worse negotiating stairs.  Her left wrist pain is controlled.  She denies numbness or paresthesia.  She has known [place] OA.  No known prior history of left wrist fracture or surgery.  No history of MI, CVA, DVT, PE.  Allergies to codeine-N/V and penicillin-hives.  Past Medical History:  Diagnosis Date  . Allergy   . Arthritis   . Basal cell carcinoma of forehead ~ 2008  . Cataract   . Chronic lower back pain   . Cirrhosis, primary biliary   . Diverticulosis of colon   . Dysrhythmia   . GERD (gastroesophageal reflux disease)   . Hard of hearing    bilat   . Heart murmur   . High cholesterol   . History of hiatal hernia   . Hx of colon cancer, stage I   . Hypertension   . Macular degeneration   . Multiple falls   . Osteoporosis    osteopenia  . Raynaud disease    hands   . Ulcerative colitis   . Venous insufficiency    lower legs   Past Surgical History:  Procedure Laterality Date  . APPENDECTOMY    . CATARACT EXTRACTION W/ INTRAOCULAR LENS  IMPLANT, BILATERAL Bilateral ~ 2008  . COLONOSCOPY    . KNEE ARTHROSCOPY Right 1990's  . KNEE ARTHROSCOPY Left 08/23/2015   Procedure: ARTHROSCOPY KNEE PARTIAL MEDIAL AND LATERAL MENISCECTOMY AND DEBRIDMENT;  Surgeon: Susa Day, MD;  Location: [facility];  Service: Orthopedics;  Laterality:  Left;  . LAPAROSCOPIC PARTIAL COLECTOMY N/A 09/15/2012   Procedure: LAPAROSCOPIC ILEOCOLECTOMY;  Surgeon: Stark Klein, MD;  Location: [facility];  Service: General;  Laterality: N/A;  . LAPAROSCOPIC RIGHT COLON RESECTION  09/15/2012  . MOUTH SURGERY    . POLYPECTOMY    . TONSILLECTOMY  1952  . TUBAL LIGATION  1973   Social History   Socioeconomic History  . Marital status: Widowed    Spouse name: Not on file  . Number of children: 3  . Years of education: Not on file  . Highest education level: Not on file  Occupational History  . Occupation: Compliance    Comment: insurance    Employer: [employer]  Tobacco Use  . Smoking status: Former Smoker    Packs/day: 0.50    Years: 40.00    Pack years: 20.00    Types: Cigarettes    Quit date: 01/16/2012    Years since quitting: 7.6  . Smokeless tobacco: Never Used  Substance and Sexual Activity  . Alcohol use: Yes    Alcohol/week: 10.0 standard drinks    Types: 10 Glasses of wine per week  . Drug use: No  . Sexual activity: Not Currently  Other Topics Concern  . Not on file  Social History Narrative   Widowed--lives alone  Drives and independent in [place]   [employer] industry   Social Determinants of Health   Financial Resource Strain:   . Difficulty of Paying Living Expenses:   Food Insecurity:   . Worried About Charity fundraiser in the Last Year:   . Arboriculturist in the Last Year:   Transportation Needs:   . Film/video editor (Medical):   Marland Kitchen Lack of Transportation (Non-Medical):   Physical Activity:   . Days of Exercise per Week:   . Minutes of Exercise per Session:   Stress:   . Feeling of Stress :   Social Connections:   . Frequency of Communication with Friends and Family:   . Frequency of Social Gatherings with Friends and Family:   . Attends Religious Services:   . Active Member of Clubs or Organizations:   . Attends Archivist Meetings:   Marland Kitchen Marital Status:    Family  History  Problem Relation Age of Onset  . Heart disease Mother   . Heart disease Brother   . Stroke Father   . Lung cancer Brother   . Breast cancer Paternal Aunt   . Ovarian cancer Paternal Grandmother   . Colon cancer Neg Hx   . Esophageal cancer Neg Hx   . Stomach cancer Neg Hx   . Pancreatic cancer Neg Hx   . Rectal cancer Neg Hx    Allergies  Allergen Reactions  . Codeine Nausea And Vomiting  . Penicillins Hives   Prior to Admission medications   Medication Sig Start Date End Date Taking? Authorizing Provider  cetirizine (ZYRTEC) 10 MG tablet Take 10 mg by mouth daily.   Yes [provider]  Cholecalciferol (VITAMIN D PO) Take 1 tablet by mouth daily.   Yes [provider]  Cyanocobalamin (VITAMIN B 12 PO) Take 500 mg by mouth daily.   Yes [provider]  ezetimibe (ZETIA) 10 MG tablet Take 5 mg by mouth 2 (two) times a week. Wednesday and Sunday.   Yes [provider]  fluticasone (FLONASE) 50 MCG/ACT nasal spray  01/19/18  Yes [provider]  ibandronate (BONIVA) 150 MG tablet Take 150 mg by mouth every 30 (thirty) days. 09/19/18  Yes [provider]  irbesartan (AVAPRO) 300 MG tablet Take 300 mg by mouth every evening. 09/14/19  Yes [provider]  mesalamine (LIALDA) 1.2 g EC tablet TAKE TWO TABLETS EVERY DAY WITH BREAKFAST Patient taking differently: Take 2.4 g by mouth daily with breakfast.  04/22/19  Yes Armbruster, Carlota Raspberry, MD  Multiple Vitamins-Minerals (PRESERVISION AREDS PO) Take 1 tablet by mouth 2 (two) times daily.   Yes [provider]  omeprazole (PRILOSEC) 20 MG capsule TAKE ONE CAPSULE EACH DAY Patient taking differently: Take 20 mg by mouth daily.  06/02/14  Yes Inda Castle, MD  Pitavastatin Calcium (LIVALO) 4 MG TABS Take 0.5 tablets by mouth 2 (two) times a week. Wednesday and Sunday   Yes [provider]  ursodiol (ACTIGALL) 300 MG capsule You are past due for an Office  visit with Dr. Havery Moros. Last seen in 2019. Please call and schedule as soon as possible. Thank you Patient taking differently: Take 600-900 mg by mouth daily. Take two capsules by mouth daily, alternating with three capsules daily. 09/12/19  Yes Armbruster, Carlota Raspberry, MD   DG Forearm Left  Result Date: 09/25/2019 CLINICAL DATA:  Fall, pain EXAM: LEFT FOREARM - 2 VIEW; LEFT HAND - COMPLETE 3+ VIEW; LEFT  WRIST - COMPLETE 3+ VIEW COMPARISON:  None. FINDINGS: There are angulated intra-articular fractures of the distal left radius. The proximal radius and ulna are intact. The carpus proper is normally aligned. No fracture or dislocation of the left hand. There is severe arthrosis of the left thumb basal joint with otherwise mild osteoarthritic change. Soft tissues are unremarkable. IMPRESSION: 1. Angulated intra-articular fractures of the distal left radius. The proximal radius and ulna are intact. 2. The carpus proper is normally aligned. 3. No fracture or dislocation of the left hand. Severe arthrosis of the left thumb basal joint. Electronically Signed   By: Eddie Candle M.D.   On: 09/25/2019 12:27   DG Wrist Complete Left  Result Date: 09/25/2019 CLINICAL DATA:  Fall, pain EXAM: LEFT FOREARM - 2 VIEW; LEFT HAND - COMPLETE 3+ VIEW; LEFT WRIST - COMPLETE 3+ VIEW COMPARISON:  None. FINDINGS: There are angulated intra-articular fractures of the distal left radius. The proximal radius and ulna are intact. The carpus proper is normally aligned. No fracture or dislocation of the left hand. There is severe arthrosis of the left thumb basal joint with otherwise mild osteoarthritic change. Soft tissues are unremarkable. IMPRESSION: 1. Angulated intra-articular fractures of the distal left radius. The proximal radius and ulna are intact. 2. The carpus proper is normally aligned. 3. No fracture or dislocation of the left hand. Severe arthrosis of the left thumb basal joint. Electronically Signed   By: Eddie Candle M.D.    On: 09/25/2019 12:27   CT Head Wo Contrast  Result Date: 09/25/2019 CLINICAL DATA:  Fall, facial trauma, laceration to upper lip EXAM: CT HEAD WITHOUT CONTRAST CT MAXILLOFACIAL WITHOUT CONTRAST CT CERVICAL SPINE WITHOUT CONTRAST TECHNIQUE: Multidetector CT imaging of the head, cervical spine, and maxillofacial structures were performed using the standard protocol without intravenous contrast. Multiplanar CT image reconstructions of the cervical spine and maxillofacial structures were also generated. COMPARISON:  None. FINDINGS: CT HEAD FINDINGS Brain: No evidence of acute infarction, hemorrhage, hydrocephalus, extra-axial collection or mass lesion/mass effect. Periventricular white matter hypodensity. Vascular: No hyperdense vessel or unexpected calcification. CT FACIAL BONES FINDINGS Skull: Normal. Negative for fracture or focal lesion. Facial bones: No displaced fractures or dislocations. Sinuses/Orbits: No acute finding. Other: None. CT CERVICAL SPINE FINDINGS Alignment: Normal. Skull base and vertebrae: No acute fracture. No primary bone lesion or focal pathologic process. Soft tissues and spinal canal: No prevertebral fluid or swelling. No visible canal hematoma. Disc levels: Mild multilevel disc space height loss and osteophytosis. Upper chest: Negative. Other: None. IMPRESSION: 1. No acute intracranial pathology. Small-vessel white matter disease. 2. No displaced fractures or dislocations of the facial bones. 3. No fracture or static subluxation of the cervical spine. 4. Mild multilevel degenerative disc disease of the cervical spine. Electronically Signed   By: Eddie Candle M.D.   On: 09/25/2019 12:33   CT Cervical Spine Wo Contrast  Result Date: 09/25/2019 CLINICAL DATA:  Fall, facial trauma, laceration to upper lip EXAM: CT HEAD WITHOUT CONTRAST CT MAXILLOFACIAL WITHOUT CONTRAST CT CERVICAL SPINE WITHOUT CONTRAST TECHNIQUE: Multidetector CT imaging of the head, cervical spine, and maxillofacial  structures were performed using the standard protocol without intravenous contrast. Multiplanar CT image reconstructions of the cervical spine and maxillofacial structures were also generated. COMPARISON:  None. FINDINGS: CT HEAD FINDINGS Brain: No evidence of acute infarction, hemorrhage, hydrocephalus, extra-axial collection or mass lesion/mass effect. Periventricular white matter hypodensity. Vascular: No hyperdense vessel or unexpected calcification. CT FACIAL BONES FINDINGS Skull: Normal. Negative for fracture or  focal lesion. Facial bones: No displaced fractures or dislocations. Sinuses/Orbits: No acute finding. Other: None. CT CERVICAL SPINE FINDINGS Alignment: Normal. Skull base and vertebrae: No acute fracture. No primary bone lesion or focal pathologic process. Soft tissues and spinal canal: No prevertebral fluid or swelling. No visible canal hematoma. Disc levels: Mild multilevel disc space height loss and osteophytosis. Upper chest: Negative. Other: None. IMPRESSION: 1. No acute intracranial pathology. Small-vessel white matter disease. 2. No displaced fractures or dislocations of the facial bones. 3. No fracture or static subluxation of the cervical spine. 4. Mild multilevel degenerative disc disease of the cervical spine. Electronically Signed   By: Eddie Candle M.D.   On: 09/25/2019 12:33   DG Knee Right Port  Result Date: 09/25/2019 CLINICAL DATA:  Right knee pain post fall. EXAM: PORTABLE RIGHT KNEE - 1-2 VIEW COMPARISON:  None. FINDINGS: There is a mildly comminuted transverse patellar fracture. There is an associated hemorrhagic suprapatellar joint effusion. No other fractures are identified. Mild osteoarthritic changes of the medial and lateral compartments of the right knee. Four line prepatellar soft tissue swelling. IMPRESSION: Mildly comminuted transverse right patellar fracture with associated hemorrhagic suprapatellar joint effusion. Electronically Signed   By: Fidela Salisbury M.D.    On: 09/25/2019 12:25   DG Hand Complete Left  Result Date: 09/25/2019 CLINICAL DATA:  Fall, pain EXAM: LEFT FOREARM - 2 VIEW; LEFT HAND - COMPLETE 3+ VIEW; LEFT WRIST - COMPLETE 3+ VIEW COMPARISON:  None. FINDINGS: There are angulated intra-articular fractures of the distal left radius. The proximal radius and ulna are intact. The carpus proper is normally aligned. No fracture or dislocation of the left hand. There is severe arthrosis of the left thumb basal joint with otherwise mild osteoarthritic change. Soft tissues are unremarkable. IMPRESSION: 1. Angulated intra-articular fractures of the distal left radius. The proximal radius and ulna are intact. 2. The carpus proper is normally aligned. 3. No fracture or dislocation of the left hand. Severe arthrosis of the left thumb basal joint. Electronically Signed   By: Eddie Candle M.D.   On: 09/25/2019 12:27   CT Maxillofacial WO CM  Result Date: 09/25/2019 CLINICAL DATA:  Fall, facial trauma, laceration to upper lip EXAM: CT HEAD WITHOUT CONTRAST CT MAXILLOFACIAL WITHOUT CONTRAST CT CERVICAL SPINE WITHOUT CONTRAST TECHNIQUE: Multidetector CT imaging of the head, cervical spine, and maxillofacial structures were performed using the standard protocol without intravenous contrast. Multiplanar CT image reconstructions of the cervical spine and maxillofacial structures were also generated. COMPARISON:  None. FINDINGS: CT HEAD FINDINGS Brain: No evidence of acute infarction, hemorrhage, hydrocephalus, extra-axial collection or mass lesion/mass effect. Periventricular white matter hypodensity. Vascular: No hyperdense vessel or unexpected calcification. CT FACIAL BONES FINDINGS Skull: Normal. Negative for fracture or focal lesion. Facial bones: No displaced fractures or dislocations. Sinuses/Orbits: No acute finding. Other: None. CT CERVICAL SPINE FINDINGS Alignment: Normal. Skull base and vertebrae: No acute fracture. No primary bone lesion or focal pathologic  process. Soft tissues and spinal canal: No prevertebral fluid or swelling. No visible canal hematoma. Disc levels: Mild multilevel disc space height loss and osteophytosis. Upper chest: Negative. Other: None. IMPRESSION: 1. No acute intracranial pathology. Small-vessel white matter disease. 2. No displaced fractures or dislocations of the facial bones. 3. No fracture or static subluxation of the cervical spine. 4. Mild multilevel degenerative disc disease of the cervical spine. Electronically Signed   By: Eddie Candle M.D.   On: 09/25/2019 12:33    Positive ROS: All other systems have been reviewed  and were otherwise negative with the exception of those mentioned in the HPI and as above.  Objective: Labs cbc Recent Labs    09/25/19 1347 09/26/19 0330  WBC 8.4 8.8  HGB 14.1 13.6  HCT 43.0 41.1  PLT 223 206    Labs inflam No results for input(s): CRP in the last 72 hours.  Invalid input(s): ESR  Labs coag No results for input(s): INR, PTT in the last 72 hours.  Invalid input(s): PT  Recent Labs    09/25/19 1347 09/26/19 0330  NA 139 138  K 4.2 3.7  CL 108 106  CO2 24 21*  GLUCOSE 100* 137*  BUN 17 13  CREATININE 0.71 0.52  CALCIUM 8.7* 8.7*    Physical Exam: Vitals:   09/26/19 0003 09/26/19 0615  BP: (!) 143/79 134/75  Pulse: 64 66  Resp: 16 18  Temp: (!) 97.3 F (36.3 C) 98 F (36.7 C)  SpO2: 97% 98%   General: Alert, no acute distress.  Upright in bed.  Calm, conversant.  Daughter at bedside. Mental status: Alert and Oriented x3 Neurologic: Speech Clear and organized, no gross focal findings or movement disorder appreciated. Respiratory: No cyanosis, no use of accessory musculature Cardiovascular: No pedal edema GI: Abdomen is soft and non-tender, non-distended. Skin: Warm and dry.   Extremities: Warm  Psychiatric: Patient is competent for consent with normal mood and affect  MUSCULOSKELETAL:  RLE in knee immobilizer.  Feet warm.  Anterior inferior  superficial abrasion.  MVI distally. LUE: sugar tong splint in place and in good condition..  Hand warm.  Fingers with mild swelling.  NVI. Other extremities are atraumatic with painless ROM and NVI.  Assessment / Plan: Principal Problem:   Multiple fractures Active Problems:   Essential hypertension   Ulcerative colitis (HCC)   Biliary cirrhosis (HCC)   Distal radius fracture, left   Patellar fracture   Right patella fracture Dr. Theda Sers considering ORIF versus patellectomy given fracture and history of OA.  Left distal radius fracture Discussed risk and benefits of ORIF versus nonoperative management.  Pre and postoperative course also discussed.  Patient verbalizes understanding of same and if patella surgery is needed, she agrees with plan for surgical intervention.    Dr. Percell Miller and Dr. Theda Sers will coordinate surgical plans and solidify same today. N.p.o. at midnight.  Contact information:  Edmonia Lynch MD, Roxan Hockey PA-C  Prudencio Burly III PA-C 09/26/2019 7:35 AM

## 2019-09-26 NOTE — H&P (View-Only) (Signed)
ORTHOPAEDIC CONSULTATION  REQUESTING PHYSICIAN: Kayleen Memos, DO  Chief Complaint: Fall, right knee pain and left wrist pain  HPI: Heidi [name] is a 85 y.o. female who complains of right knee and left wrist pain after a fall yesterday.  She presented to the emergency department where x-rays showed a closed right patella fracture and left wrist fracture.  She is followed by Rosanne Gutting and Dr. Theda Sers was asked to see the patient about her right patella.  Dr. Percell Miller on hand call was consulted about her left wrist.  This morning, she complains mainly of right knee pain.  She has chronic right knee pain due to arthritis that is especially worse negotiating stairs.  Her left wrist pain is controlled.  She denies numbness or paresthesia.  She has known [place] OA.  No known prior history of left wrist fracture or surgery.  No history of MI, CVA, DVT, PE.  Allergies to codeine-N/V and penicillin-hives.  Past Medical History:  Diagnosis Date  . Allergy   . Arthritis   . Basal cell carcinoma of forehead ~ 2008  . Cataract   . Chronic lower back pain   . Cirrhosis, primary biliary   . Diverticulosis of colon   . Dysrhythmia   . GERD (gastroesophageal reflux disease)   . Hard of hearing    bilat   . Heart murmur   . High cholesterol   . History of hiatal hernia   . Hx of colon cancer, stage I   . Hypertension   . Macular degeneration   . Multiple falls   . Osteoporosis    osteopenia  . Raynaud disease    hands   . Ulcerative colitis   . Venous insufficiency    lower legs   Past Surgical History:  Procedure Laterality Date  . APPENDECTOMY    . CATARACT EXTRACTION W/ INTRAOCULAR LENS  IMPLANT, BILATERAL Bilateral ~ 2008  . COLONOSCOPY    . KNEE ARTHROSCOPY Right 1990's  . KNEE ARTHROSCOPY Left 08/23/2015   Procedure: ARTHROSCOPY KNEE PARTIAL MEDIAL AND LATERAL MENISCECTOMY AND DEBRIDMENT;  Surgeon: Susa Day, MD;  Location: [facility];  Service: Orthopedics;  Laterality:  Left;  . LAPAROSCOPIC PARTIAL COLECTOMY N/A 09/15/2012   Procedure: LAPAROSCOPIC ILEOCOLECTOMY;  Surgeon: Stark Klein, MD;  Location: [facility];  Service: General;  Laterality: N/A;  . LAPAROSCOPIC RIGHT COLON RESECTION  09/15/2012  . MOUTH SURGERY    . POLYPECTOMY    . TONSILLECTOMY  1952  . TUBAL LIGATION  1973   Social History   Socioeconomic History  . Marital status: Widowed    Spouse name: Not on file  . Number of children: 3  . Years of education: Not on file  . Highest education level: Not on file  Occupational History  . Occupation: Compliance    Comment: insurance    Employer: [employer]  Tobacco Use  . Smoking status: Former Smoker    Packs/day: 0.50    Years: 40.00    Pack years: 20.00    Types: Cigarettes    Quit date: 01/16/2012    Years since quitting: 7.6  . Smokeless tobacco: Never Used  Substance and Sexual Activity  . Alcohol use: Yes    Alcohol/week: 10.0 standard drinks    Types: 10 Glasses of wine per week  . Drug use: No  . Sexual activity: Not Currently  Other Topics Concern  . Not on file  Social History Narrative   Widowed--lives alone  Drives and independent in [place]   [employer] industry   Social Determinants of Health   Financial Resource Strain:   . Difficulty of Paying Living Expenses:   Food Insecurity:   . Worried About Charity fundraiser in the Last Year:   . Arboriculturist in the Last Year:   Transportation Needs:   . Film/video editor (Medical):   Marland Kitchen Lack of Transportation (Non-Medical):   Physical Activity:   . Days of Exercise per Week:   . Minutes of Exercise per Session:   Stress:   . Feeling of Stress :   Social Connections:   . Frequency of Communication with Friends and Family:   . Frequency of Social Gatherings with Friends and Family:   . Attends Religious Services:   . Active Member of Clubs or Organizations:   . Attends Archivist Meetings:   Marland Kitchen Marital Status:    Family  History  Problem Relation Age of Onset  . Heart disease Mother   . Heart disease Brother   . Stroke Father   . Lung cancer Brother   . Breast cancer Paternal Aunt   . Ovarian cancer Paternal Grandmother   . Colon cancer Neg Hx   . Esophageal cancer Neg Hx   . Stomach cancer Neg Hx   . Pancreatic cancer Neg Hx   . Rectal cancer Neg Hx    Allergies  Allergen Reactions  . Codeine Nausea And Vomiting  . Penicillins Hives   Prior to Admission medications   Medication Sig Start Date End Date Taking? Authorizing Provider  cetirizine (ZYRTEC) 10 MG tablet Take 10 mg by mouth daily.   Yes [provider]  Cholecalciferol (VITAMIN D PO) Take 1 tablet by mouth daily.   Yes [provider]  Cyanocobalamin (VITAMIN B 12 PO) Take 500 mg by mouth daily.   Yes [provider]  ezetimibe (ZETIA) 10 MG tablet Take 5 mg by mouth 2 (two) times a week. Wednesday and Sunday.   Yes [provider]  fluticasone (FLONASE) 50 MCG/ACT nasal spray  01/19/18  Yes [provider]  ibandronate (BONIVA) 150 MG tablet Take 150 mg by mouth every 30 (thirty) days. 09/19/18  Yes [provider]  irbesartan (AVAPRO) 300 MG tablet Take 300 mg by mouth every evening. 09/14/19  Yes [provider]  mesalamine (LIALDA) 1.2 g EC tablet TAKE TWO TABLETS EVERY DAY WITH BREAKFAST Patient taking differently: Take 2.4 g by mouth daily with breakfast.  04/22/19  Yes Armbruster, Carlota Raspberry, MD  Multiple Vitamins-Minerals (PRESERVISION AREDS PO) Take 1 tablet by mouth 2 (two) times daily.   Yes [provider]  omeprazole (PRILOSEC) 20 MG capsule TAKE ONE CAPSULE EACH DAY Patient taking differently: Take 20 mg by mouth daily.  06/02/14  Yes Inda Castle, MD  Pitavastatin Calcium (LIVALO) 4 MG TABS Take 0.5 tablets by mouth 2 (two) times a week. Wednesday and Sunday   Yes [provider]  ursodiol (ACTIGALL) 300 MG capsule You are past due for an Office  visit with Dr. Havery Moros. Last seen in 2019. Please call and schedule as soon as possible. Thank you Patient taking differently: Take 600-900 mg by mouth daily. Take two capsules by mouth daily, alternating with three capsules daily. 09/12/19  Yes Armbruster, Carlota Raspberry, MD   DG Forearm Left  Result Date: 09/25/2019 CLINICAL DATA:  Fall, pain EXAM: LEFT FOREARM - 2 VIEW; LEFT HAND - COMPLETE 3+ VIEW; LEFT  WRIST - COMPLETE 3+ VIEW COMPARISON:  None. FINDINGS: There are angulated intra-articular fractures of the distal left radius. The proximal radius and ulna are intact. The carpus proper is normally aligned. No fracture or dislocation of the left hand. There is severe arthrosis of the left thumb basal joint with otherwise mild osteoarthritic change. Soft tissues are unremarkable. IMPRESSION: 1. Angulated intra-articular fractures of the distal left radius. The proximal radius and ulna are intact. 2. The carpus proper is normally aligned. 3. No fracture or dislocation of the left hand. Severe arthrosis of the left thumb basal joint. Electronically Signed   By: Eddie Candle M.D.   On: 09/25/2019 12:27   DG Wrist Complete Left  Result Date: 09/25/2019 CLINICAL DATA:  Fall, pain EXAM: LEFT FOREARM - 2 VIEW; LEFT HAND - COMPLETE 3+ VIEW; LEFT WRIST - COMPLETE 3+ VIEW COMPARISON:  None. FINDINGS: There are angulated intra-articular fractures of the distal left radius. The proximal radius and ulna are intact. The carpus proper is normally aligned. No fracture or dislocation of the left hand. There is severe arthrosis of the left thumb basal joint with otherwise mild osteoarthritic change. Soft tissues are unremarkable. IMPRESSION: 1. Angulated intra-articular fractures of the distal left radius. The proximal radius and ulna are intact. 2. The carpus proper is normally aligned. 3. No fracture or dislocation of the left hand. Severe arthrosis of the left thumb basal joint. Electronically Signed   By: Eddie Candle M.D.    On: 09/25/2019 12:27   CT Head Wo Contrast  Result Date: 09/25/2019 CLINICAL DATA:  Fall, facial trauma, laceration to upper lip EXAM: CT HEAD WITHOUT CONTRAST CT MAXILLOFACIAL WITHOUT CONTRAST CT CERVICAL SPINE WITHOUT CONTRAST TECHNIQUE: Multidetector CT imaging of the head, cervical spine, and maxillofacial structures were performed using the standard protocol without intravenous contrast. Multiplanar CT image reconstructions of the cervical spine and maxillofacial structures were also generated. COMPARISON:  None. FINDINGS: CT HEAD FINDINGS Brain: No evidence of acute infarction, hemorrhage, hydrocephalus, extra-axial collection or mass lesion/mass effect. Periventricular white matter hypodensity. Vascular: No hyperdense vessel or unexpected calcification. CT FACIAL BONES FINDINGS Skull: Normal. Negative for fracture or focal lesion. Facial bones: No displaced fractures or dislocations. Sinuses/Orbits: No acute finding. Other: None. CT CERVICAL SPINE FINDINGS Alignment: Normal. Skull base and vertebrae: No acute fracture. No primary bone lesion or focal pathologic process. Soft tissues and spinal canal: No prevertebral fluid or swelling. No visible canal hematoma. Disc levels: Mild multilevel disc space height loss and osteophytosis. Upper chest: Negative. Other: None. IMPRESSION: 1. No acute intracranial pathology. Small-vessel white matter disease. 2. No displaced fractures or dislocations of the facial bones. 3. No fracture or static subluxation of the cervical spine. 4. Mild multilevel degenerative disc disease of the cervical spine. Electronically Signed   By: Eddie Candle M.D.   On: 09/25/2019 12:33   CT Cervical Spine Wo Contrast  Result Date: 09/25/2019 CLINICAL DATA:  Fall, facial trauma, laceration to upper lip EXAM: CT HEAD WITHOUT CONTRAST CT MAXILLOFACIAL WITHOUT CONTRAST CT CERVICAL SPINE WITHOUT CONTRAST TECHNIQUE: Multidetector CT imaging of the head, cervical spine, and maxillofacial  structures were performed using the standard protocol without intravenous contrast. Multiplanar CT image reconstructions of the cervical spine and maxillofacial structures were also generated. COMPARISON:  None. FINDINGS: CT HEAD FINDINGS Brain: No evidence of acute infarction, hemorrhage, hydrocephalus, extra-axial collection or mass lesion/mass effect. Periventricular white matter hypodensity. Vascular: No hyperdense vessel or unexpected calcification. CT FACIAL BONES FINDINGS Skull: Normal. Negative for fracture or  focal lesion. Facial bones: No displaced fractures or dislocations. Sinuses/Orbits: No acute finding. Other: None. CT CERVICAL SPINE FINDINGS Alignment: Normal. Skull base and vertebrae: No acute fracture. No primary bone lesion or focal pathologic process. Soft tissues and spinal canal: No prevertebral fluid or swelling. No visible canal hematoma. Disc levels: Mild multilevel disc space height loss and osteophytosis. Upper chest: Negative. Other: None. IMPRESSION: 1. No acute intracranial pathology. Small-vessel white matter disease. 2. No displaced fractures or dislocations of the facial bones. 3. No fracture or static subluxation of the cervical spine. 4. Mild multilevel degenerative disc disease of the cervical spine. Electronically Signed   By: Eddie Candle M.D.   On: 09/25/2019 12:33   DG Knee Right Port  Result Date: 09/25/2019 CLINICAL DATA:  Right knee pain post fall. EXAM: PORTABLE RIGHT KNEE - 1-2 VIEW COMPARISON:  None. FINDINGS: There is a mildly comminuted transverse patellar fracture. There is an associated hemorrhagic suprapatellar joint effusion. No other fractures are identified. Mild osteoarthritic changes of the medial and lateral compartments of the right knee. Four line prepatellar soft tissue swelling. IMPRESSION: Mildly comminuted transverse right patellar fracture with associated hemorrhagic suprapatellar joint effusion. Electronically Signed   By: Fidela Salisbury M.D.    On: 09/25/2019 12:25   DG Hand Complete Left  Result Date: 09/25/2019 CLINICAL DATA:  Fall, pain EXAM: LEFT FOREARM - 2 VIEW; LEFT HAND - COMPLETE 3+ VIEW; LEFT WRIST - COMPLETE 3+ VIEW COMPARISON:  None. FINDINGS: There are angulated intra-articular fractures of the distal left radius. The proximal radius and ulna are intact. The carpus proper is normally aligned. No fracture or dislocation of the left hand. There is severe arthrosis of the left thumb basal joint with otherwise mild osteoarthritic change. Soft tissues are unremarkable. IMPRESSION: 1. Angulated intra-articular fractures of the distal left radius. The proximal radius and ulna are intact. 2. The carpus proper is normally aligned. 3. No fracture or dislocation of the left hand. Severe arthrosis of the left thumb basal joint. Electronically Signed   By: Eddie Candle M.D.   On: 09/25/2019 12:27   CT Maxillofacial WO CM  Result Date: 09/25/2019 CLINICAL DATA:  Fall, facial trauma, laceration to upper lip EXAM: CT HEAD WITHOUT CONTRAST CT MAXILLOFACIAL WITHOUT CONTRAST CT CERVICAL SPINE WITHOUT CONTRAST TECHNIQUE: Multidetector CT imaging of the head, cervical spine, and maxillofacial structures were performed using the standard protocol without intravenous contrast. Multiplanar CT image reconstructions of the cervical spine and maxillofacial structures were also generated. COMPARISON:  None. FINDINGS: CT HEAD FINDINGS Brain: No evidence of acute infarction, hemorrhage, hydrocephalus, extra-axial collection or mass lesion/mass effect. Periventricular white matter hypodensity. Vascular: No hyperdense vessel or unexpected calcification. CT FACIAL BONES FINDINGS Skull: Normal. Negative for fracture or focal lesion. Facial bones: No displaced fractures or dislocations. Sinuses/Orbits: No acute finding. Other: None. CT CERVICAL SPINE FINDINGS Alignment: Normal. Skull base and vertebrae: No acute fracture. No primary bone lesion or focal pathologic  process. Soft tissues and spinal canal: No prevertebral fluid or swelling. No visible canal hematoma. Disc levels: Mild multilevel disc space height loss and osteophytosis. Upper chest: Negative. Other: None. IMPRESSION: 1. No acute intracranial pathology. Small-vessel white matter disease. 2. No displaced fractures or dislocations of the facial bones. 3. No fracture or static subluxation of the cervical spine. 4. Mild multilevel degenerative disc disease of the cervical spine. Electronically Signed   By: Eddie Candle M.D.   On: 09/25/2019 12:33    Positive ROS: All other systems have been reviewed  and were otherwise negative with the exception of those mentioned in the HPI and as above.  Objective: Labs cbc Recent Labs    09/25/19 1347 09/26/19 0330  WBC 8.4 8.8  HGB 14.1 13.6  HCT 43.0 41.1  PLT 223 206    Labs inflam No results for input(s): CRP in the last 72 hours.  Invalid input(s): ESR  Labs coag No results for input(s): INR, PTT in the last 72 hours.  Invalid input(s): PT  Recent Labs    09/25/19 1347 09/26/19 0330  NA 139 138  K 4.2 3.7  CL 108 106  CO2 24 21*  GLUCOSE 100* 137*  BUN 17 13  CREATININE 0.71 0.52  CALCIUM 8.7* 8.7*    Physical Exam: Vitals:   09/26/19 0003 09/26/19 0615  BP: (!) 143/79 134/75  Pulse: 64 66  Resp: 16 18  Temp: (!) 97.3 F (36.3 C) 98 F (36.7 C)  SpO2: 97% 98%   General: Alert, no acute distress.  Upright in bed.  Calm, conversant.  Daughter at bedside. Mental status: Alert and Oriented x3 Neurologic: Speech Clear and organized, no gross focal findings or movement disorder appreciated. Respiratory: No cyanosis, no use of accessory musculature Cardiovascular: No pedal edema GI: Abdomen is soft and non-tender, non-distended. Skin: Warm and dry.   Extremities: Warm  Psychiatric: Patient is competent for consent with normal mood and affect  MUSCULOSKELETAL:  RLE in knee immobilizer.  Feet warm.  Anterior inferior  superficial abrasion.  MVI distally. LUE: sugar tong splint in place and in good condition..  Hand warm.  Fingers with mild swelling.  NVI. Other extremities are atraumatic with painless ROM and NVI.  Assessment / Plan: Principal Problem:   Multiple fractures Active Problems:   Essential hypertension   Ulcerative colitis (HCC)   Biliary cirrhosis (HCC)   Distal radius fracture, left   Patellar fracture   Right patella fracture Dr. Theda Sers considering ORIF versus patellectomy given fracture and history of OA.  Left distal radius fracture Discussed risk and benefits of ORIF versus nonoperative management.  Pre and postoperative course also discussed.  Patient verbalizes understanding of same and if patella surgery is needed, she agrees with plan for surgical intervention.    Dr. Percell Miller and Dr. Theda Sers will coordinate surgical plans and solidify same today. N.p.o. at midnight.  Contact information:  Edmonia Lynch MD, Roxan Hockey PA-C  Prudencio Burly III PA-C 09/26/2019 7:35 AM

## 2019-09-26 NOTE — Progress Notes (Signed)
PROGRESS NOTE  Heidi [name] TML:[id number] DOB: 14-Oct-1934 DOA: 09/25/2019 PCP: Crist Infante, MD  HPI/Recap of past 24 hours: [name] is an 85 yo CF with PMH arthritis, UC (left sided, dx 14 yrs ago, treated with mesalamine), osteoporosis, HLD, HTN, macular degeneration, GERD, diverticulosis, PBC (on ursodiol) who came to the ER after suffering a fall earlier today. She was at the dog park and accidentally tripped and fell to the concrete ground she was on.  She says she was not really able to brace her fall and as she fell she hit her knees on the ground, right harder than left and then fell onto her left wrist, and then struck her face on the ground.  As she stood up she noticed immediate blood from her lip.  She was able to make it back to her car and even drive home, however by this time she could barely bear weight on her right lower extremity and her left wrist had begun swelling.  She presented to the ER for further evaluation after her fall. She underwent multiple x-rays and CTs.  She was found to have a right patellar fracture and left distal radius fracture as well as a lacerated lip.  She underwent suture repair in the ER for her lip laceration. She was placed in a wrist splint and shoulder sling for her left wrist fracture and will undergo evaluation by hand surgery on Monday. She was also evaluated by orthopedic surgery while in the ER regarding her right patellar fracture and will undergo tentative plans for surgical repair on Tuesday, plan to be determined still. She is otherwise resting in bed more comfortable after having received pain medication and assessment of her fractures.  She understands the plan and is amenable.   09/26/19: Seen and examined with her daughter at bedside.  Pain at the fracture sites is worse with movement.  Seen by hand surgery and orthopedic surgeon with plan for surgical intervention tomorrow.  N.p.o. after midnight.  Possible PCA pump postsurgically, at  patient's own request.  Assessment/Plan: Principal Problem:   Multiple fractures Active Problems:   Essential hypertension   Ulcerative colitis ([place])   Biliary cirrhosis (HCC)   Distal radius fracture, left   Patellar fracture  Left distal radius fracture/right patella fracture status post mechanical fall Plan: Surgical intervention tomorrow N.p.o. after midnight Continue pain control and bowel regimen Possible PCA pump post surgery  Management directed by surgeons  Ulcerative colitis/biliary cirrhosis  Followed by GI Has been stable Continue home regimen  Osteoporosis On [place]  GERD Stable Continue PPI   Code Status: Full code  Family Communication: Daughter at bedside    Consultants:  Hand surgery  Orthopedic surgery  Procedures:  None  Antimicrobials:  None  DVT prophylaxis: Subcu Lovenox daily  Status is: Inpatient    Dispo: The patient is from: Home.               Anticipated d/c is to: SNF in the next 48 to 72 hours               Anticipated d/c date is: 09/28/2019               Patient currently awaiting surgical intervention tomorrow          Objective: Vitals:   09/25/19 1700 09/25/19 1753 09/26/19 0003 09/26/19 0615  BP: 140/65 (!) 158/69 (!) 143/79 134/75  Pulse: (!) 59 68 64 66  Resp: 18 16 16  18  Temp:  98.3 F (36.8 C) (!) 97.3 F (36.3 C) 98 F (36.7 C)  TempSrc:  Oral Oral Oral  SpO2: 99% 100% 97% 98%  Weight:      Height:        Intake/Output Summary (Last 24 hours) at 09/26/2019 1224 Last data filed at 09/26/2019 6440 Gross per 24 hour  Intake 1080 ml  Output 900 ml  Net 180 ml   Filed Weights   09/25/19 1042  Weight: 60.3 kg    Exam:  . General: 85 y.o. year-old female well developed well nourished in no acute distress.  Alert and oriented x3. . Cardiovascular: Regular rate and rhythm with no rubs or gallops.  No thyromegaly or JVD noted.   Marland Kitchen Respiratory: Clear to auscultation with no wheezes or  rales. Good inspiratory effort. . Abdomen: Soft nontender nondistended with normal bowel sounds x4 quadrants. . Musculoskeletal: Right knee in immobilizer/stabilizer.  Left wrist in stabilizer.   Marland Kitchen Psychiatry: Mood is appropriate for condition and setting   Data Reviewed: CBC: Recent Labs  Lab 09/25/19 1347 09/26/19 0330  WBC 8.4 8.8  NEUTROABS 6.8  --   HGB 14.1 13.6  HCT 43.0 41.1  MCV 98.2 96.3  PLT 223 347   Basic Metabolic Panel: Recent Labs  Lab 09/25/19 1347 09/26/19 0330  NA 139 138  K 4.2 3.7  CL 108 106  CO2 24 21*  GLUCOSE 100* 137*  BUN 17 13  CREATININE 0.71 0.52  CALCIUM 8.7* 8.7*  MG  --  2.1   GFR: Estimated Creatinine Clearance: 45.6 mL/min (by C-G formula based on SCr of 0.52 mg/dL). Liver Function Tests: No results for input(s): AST, ALT, ALKPHOS, BILITOT, PROT, ALBUMIN in the last 168 hours. No results for input(s): LIPASE, AMYLASE in the last 168 hours. No results for input(s): AMMONIA in the last 168 hours. Coagulation Profile: No results for input(s): INR, PROTIME in the last 168 hours. Cardiac Enzymes: No results for input(s): CKTOTAL, CKMB, CKMBINDEX, TROPONINI in the last 168 hours. BNP (last 3 results) No results for input(s): PROBNP in the last 8760 hours. HbA1C: No results for input(s): HGBA1C in the last 72 hours. CBG: No results for input(s): GLUCAP in the last 168 hours. Lipid Profile: No results for input(s): CHOL, HDL, LDLCALC, TRIG, CHOLHDL, LDLDIRECT in the last 72 hours. Thyroid Function Tests: No results for input(s): TSH, T4TOTAL, FREET4, T3FREE, THYROIDAB in the last 72 hours. Anemia Panel: No results for input(s): VITAMINB12, FOLATE, FERRITIN, TIBC, IRON, RETICCTPCT in the last 72 hours. Urine analysis:    Component Value Date/Time   COLORURINE YELLOW 09/13/2012 [place] 09/13/2012 0832   LABSPEC 1.009 09/13/2012 0832   PHURINE 6.0 09/13/2012 0832   GLUCOSEU NEGATIVE 09/13/2012 0832   HGBUR  NEGATIVE 09/13/2012 0832   BILIRUBINUR NEGATIVE 09/13/2012 0832   KETONESUR NEGATIVE 09/13/2012 0832   PROTEINUR NEGATIVE 09/13/2012 0832   UROBILINOGEN 0.2 09/13/2012 0832   NITRITE NEGATIVE 09/13/2012 0832   LEUKOCYTESUR TRACE (A) 09/13/2012 0832   Sepsis Labs: @LABRCNTIP (procalcitonin:4,lacticidven:4)  ) Recent Results (from the past 240 hour(s))  SARS Coronavirus 2 by RT PCR (hospital order, performed in [facility] hospital lab) Nasopharyngeal Nasopharyngeal Swab     Status: None   Collection Time: 09/25/19  1:47 PM   Specimen: Nasopharyngeal Swab  Result Value Ref Range Status   SARS Coronavirus 2 NEGATIVE NEGATIVE Final    Comment: (NOTE) SARS-CoV-2 target nucleic acids are NOT DETECTED. The SARS-CoV-2 RNA is generally detectable  in upper and lower respiratory specimens during the acute phase of infection. The lowest concentration of SARS-CoV-2 viral copies this assay can detect is 250 copies / mL. A negative result does not preclude SARS-CoV-2 infection and should not be used as the sole basis for treatment or other patient management decisions.  A negative result may occur with improper specimen collection / handling, submission of specimen other than nasopharyngeal swab, presence of viral mutation(s) within the areas targeted by this assay, and inadequate number of viral copies (<250 copies / mL). A negative result must be combined with clinical observations, patient history, and epidemiological information. Fact Sheet for Patients:   StrictlyIdeas.no Fact Sheet for Healthcare Providers: BankingDealers.co.za This test is not yet approved or cleared  by the [place] FDA and has been authorized for detection and/or diagnosis of SARS-CoV-2 by FDA under an Emergency Use Authorization (EUA).  This EUA will remain in effect (meaning this test can be used) for the duration of the COVID-19 declaration under Section 564(b)(1)  of the Act, 21 U.S.C. section 360bbb-3(b)(1), unless the authorization is terminated or revoked sooner. Performed at [facility], [place] [address] [place], [place] [address]       Studies: No results found.  Scheduled Meds: . enoxaparin (LOVENOX) injection  40 mg Subcutaneous Q24H  . fluticasone  1 spray Each Nare Daily  . irbesartan  300 mg Oral QPM  . mesalamine  2.4 g Oral Q breakfast  . multivitamin  1 tablet Oral BID  . pantoprazole  40 mg Oral Daily  . sodium chloride flush  3 mL Intravenous Q12H  . ursodiol  300 mg Oral Q48H  . ursodiol  300 mg Oral QHS  . [START ON 09/27/2019] ursodiol  600 mg Oral Q48H    Continuous Infusions:   LOS: 1 day     Kayleen Memos, MD [facility] Pager [phone]  If 7PM-7AM, please contact night-coverage [email or web address] Password [email or web address] 09/26/2019, 12:24 PM

## 2019-09-26 NOTE — Progress Notes (Signed)
Subjective: Lovely 85 year old female status post mechanical fall.  Suffered a right transverse patella fracture. Patient is doing well this morning.  Still in her knee immobilizer.  No events overnight. Hand surgery PA present in room when I arrived. Daughter also in the room during time of exam.   Objective: Vital signs in last 24 hours: Temp:  [97.3 F (36.3 C)-98.3 F (36.8 C)] 98 F (36.7 C) (05/24 0615) Pulse Rate:  [59-74] 66 (05/24 0615) Resp:  [15-18] 18 (05/24 0615) BP: (134-178)/(65-98) 134/75 (05/24 0615) SpO2:  [97 %-100 %] 98 % (05/24 0615) Weight:  [60.3 kg] 60.3 kg (05/23 1042)  Intake/Output from previous day: 05/23 0701 - 05/24 0700 In: 960 [P.O.:960] Out: 900 [Urine:900] Intake/Output this shift: No intake/output data recorded.  Recent Labs    09/25/19 1347 09/26/19 0330  HGB 14.1 13.6   Recent Labs    09/25/19 1347 09/26/19 0330  WBC 8.4 8.8  RBC 4.38 4.27  HCT 43.0 41.1  PLT 223 206   Recent Labs    09/25/19 1347 09/26/19 0330  NA 139 138  K 4.2 3.7  CL 108 106  CO2 24 21*  BUN 17 13  CREATININE 0.71 0.52  GLUCOSE 100* 137*  CALCIUM 8.7* 8.7*   No results for input(s): LABPT, INR in the last 72 hours.  Patient has tenderness to palpation over the right patella.  Unable to do a straight leg raise.  Calf is supple.  Neurovascularly intact in the right lower extremity.  Abrasion over the anterior aspect of the knee no obvious signs of infection.    Assessment/Plan: Right transverse patella fracture: - patient is going to have a patella ORIF versus patellectomy, I explained to both the patient and her daughter will both of these mean.  -Most likely going to try and do the surgery around the same time as hand team tentatively planned for tomorrow -Dr. Theda Sers and Dr. Percell Miller to discuss -Dr. Theda Sers will come by later today to talk with patient as well.  -Partial weight bearing on right lower extremity with knee immobilizer on -NPO after  midnight for possible surgery tomorrow    Drue Novel, PA-C [facility] 09/26/2019, 8:22 AM

## 2019-09-27 ENCOUNTER — Inpatient Hospital Stay (HOSPITAL_COMMUNITY): Payer: Medicare Other | Admitting: Anesthesiology

## 2019-09-27 ENCOUNTER — Encounter (HOSPITAL_COMMUNITY): Payer: Self-pay | Admitting: Internal Medicine

## 2019-09-27 ENCOUNTER — Encounter (HOSPITAL_COMMUNITY)
Admission: EM | Disposition: A | Discharge status: Skilled Nursing Facility | Payer: Self-pay | Source: Home / Self Care | Attending: Internal Medicine

## 2019-09-27 HISTORY — PX: ORIF WRIST FRACTURE: SHX2133

## 2019-09-27 HISTORY — PX: ORIF PATELLA: SHX5033

## 2019-09-27 LAB — BASIC METABOLIC PANEL
Anion gap: 5 (ref 5–15)
BUN: 11 mg/dL (ref 8–23)
CO2: 26 mmol/L (ref 22–32)
Calcium: 8.5 mg/dL — ABNORMAL LOW (ref 8.9–10.3)
Chloride: 102 mmol/L (ref 98–111)
Creatinine, Ser: 0.56 mg/dL (ref 0.44–1.00)
GFR calc Af Amer: >60 mL/min (ref >60)
GFR calc non Af Amer: >60 mL/min (ref >60)
Glucose, Bld: 117 mg/dL — ABNORMAL HIGH (ref 70–99)
Potassium: 3.9 mmol/L (ref 3.5–5.1)
Sodium: 133 mmol/L — ABNORMAL LOW (ref 135–145)

## 2019-09-27 LAB — TYPE AND SCREEN
ABO/RH(D): O NEG
Antibody Screen: NEGATIVE

## 2019-09-27 LAB — CBC
HCT: 39.2 % (ref 36.0–46.0)
Hemoglobin: 13.1 g/dL (ref 12.0–15.0)
MCH: 32.2 pg (ref 26.0–34.0)
MCHC: 33.4 g/dL (ref 30.0–36.0)
MCV: 96.3 fL (ref 80.0–100.0)
Platelets: 194 10*3/uL (ref 150–400)
RBC: 4.07 MIL/uL (ref 3.87–5.11)
RDW: 13.4 % (ref 11.5–15.5)
WBC: 8.1 10*3/uL (ref 4.0–10.5)
nRBC: 0 % (ref 0.0–0.2)

## 2019-09-27 LAB — PROTIME-INR
INR: 1 (ref 0.8–1.2)
Prothrombin Time: 13.2 seconds (ref 11.4–15.2)

## 2019-09-27 LAB — ABO/RH: ABO/RH(D): O NEG

## 2019-09-27 SURGERY — OPEN REDUCTION INTERNAL FIXATION (ORIF) PATELLA
Anesthesia: Choice | Site: Knee | Laterality: Right

## 2019-09-27 SURGERY — OPEN REDUCTION INTERNAL FIXATION (ORIF) WRIST FRACTURE
Anesthesia: General | Site: Wrist | Laterality: Right

## 2019-09-27 MED ORDER — MIDAZOLAM HCL 2 MG/2ML IJ SOLN
INTRAMUSCULAR | Status: AC
  Filled 2019-09-27: qty 2

## 2019-09-27 MED ORDER — LIDOCAINE 2% (20 MG/ML) 5 ML SYRINGE
INTRAMUSCULAR | Status: AC
  Filled 2019-09-27: qty 5

## 2019-09-27 MED ORDER — ONDANSETRON HCL 4 MG/2ML IJ SOLN
INTRAMUSCULAR | Status: DC | PRN
  Administered 2019-09-27: 4 mg via INTRAVENOUS

## 2019-09-27 MED ORDER — PHENYLEPHRINE HCL (PRESSORS) 10 MG/ML IV SOLN
INTRAVENOUS | Status: DC | PRN
  Administered 2019-09-27 (×2): 120 ug via INTRAVENOUS

## 2019-09-27 MED ORDER — FENTANYL CITRATE (PF) 100 MCG/2ML IJ SOLN
INTRAMUSCULAR | Status: AC
  Administered 2019-09-27: 25 ug
  Administered 2019-09-27: 50 ug
  Filled 2019-09-27: qty 2

## 2019-09-27 MED ORDER — SUGAMMADEX SODIUM 200 MG/2ML IV SOLN
INTRAVENOUS | Status: DC | PRN
  Administered 2019-09-27: 150 mg via INTRAVENOUS

## 2019-09-27 MED ORDER — METHOCARBAMOL 500 MG PO TABS
500.0000 mg | ORAL_TABLET | Freq: Four times a day (QID) | ORAL | Status: DC | PRN
  Administered 2019-09-27 – 2019-09-29 (×4): 500 mg via ORAL
  Filled 2019-09-27 (×4): qty 1

## 2019-09-27 MED ORDER — LACTATED RINGERS IV SOLN: INTRAVENOUS | Status: AC

## 2019-09-27 MED ORDER — DEXAMETHASONE SODIUM PHOSPHATE 10 MG/ML IJ SOLN
INTRAMUSCULAR | Status: AC
  Filled 2019-09-27: qty 1

## 2019-09-27 MED ORDER — FENTANYL CITRATE (PF) 100 MCG/2ML IJ SOLN: 25.0000 ug | INTRAMUSCULAR | Status: DC | PRN

## 2019-09-27 MED ORDER — LIDOCAINE 2% (20 MG/ML) 5 ML SYRINGE
INTRAMUSCULAR | Status: DC | PRN
  Administered 2019-09-27: 40 mg via INTRAVENOUS

## 2019-09-27 MED ORDER — ONDANSETRON HCL 4 MG/2ML IJ SOLN
INTRAMUSCULAR | Status: AC
  Filled 2019-09-27: qty 2

## 2019-09-27 MED ORDER — METHOCARBAMOL 1000 MG/10ML IJ SOLN
500.0000 mg | Freq: Four times a day (QID) | INTRAVENOUS | Status: DC | PRN
  Filled 2019-09-27: qty 5

## 2019-09-27 MED ORDER — PHENYLEPHRINE HCL-NACL 10-0.9 MG/250ML-% IV SOLN
INTRAVENOUS | Status: DC | PRN
Start: 2019-09-27 — End: 2019-09-27
  Administered 2019-09-27: 35 ug/min via INTRAVENOUS

## 2019-09-27 MED ORDER — ACETAMINOPHEN 500 MG PO TABS
500.0000 mg | ORAL_TABLET | Freq: Four times a day (QID) | ORAL | Status: AC
  Administered 2019-09-27 – 2019-09-28 (×3): 500 mg via ORAL
  Filled 2019-09-27 (×3): qty 1

## 2019-09-27 MED ORDER — ACETAMINOPHEN 500 MG PO TABS
1000.0000 mg | ORAL_TABLET | ORAL | Status: AC
  Administered 2019-09-27: 1000 mg via ORAL
  Filled 2019-09-27: qty 2

## 2019-09-27 MED ORDER — ONDANSETRON HCL 4 MG/2ML IJ SOLN: 4.0000 mg | Freq: Once | INTRAMUSCULAR | Status: DC | PRN

## 2019-09-27 MED ORDER — CEFAZOLIN SODIUM-DEXTROSE 1-4 GM/50ML-% IV SOLN
1.0000 g | Freq: Four times a day (QID) | INTRAVENOUS | Status: AC
  Administered 2019-09-27 – 2019-09-28 (×3): 1 g via INTRAVENOUS
  Filled 2019-09-27 (×3): qty 50

## 2019-09-27 MED ORDER — MORPHINE SULFATE (PF) 2 MG/ML IV SOLN
2.0000 mg | INTRAVENOUS | Status: DC | PRN
  Administered 2019-09-28 (×2): 2 mg via INTRAVENOUS
  Filled 2019-09-27 (×2): qty 1

## 2019-09-27 MED ORDER — PHENYLEPHRINE HCL (PRESSORS) 10 MG/ML IV SOLN
INTRAVENOUS | Status: AC
  Filled 2019-09-27: qty 1

## 2019-09-27 MED ORDER — HYDROCODONE-ACETAMINOPHEN 5-325 MG PO TABS: 1.0000 | ORAL_TABLET | Freq: Four times a day (QID) | ORAL | 0 refills | Status: DC | PRN

## 2019-09-27 MED ORDER — PROPOFOL 10 MG/ML IV BOLUS
INTRAVENOUS | Status: AC
  Filled 2019-09-27: qty 20

## 2019-09-27 MED ORDER — ONDANSETRON HCL 4 MG PO TABS: 4.0000 mg | ORAL_TABLET | Freq: Four times a day (QID) | ORAL | Status: DC | PRN

## 2019-09-27 MED ORDER — ENOXAPARIN SODIUM 40 MG/0.4ML ~~LOC~~ SOLN
40.0000 mg | SUBCUTANEOUS | 0 refills | Status: DC
Start: 2019-09-27 — End: 2019-09-29

## 2019-09-27 MED ORDER — FENTANYL CITRATE (PF) 100 MCG/2ML IJ SOLN: 50.0000 ug | INTRAMUSCULAR | Status: DC

## 2019-09-27 MED ORDER — FENTANYL CITRATE (PF) 100 MCG/2ML IJ SOLN
INTRAMUSCULAR | Status: DC | PRN
  Administered 2019-09-27: 50 ug via INTRAVENOUS
  Administered 2019-09-27: 25 ug via INTRAVENOUS
  Administered 2019-09-27: 50 ug via INTRAVENOUS
  Administered 2019-09-27: 25 ug via INTRAVENOUS

## 2019-09-27 MED ORDER — DOCUSATE SODIUM 100 MG PO CAPS
100.0000 mg | ORAL_CAPSULE | Freq: Two times a day (BID) | ORAL | Status: DC
  Administered 2019-09-27 – 2019-09-29 (×4): 100 mg via ORAL
  Filled 2019-09-27 (×4): qty 1

## 2019-09-27 MED ORDER — ROCURONIUM BROMIDE 10 MG/ML (PF) SYRINGE
PREFILLED_SYRINGE | INTRAVENOUS | Status: DC | PRN
  Administered 2019-09-27: 50 mg via INTRAVENOUS

## 2019-09-27 MED ORDER — FENTANYL CITRATE (PF) 100 MCG/2ML IJ SOLN
INTRAMUSCULAR | Status: AC
  Filled 2019-09-27: qty 2

## 2019-09-27 MED ORDER — OXYCODONE HCL 5 MG PO TABS
5.0000 mg | ORAL_TABLET | ORAL | Status: DC | PRN
  Administered 2019-09-28 – 2019-09-29 (×6): 5 mg via ORAL
  Filled 2019-09-27 (×6): qty 1

## 2019-09-27 MED ORDER — ROPIVACAINE HCL 7.5 MG/ML IJ SOLN
INTRAMUSCULAR | Status: DC | PRN
  Administered 2019-09-27: 10 mL via PERINEURAL
  Administered 2019-09-27: 15 mL via PERINEURAL

## 2019-09-27 MED ORDER — ROCURONIUM BROMIDE 10 MG/ML (PF) SYRINGE
PREFILLED_SYRINGE | INTRAVENOUS | Status: AC
  Filled 2019-09-27: qty 10

## 2019-09-27 MED ORDER — SODIUM CHLORIDE 0.9 % IR SOLN
Status: DC | PRN
  Administered 2019-09-27: 1000 mL

## 2019-09-27 MED ORDER — ONDANSETRON HCL 4 MG/2ML IJ SOLN: 4.0000 mg | Freq: Four times a day (QID) | INTRAMUSCULAR | Status: DC | PRN

## 2019-09-27 MED ORDER — DEXAMETHASONE SODIUM PHOSPHATE 10 MG/ML IJ SOLN
INTRAMUSCULAR | Status: DC | PRN
  Administered 2019-09-27: 5 mg via INTRAVENOUS

## 2019-09-27 MED ORDER — LACTATED RINGERS IV SOLN: INTRAVENOUS | Status: DC

## 2019-09-27 MED ORDER — PROPOFOL 10 MG/ML IV BOLUS
INTRAVENOUS | Status: DC | PRN
  Administered 2019-09-27: 80 mg via INTRAVENOUS
  Administered 2019-09-27: 40 mg via INTRAVENOUS

## 2019-09-27 MED ORDER — MIDAZOLAM HCL 2 MG/2ML IJ SOLN: 1.0000 mg | INTRAMUSCULAR | Status: DC

## 2019-09-27 SURGICAL SUPPLY — 86 items
BAG ZIPLOCK 12X15 (MISCELLANEOUS) ×4 IMPLANT
BIT DRILL 2.2 SS TIBIAL (BIT) ×2 IMPLANT
BIT DRILL 2.6 CANN (BIT) ×2 IMPLANT
BLADE SAW SGTL 11.0X1.19X90.0M (BLADE) IMPLANT
BNDG COHESIVE 6X5 TAN STRL LF (GAUZE/BANDAGES/DRESSINGS) ×4 IMPLANT
BNDG CONFORM 3 STRL LF (GAUZE/BANDAGES/DRESSINGS) ×2 IMPLANT
BNDG ELASTIC 3X5.8 VLCR STR LF (GAUZE/BANDAGES/DRESSINGS) ×2 IMPLANT
BNDG ELASTIC 4X5.8 VLCR STR LF (GAUZE/BANDAGES/DRESSINGS) ×4 IMPLANT
BNDG ELASTIC 6X10 VLCR STRL LF (GAUZE/BANDAGES/DRESSINGS) ×2 IMPLANT
BNDG GAUZE ELAST 4 BULKY (GAUZE/BANDAGES/DRESSINGS) ×2 IMPLANT
CLOSURE WOUND 1/2 X4 (GAUZE/BANDAGES/DRESSINGS) ×2
CLOTH BEACON ORANGE TIMEOUT ST (SAFETY) ×4 IMPLANT
CORD BIPOLAR FORCEPS 12FT (ELECTRODE) ×4 IMPLANT
COVER SURGICAL LIGHT HANDLE (MISCELLANEOUS) ×4 IMPLANT
COVER WAND RF STERILE (DRAPES) ×4 IMPLANT
CUFF TOURN SGL QUICK 18X4 (TOURNIQUET CUFF) ×4 IMPLANT
CUFF TOURN SGL QUICK 34 (TOURNIQUET CUFF) ×4
CUFF TOURN SGL QUICK 42 (TOURNIQUET CUFF) IMPLANT
CUFF TRNQT CYL 34X4.125X (TOURNIQUET CUFF) IMPLANT
DECANTER SPIKE VIAL GLASS SM (MISCELLANEOUS) ×2 IMPLANT
DERMABOND ADVANCED (GAUZE/BANDAGES/DRESSINGS)
DERMABOND ADVANCED .7 DNX12 (GAUZE/BANDAGES/DRESSINGS) ×4 IMPLANT
DRAPE OEC MINIVIEW 54X84 (DRAPES) ×6 IMPLANT
DRAPE U-SHAPE 47X51 STRL (DRAPES) ×4 IMPLANT
DRSG AQUACEL AG ADV 3.5X10 (GAUZE/BANDAGES/DRESSINGS) ×2 IMPLANT
DRSG EMULSION OIL 3X3 NADH (GAUZE/BANDAGES/DRESSINGS) ×2 IMPLANT
DRSG PAD ABDOMINAL 8X10 ST (GAUZE/BANDAGES/DRESSINGS) ×4 IMPLANT
DURAPREP 26ML APPLICATOR (WOUND CARE) ×6 IMPLANT
ELECT REM PT RETURN 15FT ADLT (MISCELLANEOUS) ×4 IMPLANT
FIBERTAPE 2 W/STRL NDL 17 (SUTURE) ×2 IMPLANT
GAUZE 4X4 16PLY RFD (DISPOSABLE) ×4 IMPLANT
GAUZE SPONGE 4X4 12PLY STRL (GAUZE/BANDAGES/DRESSINGS) ×4 IMPLANT
GLOVE BIO SURGEON STRL SZ7.5 (GLOVE) ×8 IMPLANT
GLOVE BIOGEL M 7.0 STRL (GLOVE) IMPLANT
GLOVE BIOGEL PI IND STRL 7.5 (GLOVE) ×2 IMPLANT
GLOVE BIOGEL PI IND STRL 8 (GLOVE) ×4 IMPLANT
GLOVE BIOGEL PI IND STRL 8.5 (GLOVE) ×2 IMPLANT
GLOVE BIOGEL PI INDICATOR 7.5 (GLOVE) ×2
GLOVE BIOGEL PI INDICATOR 8 (GLOVE) ×4
GLOVE BIOGEL PI INDICATOR 8.5 (GLOVE) ×2
GLOVE ECLIPSE 8.0 STRL XLNG CF (GLOVE) IMPLANT
GLOVE ORTHO TXT STRL SZ7.5 (GLOVE) ×8 IMPLANT
GLOVE SURG ORTHO 8.0 STRL STRW (GLOVE) ×4 IMPLANT
GOWN STRL REUS W/TWL LRG LVL3 (GOWN DISPOSABLE) ×4 IMPLANT
GOWN STRL REUS W/TWL XL LVL3 (GOWN DISPOSABLE) ×8 IMPLANT
GUIDEWIRE 1.35MM (WIRE) ×4 IMPLANT
IMMOBILIZER KNEE 20 (SOFTGOODS) ×4 IMPLANT
IMMOBILIZER KNEE 20 THIGH 36 (SOFTGOODS) IMPLANT
K-WIRE 1.6 (WIRE) ×4
K-WIRE FX5X1.6XNS BN SS (WIRE) ×2
KIT BASIN (CUSTOM PROCEDURE TRAY) ×4 IMPLANT
KIT TURNOVER KIT A (KITS) ×2 IMPLANT
KWIRE FX5X1.6XNS BN SS (WIRE) IMPLANT
MANIFOLD NEPTUNE II (INSTRUMENTS) ×4 IMPLANT
NS IRRIG 1000ML POUR BTL (IV SOLUTION) ×4 IMPLANT
PACK ORTHO EXTREMITY (CUSTOM PROCEDURE TRAY) ×4 IMPLANT
PACK TOTAL KNEE CUSTOM (KITS) ×4 IMPLANT
PAD CAST 4YDX4 CTTN HI CHSV (CAST SUPPLIES) ×2 IMPLANT
PADDING CAST COTTON 4X4 STRL (CAST SUPPLIES) ×4
PADDING CAST COTTON 6X4 STRL (CAST SUPPLIES) ×2 IMPLANT
PASSER SUT SWANSON 36MM LOOP (INSTRUMENTS) ×2 IMPLANT
PEG LOCKING SMOOTH 2.2X16 (Screw) ×6 IMPLANT
PEG LOCKING SMOOTH 2.2X18 (Peg) ×6 IMPLANT
PENCIL SMOKE EVACUATOR (MISCELLANEOUS) IMPLANT
PLATE NARROW DVR LEFT (Plate) ×2 IMPLANT
PROTECTOR NERVE ULNAR (MISCELLANEOUS) ×4 IMPLANT
SCREW CANN 4.0X35 (Screw) ×4 IMPLANT
SCREW LOCK 14X2.7X 3 LD TPR (Screw) IMPLANT
SCREW LOCKING 2.7X14 (Screw) ×8 IMPLANT
SCREW LOCKING 2.7X15MM (Screw) ×2 IMPLANT
STAPLER VISISTAT (STAPLE) ×2 IMPLANT
STRIP CLOSURE SKIN 1/2X4 (GAUZE/BANDAGES/DRESSINGS) ×2 IMPLANT
SUT ETHIBOND NAB CT1 #1 30IN (SUTURE) ×8 IMPLANT
SUT FIBERWIRE #2 38 T-5 BLUE (SUTURE) ×8
SUT VIC AB 0 CT1 27 (SUTURE) ×8
SUT VIC AB 0 CT1 27XBRD ANTBC (SUTURE) ×4 IMPLANT
SUT VIC AB 1 CT1 27 (SUTURE) ×8
SUT VIC AB 1 CT1 27XBRD ANTBC (SUTURE) ×4 IMPLANT
SUT VIC AB 2-0 CT1 27 (SUTURE) ×8
SUT VIC AB 2-0 CT1 TAPERPNT 27 (SUTURE) ×4 IMPLANT
SUT VIC AB 4-0 PS2 27 (SUTURE) ×4 IMPLANT
SUTURE FIBERWR #2 38 T-5 BLUE (SUTURE) ×4 IMPLANT
TOWEL OR 17X26 10 PK STRL BLUE (TOWEL DISPOSABLE) ×4 IMPLANT
UNDERPAD 30X36 HEAVY ABSORB (UNDERPADS AND DIAPERS) ×4 IMPLANT
WATER STERILE IRR 1000ML POUR (IV SOLUTION) ×2 IMPLANT
WRAP KNEE MAXI GEL POST OP (GAUZE/BANDAGES/DRESSINGS) ×2 IMPLANT

## 2019-09-27 NOTE — Transfer of Care (Signed)
Immediate Anesthesia Transfer of Care Note  Patient: KAYLY KRIEGEL  Procedure(s) Performed: OPEN REDUCTION INTERNAL FIXATION (ORIF) WRIST FRACTURE (Left Wrist) OPEN REDUCTION INTERNAL (ORIF) FIXATION PATELLA (Right Knee)  Patient Location: PACU  Anesthesia Type:General  Level of Consciousness: awake, alert  and oriented  Airway & Oxygen Therapy: Patient Spontanous Breathing and Patient connected to face mask oxygen  Post-op Assessment: Report given to RN and Post -op Vital signs reviewed and stable  Post vital signs: Reviewed and stable  Last Vitals:  Vitals Value Taken Time  BP 147/78 09/27/19 1330  Temp    Pulse 80 09/27/19 1333  Resp 25 09/27/19 1333  SpO2 100 % 09/27/19 1333  Vitals shown include unvalidated device data.  Last Pain:  Vitals:   09/27/19 1045  TempSrc:   PainSc: 0-No pain      Patients Stated Pain Goal: 1 (25/83/46 2194)  Complications: No apparent anesthesia complications

## 2019-09-27 NOTE — Anesthesia Procedure Notes (Signed)
Procedure Name: Intubation Date/Time: 09/27/2019 11:34 AM Performed by: Maxwell Caul, CRNA Pre-anesthesia Checklist: Patient identified, Emergency Drugs available, Suction available and Patient being monitored Patient Re-evaluated:Patient Re-evaluated prior to induction Oxygen Delivery Method: Circle system utilized Preoxygenation: Pre-oxygenation with 100% oxygen Induction Type: IV induction Ventilation: Mask ventilation without difficulty Laryngoscope Size: Mac and 4 Grade View: Grade I Tube type: Oral Tube size: 7.0 mm Number of attempts: 1 Airway Equipment and Method: Stylet Placement Confirmation: ETT inserted through vocal cords under direct vision,  positive ETCO2 and breath sounds checked- equal and bilateral Secured at: 21 cm Tube secured with: Tape Dental Injury: Teeth and Oropharynx as per pre-operative assessment

## 2019-09-27 NOTE — Anesthesia Preprocedure Evaluation (Addendum)
Anesthesia Evaluation  Patient identified by MRN, date of birth, ID band Patient awake    Reviewed: Allergy & Precautions, NPO status , Patient's Chart, lab work & pertinent test results  Airway Mallampati: II  TM Distance: >3 FB Neck ROM: Full    Dental  (+) Teeth Intact, Dental Advisory Given   Pulmonary former smoker,    Pulmonary exam normal breath sounds clear to auscultation       Cardiovascular hypertension, Pt. on medications Normal cardiovascular exam Rhythm:Regular Rate:Normal     Neuro/Psych negative neurological ROS  negative psych ROS   GI/Hepatic hiatal hernia, PUD, GERD  Medicated,(+) Cirrhosis       , H/o colon cancer    Endo/Other  negative endocrine ROS  Renal/GU negative Renal ROS     Musculoskeletal  (+) Arthritis ,   Abdominal   Peds  Hematology negative hematology ROS (+)   Anesthesia Other Findings Day of surgery medications reviewed with the patient.  Reproductive/Obstetrics                           Anesthesia Physical Anesthesia Plan  ASA: II  Anesthesia Plan: General   Post-op Pain Management:  Regional for Post-op pain   Induction: Intravenous  PONV Risk Score and Plan: 3 and Dexamethasone and Ondansetron  Airway Management Planned: Oral ETT  Additional Equipment:   Intra-op Plan:   Post-operative Plan: Extubation in OR  Informed Consent: I have reviewed the patients History and Physical, chart, labs and discussed the procedure including the risks, benefits and alternatives for the proposed anesthesia with the patient or authorized representative who has indicated his/her understanding and acceptance.     Dental advisory given  Plan Discussed with: CRNA  Anesthesia Plan Comments:         Anesthesia Quick Evaluation

## 2019-09-27 NOTE — Anesthesia Procedure Notes (Signed)
Anesthesia Regional Block: Femoral nerve block   Pre-Anesthetic Checklist: ,, timeout performed, Correct Patient, Correct Site, Correct Laterality, Correct Procedure, Correct Position, site marked, Risks and benefits discussed,  Surgical consent,  Pre-op evaluation,  At surgeon's request and post-op pain management  Laterality: Right  Prep: chloraprep       Needles:  Injection technique: Single-shot  Needle Type: Echogenic Needle     Needle Length: 9cm  Needle Gauge: 21     Additional Needles:   Procedures:,,,, ultrasound used (permanent image in chart),,,,  Narrative:  Start time: 09/27/2019 10:29 AM End time: 09/27/2019 10:34 AM Injection made incrementally with aspirations every 5 mL.  Performed by: Personally  Anesthesiologist: Catalina Gravel, MD  Additional Notes: No pain on injection. No increased resistance to injection. Injection made in 5cc increments.  Good needle visualization.  Patient tolerated procedure well.

## 2019-09-27 NOTE — Interval H&P Note (Signed)
I participated in the care of this patient and agree with the above history, physical and evaluation. I performed a review of the history and a physical exam as detailed   Carole Binning MD

## 2019-09-27 NOTE — Discharge Instructions (Signed)
Diet: As you were doing prior to hospitalization   Dressing:  Keep dressings on and dry until follow up.  Weight Bearing:    As tolerated right leg with leg straight in knee immobilizer at all times.   NWB left hand.  [place] for weight bearing to elbow.  To prevent constipation: you may use a stool softener such as -  Colace (over the counter) 100 mg by mouth twice a day  Drink plenty of fluids (prune juice may be helpful) and high fiber foods Miralax (over the counter) for constipation as needed.    Itching:  If you experience itching with your medications, try taking only a single pain pill, or even half a pain pill at a time.  You can also use benadryl over the counter for itching or also to help with sleep.   Precautions:  If you experience chest pain or shortness of breath - call 911 immediately for transfer to the hospital emergency department!!                                     Follow- Up Appointment:  Please call for an appointment to be seen in 10-14 days [place] - [phone]

## 2019-09-27 NOTE — Anesthesia Postprocedure Evaluation (Signed)
Anesthesia Post Note  Patient: Heidi [name]  Procedure(s) Performed: OPEN REDUCTION INTERNAL FIXATION (ORIF) WRIST FRACTURE (Left Wrist) OPEN REDUCTION INTERNAL (ORIF) FIXATION PATELLA (Right Knee)     Patient location during evaluation: PACU Anesthesia Type: General Level of consciousness: awake and alert, awake and oriented Pain management: pain level controlled Vital Signs Assessment: post-procedure vital signs reviewed and stable Respiratory status: spontaneous breathing, nonlabored ventilation, respiratory function stable and patient connected to nasal cannula oxygen Cardiovascular status: blood pressure returned to baseline and stable Postop Assessment: no apparent nausea or vomiting Anesthetic complications: no    Last Vitals:  Vitals:   09/27/19 1330 09/27/19 1345  BP: (!) 147/78 135/69  Pulse: 78 71  Resp: 15 14  Temp: 36.6 C   SpO2: 100% 99%    Last Pain:  Vitals:   09/27/19 1345  TempSrc:   PainSc: 0-No pain                 Catalina Gravel

## 2019-09-27 NOTE — Interval H&P Note (Signed)
I participated in the care of this patient and agree with the above history, physical and evaluation. I performed a review of the history and a physical exam as detailed   I have discussed the risks of ORIF of the patella with capsular repair and ORIF of her wrist. Both patient and daughter wish to proceed.   Carole Binning MD

## 2019-09-27 NOTE — Plan of Care (Signed)

## 2019-09-27 NOTE — Progress Notes (Signed)
Assisted Dr. Gifford Shave with right ultrasound guided femoral block & a left ultrasound guided axillary block. Side rails up, monitors on throughout procedure. See vital signs in flow sheet. Tolerated Procedure well.

## 2019-09-27 NOTE — Progress Notes (Signed)
PROGRESS NOTE  Heidi [name] ION:[id number] DOB: [birth year] DOA: 09/25/2019 PCP: Crist Infante, MD  HPI/Recap of past 24 hours: Heidi [name] is an 85 yo CF with PMH arthritis, UC (left sided, dx 14 yrs ago, treated with mesalamine), osteoporosis, HLD, HTN, macular degeneration, GERD, diverticulosis, PBC (on ursodiol) who came to the ER after suffering a fall earlier today. She was [place] the dog park and accidentally tripped and fell to the concrete ground she was on.  She says she was not really able to brace her fall and as she fell she hit her knees on the ground, right harder than left and then fell onto her left wrist, and then struck her face on the ground.  As she stood up she noticed immediate blood from her lip.  She was able to make it back to her car and even drive home, however by this time she could barely bear weight on her right lower extremity and her left wrist had begun swelling.  She presented to the ER for further evaluation after her fall. She underwent multiple x-rays and CTs.  She was found to have a right patellar fracture and left distal radius fracture as well as a lacerated lip.  She underwent suture repair in the ER for her lip laceration. She was placed in a wrist splint and shoulder sling for her left wrist fracture and will undergo evaluation by hand surgery on Monday. She was also evaluated by orthopedic surgery while in the ER regarding her right patellar fracture and will undergo tentative plans for surgical repair on Tuesday, plan to be determined still. She is otherwise resting in bed more comfortable after having received pain medication and assessment of her fractures.  She understands the plan and is amenable.   09/27/19:  Seen and examined with her daughter at her bedside.  No acute issues.  Plan surgeries today.  POD#0 post ORIF left wrist and ORIF R patella.    Assessment/Plan: Principal Problem:   Multiple fractures Active Problems:   Essential  hypertension   Ulcerative colitis (HCC)   Biliary cirrhosis (HCC)   Distal radius fracture, left   Patellar fracture  Left distal radius fracture/right patella fracture status post mechanical fall POD#0 post ORIF left wrist and ORIF R patella.  Pain management per surgery Monitor VS, H&H and electrolytes, treat as indicated Repeat labs in the AM  Ulcerative colitis/biliary cirrhosis  Followed by GI Has been stable Continue home regimen  Osteoporosis On Boniva  GERD Stable Continue PPI   Code Status: Full code  Family Communication: Daughter at bedside    Consultants:  Hand surgery  Orthopedic surgery  Procedures:  None  Antimicrobials:  None  DVT prophylaxis: Subcu Lovenox daily  Status is: Inpatient    Dispo: The patient is from: Home.               Anticipated d/c is to: SNF in the next 48 to 72 hours               Anticipated d/c date is: 09/29/2019               Patient currently awaiting Rehab placement post surgical intervention         Objective: Vitals:   09/27/19 1430 09/27/19 1453 09/27/19 1616 09/27/19 1706  BP: 134/67 (!) 143/63 135/68 (!) 145/67  Pulse: 66 68 66 86  Resp: 16 16 16 14   Temp: 97.7 F (36.5 C) 97.7 F (36.5 C) 97.6 F (  36.4 C) 98.3 F (36.8 C)  TempSrc:  Oral  Oral  SpO2: 99% 100% 100% 100%  Weight:      Height:        Intake/Output Summary (Last 24 hours) at 09/27/2019 1719 Last data filed at 09/27/2019 1648 Gross per 24 hour  Intake 1260.68 ml  Output 1475 ml  Net -214.32 ml   Filed Weights   09/25/19 1042 09/27/19 1008  Weight: 60.3 kg 60.3 kg    Exam: No changes from prior exam.  . General: 85 y.o. year-old female well developed well nourished in no acute distress.  Alert and oriented x3. . Cardiovascular: Regular rate and rhythm with no rubs or gallops.  No thyromegaly or JVD noted.   Marland Kitchen Respiratory: Clear to auscultation with no wheezes or rales. Good inspiratory effort. . Abdomen: Soft  nontender nondistended with normal bowel sounds x4 quadrants. . Musculoskeletal: Right knee in immobilizer/stabilizer.  Left wrist in stabilizer.   Marland Kitchen Psychiatry: Mood is appropriate for condition and setting   Data Reviewed: CBC: Recent Labs  Lab 09/25/19 1347 09/26/19 0330 09/27/19 0314  WBC 8.4 8.8 8.1  NEUTROABS 6.8  --   --   HGB 14.1 13.6 13.1  HCT 43.0 41.1 39.2  MCV 98.2 96.3 96.3  PLT 223 206 751   Basic Metabolic Panel: Recent Labs  Lab 09/25/19 1347 09/26/19 0330 09/27/19 0314  NA 139 138 133*  K 4.2 3.7 3.9  CL 108 106 102  CO2 24 21* 26  GLUCOSE 100* 137* 117*  BUN 17 13 11   CREATININE 0.71 0.52 0.56  CALCIUM 8.7* 8.7* 8.5*  MG  --  2.1  --    GFR: Estimated Creatinine Clearance: 45.6 mL/min (by C-G formula based on SCr of 0.56 mg/dL). Liver Function Tests: No results for input(s): AST, ALT, ALKPHOS, BILITOT, PROT, ALBUMIN in the last 168 hours. No results for input(s): LIPASE, AMYLASE in the last 168 hours. No results for input(s): AMMONIA in the last 168 hours. Coagulation Profile: Recent Labs  Lab 09/27/19 0314  INR 1.0   Cardiac Enzymes: No results for input(s): CKTOTAL, CKMB, CKMBINDEX, TROPONINI in the last 168 hours. BNP (last 3 results) No results for input(s): PROBNP in the last 8760 hours. HbA1C: No results for input(s): HGBA1C in the last 72 hours. CBG: No results for input(s): GLUCAP in the last 168 hours. Lipid Profile: No results for input(s): CHOL, HDL, LDLCALC, TRIG, CHOLHDL, LDLDIRECT in the last 72 hours. Thyroid Function Tests: No results for input(s): TSH, T4TOTAL, FREET4, T3FREE, THYROIDAB in the last 72 hours. Anemia Panel: No results for input(s): VITAMINB12, FOLATE, FERRITIN, TIBC, IRON, RETICCTPCT in the last 72 hours. Urine analysis:    Component Value Date/Time   COLORURINE YELLOW 09/13/2012 [place] 09/13/2012 0832   LABSPEC 1.009 09/13/2012 0832   PHURINE 6.0 09/13/2012 0832   GLUCOSEU  NEGATIVE 09/13/2012 0832   HGBUR NEGATIVE 09/13/2012 0832   BILIRUBINUR NEGATIVE 09/13/2012 0832   KETONESUR NEGATIVE 09/13/2012 0832   PROTEINUR NEGATIVE 09/13/2012 0832   UROBILINOGEN 0.2 09/13/2012 0832   NITRITE NEGATIVE 09/13/2012 0832   LEUKOCYTESUR TRACE (A) 09/13/2012 0832   Sepsis Labs: @LABRCNTIP (procalcitonin:4,lacticidven:4)  ) Recent Results (from the past 240 hour(s))  SARS Coronavirus 2 by RT PCR (hospital order, performed in [facility] hospital lab) Nasopharyngeal Nasopharyngeal Swab     Status: None   Collection Time: 09/25/19  1:47 PM   Specimen: Nasopharyngeal Swab  Result Value Ref Range Status   SARS  Coronavirus 2 NEGATIVE NEGATIVE Final    Comment: (NOTE) SARS-CoV-2 target nucleic acids are NOT DETECTED. The SARS-CoV-2 RNA is generally detectable in upper and lower respiratory specimens during the acute phase of infection. The lowest concentration of SARS-CoV-2 viral copies this assay can detect is 250 copies / mL. A negative result does not preclude SARS-CoV-2 infection and should not be used as the sole basis for treatment or other patient management decisions.  A negative result may occur with improper specimen collection / handling, submission of specimen other than nasopharyngeal swab, presence of viral mutation(s) within the areas targeted by this assay, and inadequate number of viral copies (<250 copies / mL). A negative result must be combined with clinical observations, patient history, and epidemiological information. Fact Sheet for Patients:   StrictlyIdeas.no Fact Sheet for Healthcare Providers: BankingDealers.co.za This test is not yet approved or cleared  by the [place] FDA and has been authorized for detection and/or diagnosis of SARS-CoV-2 by FDA under an Emergency Use Authorization (EUA).  This EUA will remain in effect (meaning this test can be used) for the duration of the COVID-19  declaration under Section 564(b)(1) of the Act, 21 U.S.C. section 360bbb-3(b)(1), unless the authorization is terminated or revoked sooner. Performed at [facility], [place] [address] [place], [place] [address]   MRSA PCR Screening     Status: None   Collection Time: 09/26/19  6:16 PM   Specimen: Nasal Mucosa; Nasopharyngeal  Result Value Ref Range Status   MRSA by PCR NEGATIVE NEGATIVE Final    Comment:        The GeneXpert MRSA Assay (FDA approved for NASAL specimens only), is one component of a comprehensive MRSA colonization surveillance program. It is not intended to diagnose MRSA infection nor to guide or monitor treatment for MRSA infections. Performed at [facility], [place] [address], [place], [place] [address]       Studies: No results found.  Scheduled Meds: . acetaminophen  500 mg Oral Q6H  . docusate sodium  100 mg Oral BID  . enoxaparin (LOVENOX) injection  40 mg Subcutaneous Q24H  . fluticasone  1 spray Each Nare Daily  . irbesartan  300 mg Oral QPM  . mesalamine  2.4 g Oral Q breakfast  . multivitamin  1 tablet Oral BID  . pantoprazole  40 mg Oral Daily  . senna-docusate  2 tablet Oral BID  . sodium chloride flush  3 mL Intravenous Q12H  . ursodiol  300 mg Oral Q48H  . ursodiol  300 mg Oral QHS  . ursodiol  600 mg Oral Q48H    Continuous Infusions: .  ceFAZolin (ANCEF) IV    . lactated ringers 75 mL/hr at 09/27/19 1500  . methocarbamol (ROBAXIN) IV       LOS: 2 days     Kayleen Memos, MD [facility] Pager [phone]  If 7PM-7AM, please contact night-coverage [email or web address] Password [facility] 09/27/2019, 5:19 PM

## 2019-09-27 NOTE — Anesthesia Procedure Notes (Signed)
Anesthesia Regional Block: Axillary brachial plexus block   Pre-Anesthetic Checklist: ,, timeout performed, Correct Patient, Correct Site, Correct Laterality, Correct Procedure, Correct Position, site marked, Risks and benefits discussed,  Surgical consent,  Pre-op evaluation,  At surgeon's request and post-op pain management  Laterality: Left  Prep: chloraprep       Needles:  Injection technique: Single-shot  Needle Type: Echogenic Needle     Needle Length: 9cm  Needle Gauge: 21     Additional Needles:   Procedures:,,,, ultrasound used (permanent image in chart),,,,  Narrative:  Start time: 09/27/2019 10:35 AM End time: 09/27/2019 10:40 AM Injection made incrementally with aspirations every 5 mL.  Performed by: Personally  Anesthesiologist: Catalina Gravel, MD  Additional Notes: No pain on injection. No increased resistance to injection. Injection made in 5cc increments.  Good needle visualization.  Patient tolerated procedure well.

## 2019-09-28 LAB — BASIC METABOLIC PANEL
Anion gap: 8 (ref 5–15)
BUN: 13 mg/dL (ref 8–23)
CO2: 23 mmol/L (ref 22–32)
Calcium: 8.3 mg/dL — ABNORMAL LOW (ref 8.9–10.3)
Chloride: 104 mmol/L (ref 98–111)
Creatinine, Ser: 0.55 mg/dL (ref 0.44–1.00)
GFR calc Af Amer: >60 mL/min (ref >60)
GFR calc non Af Amer: >60 mL/min (ref >60)
Glucose, Bld: 117 mg/dL — ABNORMAL HIGH (ref 70–99)
Potassium: 4.1 mmol/L (ref 3.5–5.1)
Sodium: 135 mmol/L (ref 135–145)

## 2019-09-28 LAB — CBC
HCT: 37.4 % (ref 36.0–46.0)
Hemoglobin: 12.3 g/dL (ref 12.0–15.0)
MCH: 32.2 pg (ref 26.0–34.0)
MCHC: 32.9 g/dL (ref 30.0–36.0)
MCV: 97.9 fL (ref 80.0–100.0)
Platelets: 178 10*3/uL (ref 150–400)
RBC: 3.82 MIL/uL — ABNORMAL LOW (ref 3.87–5.11)
RDW: 13.4 % (ref 11.5–15.5)
WBC: 8 10*3/uL (ref 4.0–10.5)
nRBC: 0 % (ref 0.0–0.2)

## 2019-09-28 NOTE — Care Management Important Message (Signed)
Important Message  Patient Details IM Letter given to Kathrin Greathouse SW Case Manager to present to the Patient Name: Heidi [name] MRN: [id number] Date of Birth: [birth year]   Medicare Important Message Given:  Yes     Kerin Salen 09/28/2019, 11:31 AM

## 2019-09-28 NOTE — Progress Notes (Signed)
Rehab Admissions Coordinator Note:  Per PT recommendation, this patient was screened by Raechel Ache for appropriateness for an Inpatient Acute Rehab Consult.  Unfortunately, while this patient has intensive functional therapy needs, she does not have medical necessity to warrant an IP Rehab stay as it appears her other medical conditions are stable. AC will not pursue CIR for this patient at this time.    Raechel Ache 09/28/2019, 12:06 PM  I can be reached at [phone].

## 2019-09-28 NOTE — Progress Notes (Signed)
    Subjective: Patient reports pain as mild.  Wrist nerve block starting to wear off.  Right leg without pain.  Tolerating diet.  Urinating.  Not yet mobilized with therapy.    Objective:   VITALS:   Vitals:   09/27/19 1812 09/27/19 2148 09/28/19 0153 09/28/19 0544  BP: 139/69 139/62 137/65 137/66  Pulse: 68 71 70 65  Resp: 13 16 15 15   Temp: 98.5 F (36.9 C) 98.3 F (36.8 C) 98.3 F (36.8 C) 98.4 F (36.9 C)  TempSrc: Oral     SpO2: 100% 96% 99% 97%  Weight:      Height:       CBC Latest Ref Rng & Units 09/28/2019 09/27/2019 09/26/2019  WBC 4.0 - 10.5 K/uL 8.0 8.1 8.8  Hemoglobin 12.0 - 15.0 g/dL 12.3 13.1 13.6  Hematocrit 36.0 - 46.0 % 37.4 39.2 41.1  Platelets 150 - 400 K/uL 178 194 206   BMP Latest Ref Rng & Units 09/28/2019 09/27/2019 09/26/2019  Glucose 70 - 99 mg/dL 117(H) 117(H) 137(H)  BUN 8 - 23 mg/dL 13 11 13   Creatinine 0.44 - 1.00 mg/dL 0.55 0.56 0.52  Sodium 135 - 145 mmol/L 135 133(L) 138  Potassium 3.5 - 5.1 mmol/L 4.1 3.9 3.7  Chloride 98 - 111 mmol/L 104 102 106  CO2 22 - 32 mmol/L 23 26 21(L)  Calcium 8.9 - 10.3 mg/dL 8.3(L) 8.5(L) 8.7(L)   Intake/Output      05/25 0701 - 05/26 0700 05/26 0701 - 05/27 0700   P.O. 120    I.V. (mL/kg) 1426.7 (23.7)    IV Piggyback 100    Total Intake(mL/kg) 1646.7 (27.3)    Urine (mL/kg/hr) 2025 (1.4)    Stool     Blood 25    Total Output 2050    Net -403.3         Urine Occurrence 1 x       Physical Exam: General: NAD.  Upright in bed.  Calm, conversant. MSK LUE: Hand warm.  Sensation intact.  Gross movement intact-nerve block has started to wear off.  RLE: Knee immobilizer in place. Feet warm. Sensation intact distally Dorsiflexion/Plantar flexion intact Incision: dressing C/D/I   Assessment / Plan: 1 Day Post-Op  S/P Procedure(s) (LRB): OPEN REDUCTION INTERNAL FIXATION (ORIF) WRIST FRACTURE (Left) OPEN REDUCTION INTERNAL (ORIF) FIXATION PATELLA (Right) by Dr. Ernesta Amble. Percell Miller on  09/27/2019  Principal Problem:   Multiple fractures Active Problems:   Essential hypertension   Ulcerative colitis ([place])   Biliary cirrhosis ([place])   Distal radius fracture, left   Patellar fracture   Left distal radius fracture Status post ORIF Needs removable wrist brace NWB left wrist.  Okay for WB/ROM to elbow  Right patella fracture Status post ORIF Weightbearing as tolerated-must keep knee fully extended in knee immobilizer at all times. Maintain dressings until follow-up.  DME platform walker and left wrist brace ordered Incentive Spirometry  Elevate Ice as needed Lovenox for DVT prophylaxis  Contact information:  Edmonia Lynch MD, Roxan Hockey PA-C  Dispo: TBD.  Therapy evaluations pending.  Discharge when ready medically and mobilized.  Patient lives with her daughter and has other family support during the day.   Follow-up with Dr. Percell Miller in the office in 10 to 14 days.   Charna Elizabeth Martensen III, PA-C 09/28/2019, 7:23 AM

## 2019-09-28 NOTE — NC FL2 (Signed)
[place] MEDICAID FL2 LEVEL OF CARE SCREENING TOOL     IDENTIFICATION  Patient Name: Heidi [name] Birthdate: Nov 04, 1934 Sex: female Admission Date (Current Location): 09/25/2019  [facility] and [place] Number:  Herbalist and Address:  [facility],  [place], [place]      Provider Number: [id number]  Attending Physician Name and Address:  Desiree Hane, MD  Relative Name and Phone Number:  Dolores Lory Daughter [phone]  [phone]    Current Level of Care: Hospital Recommended Level of Care: [place] Prior Approval Number:    Date Approved/Denied:   PASRR Number:    [id number]   Discharge Plan: SNF    Current Diagnoses: Patient Active Problem List   Diagnosis Date Noted  . Distal radius fracture, left 09/25/2019  . Patellar fracture 09/25/2019  . Multiple fractures 09/25/2019  . Back pain 11/16/2017  . Cecal cancer ([place]) 09/10/2012  . Colonic mass 09/01/2012  . Stricture and stenosis of esophagus 05/27/2011  . Hemorrhage of rectum and anus 05/27/2011  . Esophageal reflux 07/31/2010  . Biliary cirrhosis ([place]) 06/07/2008  . Essential hypertension 08/23/2007  . ARTHRITIS 08/23/2007  . CATARACT, LEFT EYE 11/27/2005  . Ulcerative colitis ([place]) 08/29/2005  . DIVERTICULOSIS, COLON 02/05/2001    Orientation RESPIRATION BLADDER Height & Weight     Self, Time, Situation, Place  Normal Incontinent, External catheter Weight: 133 lb (60.3 kg) Height:  5' 2"  (157.5 cm)  BEHAVIORAL SYMPTOMS/MOOD NEUROLOGICAL BOWEL NUTRITION STATUS      Continent Diet(Regular)  AMBULATORY STATUS COMMUNICATION OF NEEDS Skin   Extensive Assist Verbally Surgical wounds, Bruising(Laceration on upper lip, Right Knee/ Left Wrist Surgical incision)                       Personal Care Assistance Level of Assistance  Bathing, Feeding, Dressing Bathing Assistance: Maximum assistance Feeding assistance: Limited  assistance Dressing Assistance: Maximum assistance     Functional Limitations Info  Sight, Hearing, Speech Sight Info: Adequate Hearing Info: Impaired Speech Info: Adequate    SPECIAL CARE FACTORS FREQUENCY  PT (By licensed PT), OT (By licensed OT)     PT Frequency: 5x/week OT Frequency: 5x/week            Contractures Contractures Info: Not present    Additional Factors Info  Code Status, Allergies Code Status Info: Fullcode Allergies Info: Allergies: Codeine, Penicillins           Current Medications (09/28/2019):  This is the current hospital active medication list Current Facility-Administered Medications  Medication Dose Route Frequency Provider Last Rate Last Admin  . acetaminophen (TYLENOL) tablet 500 mg  500 mg Oral Q6H Martensen, Charna Elizabeth III, PA-C   500 mg at 09/28/19 1157  . acetaminophen (TYLENOL) tablet 650 mg  650 mg Oral Q4H PRN Prudencio Burly III, PA-C   650 mg at 09/27/19 1742  . docusate sodium (COLACE) capsule 100 mg  100 mg Oral BID Prudencio Burly III, PA-C   100 mg at 09/28/19 1026  . enoxaparin (LOVENOX) injection 40 mg  40 mg Subcutaneous Q24H Prudencio Burly III, PA-C   40 mg at 09/27/19 2116  . fluticasone (FLONASE) 50 MCG/ACT nasal spray 1 spray  1 spray Each Nare Daily Martensen, Charna Elizabeth III, PA-C      . irbesartan (AVAPRO) tablet 300 mg  300 mg Oral QPM Prudencio Burly III, PA-C   300 mg at 09/27/19 1742  .  lactated ringers infusion   Intravenous Continuous Prudencio Burly III, PA-C 75 mL/hr at 09/27/19 1500 Restarted at 09/27/19 1500  . mesalamine (LIALDA) EC tablet 2.4 g  2.4 g Oral Q breakfast Prudencio Burly III, PA-C   2.4 g at 09/28/19 7342  . methocarbamol (ROBAXIN) tablet 500 mg  500 mg Oral Q6H PRN Prudencio Burly III, PA-C   500 mg at 09/28/19 0732   Or  . methocarbamol (ROBAXIN) 500 mg in dextrose 5 % 50 mL IVPB  500 mg Intravenous Q6H PRN Prudencio Burly III,  PA-C      . morphine 2 MG/ML injection 2 mg  2 mg Intravenous Q4H PRN Prudencio Burly III, PA-C      . multivitamin (PROSIGHT) tablet 1 tablet  1 tablet Oral BID Prudencio Burly III, PA-C   1 tablet at 09/28/19 1026  . ondansetron (ZOFRAN) injection 4 mg  4 mg Intravenous Q6H PRN Prudencio Burly III, PA-C   4 mg at 09/25/19 2048  . ondansetron (ZOFRAN) tablet 4 mg  4 mg Oral Q6H PRN Prudencio Burly III, PA-C       Or  . ondansetron [facility]) injection 4 mg  4 mg Intravenous Q6H PRN Prudencio Burly III, PA-C      . oxyCODONE (Oxy IR/ROXICODONE) immediate release tablet 5 mg  5 mg Oral Q4H PRN Prudencio Burly III, PA-C   5 mg at 09/28/19 1157  . pantoprazole (PROTONIX) EC tablet 40 mg  40 mg Oral Daily Prudencio Burly III, PA-C   40 mg at 09/27/19 1743  . polyethylene glycol (MIRALAX / GLYCOLAX) packet 17 g  17 g Oral Daily PRN Prudencio Burly III, PA-C      . senna-docusate (Senokot-S) tablet 2 tablet  2 tablet Oral BID Prudencio Burly III, PA-C   2 tablet at 09/27/19 2116  . sodium chloride flush (NS) 0.9 % injection 3 mL  3 mL Intravenous Q12H Prudencio Burly III, PA-C   3 mL at 09/28/19 1028  . ursodiol (ACTIGALL) capsule 300 mg  300 mg Oral Q48H Martensen, Charna Elizabeth III, PA-C   300 mg at 09/28/19 1026  . ursodiol (ACTIGALL) capsule 300 mg  300 mg Oral QHS Prudencio Burly III, PA-C   300 mg at 09/27/19 2116  . ursodiol (ACTIGALL) capsule 600 mg  600 mg Oral Q48H Martensen, Charna Elizabeth III, PA-C         Discharge Medications: Please see discharge summary for a list of discharge medications.  Relevant Imaging Results:  Relevant Lab Results:   Additional Information AJG:[id number]  Lia Hopping, LCSW

## 2019-09-28 NOTE — Plan of Care (Signed)

## 2019-09-28 NOTE — Plan of Care (Signed)

## 2019-09-28 NOTE — TOC Initial Note (Signed)
Transition of Care [facility]) - Initial/Assessment Note    Patient Details  Name: Heidi [name] MRN: [id number] Date of Birth: May 21, 1934  [facility] CM/SW Contact:    Lia Hopping, [place] Phone Number: 09/28/2019, 1:41 PM  Clinical Narrative:  CIR screened patient unable to accept the patient at this time.                CSW met the patient and her daughter at bedside to discuss rehab at [facility]. Patient and daughter agreeable. Patient reports prior to her fall she was very independent with her ADL's and feels as though she will benefit from a short rehab stay. CSW explain SNF process and will follow up with SNF choices.Patient familiar with facilities in the area and provided a few that she may be interested in going to, to complete rehab.  CSW explain medicare insurance coverage/3 midnight stay inpatient qualification. Patient report understanding.  FL2 to be completed.    Expected Discharge Plan: [place] Barriers to Discharge: Continued Medical Work up, Other (comment)(Covid test)   Patient Goals and CMS Choice Patient states their goals for this hospitalization and ongoing recovery are:: " To walk without pain." CMS Medicare.gov Compare Post Acute Care list provided to:: Patient Choice offered to / list presented to : Patient, Adult Children  Expected Discharge Plan and Services Expected Discharge Plan: [place] In-house Referral: Clinical Social Work Discharge Planning Services: CM Consult Post Acute Care Choice: [place] arrangements for the past 2 months: Single Family Home                 DME Arranged: N/A DME Agency: NA       HH Arranged: NA [place] Agency: NA        Prior Living Arrangements/Services Living arrangements for the past 2 months: [place] Lives with:: Adult Children Patient language and need for interpreter reviewed:: Yes Do you feel safe going back to the place where you  live?: Yes        Care giver support system in place?: Yes (comment)   Criminal Activity/Legal Involvement Pertinent to Current Situation/Hospitalization: No - Comment as needed  Activities of Daily Living Home Assistive Devices/Equipment: Other (Comment) ADL Screening (condition at time of admission) Patient's cognitive ability adequate to safely complete daily activities?: Yes Is the patient deaf or have difficulty hearing?: Yes Does the patient have difficulty seeing, even when wearing glasses/contacts?: No Does the patient have difficulty concentrating, remembering, or making decisions?: No Patient able to express need for assistance with ADLs?: No Does the patient have difficulty dressing or bathing?: No Independently performs ADLs?: Yes (appropriate for developmental age) Does the patient have difficulty walking or climbing stairs?: No Weakness of Legs: None Weakness of Arms/Hands: None  Permission Sought/Granted Permission sought to share information with : Case Manager, Customer service manager, Family Supports Permission granted to share information with : Yes, Verbal Permission Granted  Share Information with NAME: Bawden,Robin  Permission granted to share info w AGENCY: SNF's in the area  Permission granted to share info w Relationship: Daughter  Permission granted to share info w Contact Information: [phone]  [phone]  Emotional Assessment Appearance:: Appears stated age Attitude/Demeanor/Rapport: Engaged Affect (typically observed): Accepting, Pleasant Orientation: : Oriented to Self, Oriented to Place, Oriented to  Time, Oriented to Situation Alcohol / Substance Use: Not Applicable Psych Involvement: No (comment)  Admission diagnosis:  Multiple fractures [T07.XXXA] Fall [W19.XXXA] Lip laceration, initial encounter [W73.710G] Closed  nondisplaced comminuted fracture of right patella, initial encounter [S82.044A] Other closed intra-articular fracture  of distal end of left radius, initial encounter [S52.572A] Patient Active Problem List   Diagnosis Date Noted  . Distal radius fracture, left 09/25/2019  . Patellar fracture 09/25/2019  . Multiple fractures 09/25/2019  . Back pain 11/16/2017  . Cecal cancer ([place]) 09/10/2012  . Colonic mass 09/01/2012  . Stricture and stenosis of esophagus 05/27/2011  . Hemorrhage of rectum and anus 05/27/2011  . Esophageal reflux 07/31/2010  . Biliary cirrhosis ([place]) 06/07/2008  . Essential hypertension 08/23/2007  . ARTHRITIS 08/23/2007  . CATARACT, LEFT EYE 11/27/2005  . Ulcerative colitis ([place]) 08/29/2005  . DIVERTICULOSIS, COLON 02/05/2001   PCP:  Crist Infante, MD Pharmacy:   [place], [place] - [address] [place] [address] Phone: [phone] Fax: [phone]  [facility] [place], [place] [address] Phone: [phone] Fax: [phone]     Social Determinants of Health (SDOH) Interventions    Readmission Risk Interventions No flowsheet data found.

## 2019-09-28 NOTE — Progress Notes (Signed)
Orthopedic Tech Progress Note Patient Details:  Heidi [name] [facility] 1934-12-08 [id number]  Ortho Devices Type of Ortho Device: Velcro wrist splint Ortho Device/Splint Location: left Ortho Device/Splint Interventions: Application   Post Interventions Patient Tolerated: Well Instructions Provided: Care of device   Heidi [name] 09/28/2019, 8:27 AM

## 2019-09-28 NOTE — Progress Notes (Signed)
[facility]  PROGRESS NOTE  Heidi [name] HVF:[id number] DOB: 10/13/1934 DOA: 09/25/2019 PCP: Crist Infante, MD Admit date - 09/25/2019   Admitting Physician Dwyane Dee, MD  Outpatient Primary MD for the patient is Crist Infante, MD  LOS - 3 Brief Narrative   Heidi [name] is a 85 y.o. year old female with medical history significant for arthritis, UC (left sided, dx 14 yrs ago, treated with mesalamine), osteoporosis, HLD, HTN, macular degeneration, GERD, diverticulosis, PBC (on ursodiol) who presented on 09/25/2019 decreased ability to bear weight on right lower extremity and left for swelling after ground-level mechanical fall.  Patient [place] the dog park and fell onto the concrete, patient did not have any head trauma or LOC, was able to brace her fall but hit both knees on the ground.  Patient was found to have right patellar fracture and left distal radius fracture with laceration of lip on imaging in the ED.  She underwent open reduction internal fixation of both left wrist and right patella by Dr. Percell Miller on 09/27/2019.    Subjective  Today did require some IV morphine for persistent pain.  Having BMs.  Noted some spotting on padding.  A & P   Right patellar fracture, status post ORIF, 09/27/2019 -Ortho recommends weightbearing as tolerated with knee fully extended and knee immobilizer at all times -Maintain dressings until Ortho follow-up with Dr. Percell Miller in 10 to 14 days -PT/OT recommend CIR -TOC assisting with arrangement of SNF continue incentive spirometer  Left distal radius fracture status post ORIF, 09/27/2019 -Ortho recommends nonweightbearing to left wrist, removable wrist brace and platform walker for DME -Follow with Dr. Percell Miller in 10 to 14 days after discharge   Colitis/primary biliary cirrhosis, stable. -Continue mesalamine, ursodiol  GERD, stable -Continue PPI  HTN, stable -Continue ARB     Family Communication  : None at bedside Code  Status : Full  Disposition Plan  :  Patient is from home. Anticipated d/c date:  24 hours barriers to d/c or necessity for inpatient status:  Medically stable for discharge, currently arranging SNF  with TOC assistance Consults  : Orthopedics  Procedures  :  open reduction internal fixation of both left wrist and right patella by Dr. Percell Miller on 09/27/2019  DVT Prophylaxis  :  Lovenox -  Lab Results  Component Value Date   PLT 178 09/28/2019    Diet :  Diet Order            Diet regular Room service appropriate? Yes; Fluid consistency: Thin  Diet effective now               Inpatient Medications Scheduled Meds: . acetaminophen  500 mg Oral Q6H  . docusate sodium  100 mg Oral BID  . enoxaparin (LOVENOX) injection  40 mg Subcutaneous Q24H  . fluticasone  1 spray Each Nare Daily  . irbesartan  300 mg Oral QPM  . mesalamine  2.4 g Oral Q breakfast  . multivitamin  1 tablet Oral BID  . pantoprazole  40 mg Oral Daily  . senna-docusate  2 tablet Oral BID  . sodium chloride flush  3 mL Intravenous Q12H  . ursodiol  300 mg Oral Q48H  . ursodiol  300 mg Oral QHS  . ursodiol  600 mg Oral Q48H   Continuous Infusions: . methocarbamol (ROBAXIN) IV     PRN Meds:.acetaminophen, methocarbamol **OR** methocarbamol (ROBAXIN) IV, morphine injection, ondansetron (ZOFRAN) IV, ondansetron **OR** ondansetron (ZOFRAN) IV, oxyCODONE, polyethylene glycol  Antibiotics  :   Anti-infectives (From admission, onward)   Start     Dose/Rate Route Frequency Ordered Stop   09/27/19 1800  ceFAZolin (ANCEF) IVPB 1 g/50 mL premix     1 g 100 mL/hr over 30 Minutes Intravenous Every 6 hours 09/27/19 1443 09/28/19 0555   09/27/19 0600  ceFAZolin (ANCEF) IVPB 2g/100 mL premix     2 g 200 mL/hr over 30 Minutes Intravenous On call to O.R. 09/26/19 1634 09/27/19 1205       Objective   Vitals:   09/28/19 0153 09/28/19 0544 09/28/19 1010 09/28/19 1312  BP: 137/65 137/66 114/60 116/60  Pulse: 70 65 79 74    Resp: 15 15 16 16   Temp: 98.3 F (36.8 C) 98.4 F (36.9 C) 98 F (36.7 C) 98.3 F (36.8 C)  TempSrc:   Oral Oral  SpO2: 99% 97% 95% 97%  Weight:      Height:        SpO2: 97 % O2 Flow Rate (L/min): 2 L/min  Wt Readings from Last 3 Encounters:  09/27/19 60.3 kg  01/19/19 63 kg  01/12/19 63.1 kg     Intake/Output Summary (Last 24 hours) at 09/28/2019 1601 Last data filed at 09/28/2019 0544 Gross per 24 hour  Intake 1099.45 ml  Output 1675 ml  Net -575.55 ml    Physical Exam:     Awake Alert, Oriented X 3, Normal affect No new F.N deficits,  .AT, Normal respiratory effort on room air CTAB RRR,No Gallops,Rubs or new Murmurs,  +ve B.Sounds, Abd Soft, No tenderness, No rebound, guarding or rigidity. Left wrist brace in place, immobilizer on right knee in place   I have personally reviewed the following:   Data Reviewed:  CBC Recent Labs  Lab 09/25/19 1347 09/26/19 0330 09/27/19 0314 09/28/19 0300  WBC 8.4 8.8 8.1 8.0  HGB 14.1 13.6 13.1 12.3  HCT 43.0 41.1 39.2 37.4  PLT 223 206 194 178  MCV 98.2 96.3 96.3 97.9  MCH 32.2 31.9 32.2 32.2  MCHC 32.8 33.1 33.4 32.9  RDW 13.5 13.5 13.4 13.4  LYMPHSABS 1.0  --   --   --   MONOABS 0.5  --   --   --   EOSABS 0.1  --   --   --   BASOSABS 0.0  --   --   --     Chemistries  Recent Labs  Lab 09/25/19 1347 09/26/19 0330 09/27/19 0314 09/28/19 0300  NA 139 138 133* 135  K 4.2 3.7 3.9 4.1  CL 108 106 102 104  CO2 24 21* 26 23  GLUCOSE 100* 137* 117* 117*  BUN 17 13 11 13   CREATININE 0.71 0.52 0.56 0.55  CALCIUM 8.7* 8.7* 8.5* 8.3*  MG  --  2.1  --   --    ------------------------------------------------------------------------------------------------------------------ No results for input(s): CHOL, HDL, LDLCALC, TRIG, CHOLHDL, LDLDIRECT in the last 72 hours.  No results found for:  HGBA1C ------------------------------------------------------------------------------------------------------------------ No results for input(s): TSH, T4TOTAL, T3FREE, THYROIDAB in the last 72 hours.  Invalid input(s): FREET3 ------------------------------------------------------------------------------------------------------------------ No results for input(s): VITAMINB12, FOLATE, FERRITIN, TIBC, IRON, RETICCTPCT in the last 72 hours.  Coagulation profile Recent Labs  Lab 09/27/19 0314  INR 1.0    No results for input(s): DDIMER in the last 72 hours.  Cardiac Enzymes No results for input(s): CKMB, TROPONINI, MYOGLOBIN in the last 168 hours.  Invalid input(s): CK ------------------------------------------------------------------------------------------------------------------ No results found for: BNP  Micro Results Recent Results (  from the past 240 hour(s))  SARS Coronavirus 2 by RT PCR (hospital order, performed in [facility] hospital lab) Nasopharyngeal Nasopharyngeal Swab     Status: None   Collection Time: 09/25/19  1:47 PM   Specimen: Nasopharyngeal Swab  Result Value Ref Range Status   SARS Coronavirus 2 NEGATIVE NEGATIVE Final    Comment: (NOTE) SARS-CoV-2 target nucleic acids are NOT DETECTED. The SARS-CoV-2 RNA is generally detectable in upper and lower respiratory specimens during the acute phase of infection. The lowest concentration of SARS-CoV-2 viral copies this assay can detect is 250 copies / mL. A negative result does not preclude SARS-CoV-2 infection and should not be used as the sole basis for treatment or other patient management decisions.  A negative result may occur with improper specimen collection / handling, submission of specimen other than nasopharyngeal swab, presence of viral mutation(s) within the areas targeted by this assay, and inadequate number of viral copies (<250 copies / mL). A negative result must be combined with  clinical observations, patient history, and epidemiological information. Fact Sheet for Patients:   StrictlyIdeas.no Fact Sheet for Healthcare Providers: BankingDealers.co.za This test is not yet approved or cleared  by the [place] FDA and has been authorized for detection and/or diagnosis of SARS-CoV-2 by FDA under an Emergency Use Authorization (EUA).  This EUA will remain in effect (meaning this test can be used) for the duration of the COVID-19 declaration under Section 564(b)(1) of the Act, 21 U.S.C. section 360bbb-3(b)(1), unless the authorization is terminated or revoked sooner. Performed at [facility], [place] [address] [place], [place] [address]   MRSA PCR Screening     Status: None   Collection Time: 09/26/19  6:16 PM   Specimen: Nasal Mucosa; Nasopharyngeal  Result Value Ref Range Status   MRSA by PCR NEGATIVE NEGATIVE Final    Comment:        The GeneXpert MRSA Assay (FDA approved for NASAL specimens only), is one component of a comprehensive MRSA colonization surveillance program. It is not intended to diagnose MRSA infection nor to guide or monitor treatment for MRSA infections. Performed at [facility], [place] [address] [place], [place] [address]     Radiology Reports DG Forearm Left  Result Date: 09/25/2019 CLINICAL DATA:  Fall, pain EXAM: LEFT FOREARM - 2 VIEW; LEFT HAND - COMPLETE 3+ VIEW; LEFT WRIST - COMPLETE 3+ VIEW COMPARISON:  None. FINDINGS: There are angulated intra-articular fractures of the distal left radius. The proximal radius and ulna are intact. The carpus proper is normally aligned. No fracture or dislocation of the left hand. There is severe arthrosis of the left thumb basal joint with otherwise mild osteoarthritic change. Soft tissues are unremarkable. IMPRESSION: 1. Angulated intra-articular fractures of the distal left radius. The proximal radius and  ulna are intact. 2. The carpus proper is normally aligned. 3. No fracture or dislocation of the left hand. Severe arthrosis of the left thumb basal joint. Electronically Signed   By: Eddie Candle M.D.   On: 09/25/2019 12:27   DG Wrist Complete Left  Result Date: 09/25/2019 CLINICAL DATA:  Fall, pain EXAM: LEFT FOREARM - 2 VIEW; LEFT HAND - COMPLETE 3+ VIEW; LEFT WRIST - COMPLETE 3+ VIEW COMPARISON:  None. FINDINGS: There are angulated intra-articular fractures of the distal left radius. The proximal radius and ulna are intact. The carpus proper is normally aligned. No fracture or dislocation of the left hand. There is severe arthrosis of the left thumb basal  joint with otherwise mild osteoarthritic change. Soft tissues are unremarkable. IMPRESSION: 1. Angulated intra-articular fractures of the distal left radius. The proximal radius and ulna are intact. 2. The carpus proper is normally aligned. 3. No fracture or dislocation of the left hand. Severe arthrosis of the left thumb basal joint. Electronically Signed   By: Eddie Candle M.D.   On: 09/25/2019 12:27   CT Head Wo Contrast  Result Date: 09/25/2019 CLINICAL DATA:  Fall, facial trauma, laceration to upper lip EXAM: CT HEAD WITHOUT CONTRAST CT MAXILLOFACIAL WITHOUT CONTRAST CT CERVICAL SPINE WITHOUT CONTRAST TECHNIQUE: Multidetector CT imaging of the head, cervical spine, and maxillofacial structures were performed using the standard protocol without intravenous contrast. Multiplanar CT image reconstructions of the cervical spine and maxillofacial structures were also generated. COMPARISON:  None. FINDINGS: CT HEAD FINDINGS Brain: No evidence of acute infarction, hemorrhage, hydrocephalus, extra-axial collection or mass lesion/mass effect. Periventricular white matter hypodensity. Vascular: No hyperdense vessel or unexpected calcification. CT FACIAL BONES FINDINGS Skull: Normal. Negative for fracture or focal lesion. Facial bones: No displaced fractures or  dislocations. Sinuses/Orbits: No acute finding. Other: None. CT CERVICAL SPINE FINDINGS Alignment: Normal. Skull base and vertebrae: No acute fracture. No primary bone lesion or focal pathologic process. Soft tissues and spinal canal: No prevertebral fluid or swelling. No visible canal hematoma. Disc levels: Mild multilevel disc space height loss and osteophytosis. Upper chest: Negative. Other: None. IMPRESSION: 1. No acute intracranial pathology. Small-vessel white matter disease. 2. No displaced fractures or dislocations of the facial bones. 3. No fracture or static subluxation of the cervical spine. 4. Mild multilevel degenerative disc disease of the cervical spine. Electronically Signed   By: Eddie Candle M.D.   On: 09/25/2019 12:33   CT Cervical Spine Wo Contrast  Result Date: 09/25/2019 CLINICAL DATA:  Fall, facial trauma, laceration to upper lip EXAM: CT HEAD WITHOUT CONTRAST CT MAXILLOFACIAL WITHOUT CONTRAST CT CERVICAL SPINE WITHOUT CONTRAST TECHNIQUE: Multidetector CT imaging of the head, cervical spine, and maxillofacial structures were performed using the standard protocol without intravenous contrast. Multiplanar CT image reconstructions of the cervical spine and maxillofacial structures were also generated. COMPARISON:  None. FINDINGS: CT HEAD FINDINGS Brain: No evidence of acute infarction, hemorrhage, hydrocephalus, extra-axial collection or mass lesion/mass effect. Periventricular white matter hypodensity. Vascular: No hyperdense vessel or unexpected calcification. CT FACIAL BONES FINDINGS Skull: Normal. Negative for fracture or focal lesion. Facial bones: No displaced fractures or dislocations. Sinuses/Orbits: No acute finding. Other: None. CT CERVICAL SPINE FINDINGS Alignment: Normal. Skull base and vertebrae: No acute fracture. No primary bone lesion or focal pathologic process. Soft tissues and spinal canal: No prevertebral fluid or swelling. No visible canal hematoma. Disc levels: Mild  multilevel disc space height loss and osteophytosis. Upper chest: Negative. Other: None. IMPRESSION: 1. No acute intracranial pathology. Small-vessel white matter disease. 2. No displaced fractures or dislocations of the facial bones. 3. No fracture or static subluxation of the cervical spine. 4. Mild multilevel degenerative disc disease of the cervical spine. Electronically Signed   By: Eddie Candle M.D.   On: 09/25/2019 12:33   DG Knee Right Port  Result Date: 09/25/2019 CLINICAL DATA:  Right knee pain post fall. EXAM: PORTABLE RIGHT KNEE - 1-2 VIEW COMPARISON:  None. FINDINGS: There is a mildly comminuted transverse patellar fracture. There is an associated hemorrhagic suprapatellar joint effusion. No other fractures are identified. Mild osteoarthritic changes of the medial and lateral compartments of the right knee. Four line prepatellar soft tissue swelling. IMPRESSION: Mildly comminuted  transverse right patellar fracture with associated hemorrhagic suprapatellar joint effusion. Electronically Signed   By: Fidela Salisbury M.D.   On: 09/25/2019 12:25   DG Hand Complete Left  Result Date: 09/25/2019 CLINICAL DATA:  Fall, pain EXAM: LEFT FOREARM - 2 VIEW; LEFT HAND - COMPLETE 3+ VIEW; LEFT WRIST - COMPLETE 3+ VIEW COMPARISON:  None. FINDINGS: There are angulated intra-articular fractures of the distal left radius. The proximal radius and ulna are intact. The carpus proper is normally aligned. No fracture or dislocation of the left hand. There is severe arthrosis of the left thumb basal joint with otherwise mild osteoarthritic change. Soft tissues are unremarkable. IMPRESSION: 1. Angulated intra-articular fractures of the distal left radius. The proximal radius and ulna are intact. 2. The carpus proper is normally aligned. 3. No fracture or dislocation of the left hand. Severe arthrosis of the left thumb basal joint. Electronically Signed   By: Eddie Candle M.D.   On: 09/25/2019 12:27   CT  Maxillofacial WO CM  Result Date: 09/25/2019 CLINICAL DATA:  Fall, facial trauma, laceration to upper lip EXAM: CT HEAD WITHOUT CONTRAST CT MAXILLOFACIAL WITHOUT CONTRAST CT CERVICAL SPINE WITHOUT CONTRAST TECHNIQUE: Multidetector CT imaging of the head, cervical spine, and maxillofacial structures were performed using the standard protocol without intravenous contrast. Multiplanar CT image reconstructions of the cervical spine and maxillofacial structures were also generated. COMPARISON:  None. FINDINGS: CT HEAD FINDINGS Brain: No evidence of acute infarction, hemorrhage, hydrocephalus, extra-axial collection or mass lesion/mass effect. Periventricular white matter hypodensity. Vascular: No hyperdense vessel or unexpected calcification. CT FACIAL BONES FINDINGS Skull: Normal. Negative for fracture or focal lesion. Facial bones: No displaced fractures or dislocations. Sinuses/Orbits: No acute finding. Other: None. CT CERVICAL SPINE FINDINGS Alignment: Normal. Skull base and vertebrae: No acute fracture. No primary bone lesion or focal pathologic process. Soft tissues and spinal canal: No prevertebral fluid or swelling. No visible canal hematoma. Disc levels: Mild multilevel disc space height loss and osteophytosis. Upper chest: Negative. Other: None. IMPRESSION: 1. No acute intracranial pathology. Small-vessel white matter disease. 2. No displaced fractures or dislocations of the facial bones. 3. No fracture or static subluxation of the cervical spine. 4. Mild multilevel degenerative disc disease of the cervical spine. Electronically Signed   By: Eddie Candle M.D.   On: 09/25/2019 12:33     Time Spent in minutes  30     Desiree Hane M.D on 09/28/2019 at 4:01 PM  To page go to [email or web address] - password [facility]

## 2019-09-28 NOTE — Evaluation (Signed)
Occupational Therapy Evaluation Patient Details Name: Heidi [name] MRN: [id number] DOB: 18-Dec-1934 Today's Date: 09/28/2019    History of Present Illness Pt is an 85 year old female admitted after fall and resulting left distal radial fracture s/p ORIF and right patella fracture s/p ORIF.   Clinical Impression   Patient with functional deficits listed below impacting safety and independence with self care. At baseline patient walked 2-3 miles a day and was fully independent, lives with DTR that works during the day. Patient has 2 other daughters that will set up 24/7 supervision for patient while DTR she lives with is working. Patient is highly motivated and excellent candidate for inpatient rehab. Pt initially mod A to stand from EOB with 1 step cues for sequencing body mechanics. Pt min A to stand from bedside commode and improved utilization of L platform walker with transfer from commode to chair. Will continue to follow.    Follow Up Recommendations  CIR    Equipment Recommendations  3 in 1 bedside commode;Tub/shower seat;Wheelchair (measurements OT);Other (comment)(whee L platform walker)    Recommendations for Other Services Rehab consult     Precautions / Restrictions Precautions Precautions: Knee;Fall Required Braces or Orthoses: Knee Immobilizer - Right;Splint/Cast Knee Immobilizer - Right: On at all times Splint/Cast: L wrist splint/brace in place Restrictions Weight Bearing Restrictions: Yes LUE Weight Bearing: Weight bear through elbow only RLE Weight Bearing: Weight bearing as tolerated      Mobility Bed Mobility Overal bed mobility: Needs Assistance Bed Mobility: Supine to Sit     Supine to sit: Min assist;HOB elevated     General bed mobility comments: cues not to push through L hand, cues for sequencing  Transfers Overall transfer level: Needs assistance Equipment used: Left platform walker Transfers: Sit to/from Stand Sit to Stand: Min assist;Mod  assist         General transfer comment: patient initially mod A for sit to stand from edge of bed with cues for sequencing body mechanics, min A to stand from bedside commode with continued cues for sequencing.    Balance Overall balance assessment: History of Falls;Needs assistance Sitting-balance support: No upper extremity supported;Feet supported Sitting balance-Leahy Scale: Good     Standing balance support: Bilateral upper extremity supported;During functional activity Standing balance-Leahy Scale: Poor Standing balance comment: reliant on B UE support                           ADL either performed or assessed with clinical judgement   ADL Overall ADL's : Needs assistance/impaired Eating/Feeding: Set up;Sitting   Grooming: Set up;Sitting   Upper Body Bathing: Minimal assistance;Sitting   Lower Body Bathing: Moderate assistance;Sit to/from stand   Upper Body Dressing : Minimal assistance;Sitting   Lower Body Dressing: Moderate assistance;Sit to/from stand   Toilet Transfer: Minimal assistance;Ambulation;Cueing for safety;Cueing for sequencing;BSC Toilet Transfer Details (indicate cue type and reason): platform walker, cues for sequencing hand placement and for body mechanics Toileting- Clothing Manipulation and Hygiene: Total assistance;Sit to/from stand       Functional mobility during ADLs: Minimal assistance;Cueing for safety;Cueing for sequencing(platform walker)                    Pertinent Vitals/Pain Pain Assessment: Faces Pain Score: 4  Faces Pain Scale: Hurts a little bit Pain Location: R knee, L wrist Pain Descriptors / Indicators: Aching;Sore Pain Intervention(s): Premedicated before session;Monitored during session     Hand Dominance Right  Extremity/Trunk Assessment Upper Extremity Assessment Upper Extremity Assessment: LUE deficits/detail LUE: Unable to fully assess due to immobilization   Lower Extremity  Assessment Lower Extremity Assessment: Defer to PT evaluation RLE Deficits / Details: maintained KI, pt able to perform ankle pumps; no knee flexion precaution at this time       Communication Communication Communication: No difficulties   Cognition Arousal/Alertness: Awake/alert Behavior During Therapy: WFL for tasks assessed/performed Overall Cognitive Status: Within Functional Limits for tasks assessed                                                Home Living Family/patient expects to be discharged to:: Private residence Living Arrangements: Alone Available Help at Discharge: Family;Available PRN/intermittently Type of Home: House Home Access: Stairs to enter [employer] of Steps: 1 to porch then threshold to enter Entrance Stairs-Rails: None Home Layout: One level     Bathroom Shower/Tub: Occupational psychologist: Standard     Home Equipment: Grab bars - tub/shower;Hand held shower head          Prior Functioning/Environment Level of Independence: Independent        Comments: daughters plan to provide 24/7 supervision at discharge between the 3 of them        OT Problem List: Decreased strength;Decreased activity tolerance;Impaired balance (sitting and/or standing);Decreased safety awareness;Decreased knowledge of use of DME or AE;Decreased knowledge of precautions;Pain      OT Treatment/Interventions: Self-care/ADL training;Therapeutic exercise;Energy conservation;DME and/or AE instruction;Therapeutic activities;Patient/family education;Balance training    OT Goals(Current goals can be found in the care plan section) Acute Rehab OT Goals Patient Stated Goal: regain independence OT Goal Formulation: With patient Time For Goal Achievement: 10/12/19  OT Frequency: Min 2X/week    AM-PAC OT "6 Clicks" Daily Activity     Outcome Measure Help from another person eating meals?: A Little Help from another person taking  care of personal grooming?: A Little Help from another person toileting, which includes using toliet, bedpan, or urinal?: A Lot Help from another person bathing (including washing, rinsing, drying)?: A Lot Help from another person to put on and taking off regular upper body clothing?: A Little Help from another person to put on and taking off regular lower body clothing?: A Lot 6 Click Score: 15   End of Session Equipment Utilized During Treatment: Right knee immobilizer(L platform walker)  Activity Tolerance: Patient tolerated treatment well Patient left: in chair;with call bell/phone within reach;with chair alarm set;with family/visitor present  OT Visit Diagnosis: Other abnormalities of gait and mobility (R26.89);History of falling (Z91.81);Pain Pain - part of body: Knee;Arm                Time: 2297-9892 OT Time Calculation (min): 53 min Charges:  OT General Charges $OT Visit: 1 Visit OT Evaluation $OT Eval Moderate Complexity: 1 Mod OT Treatments $Self Care/Home Management : 38-52 mins  Delbert Phenix OT Pager: [place] 09/28/2019, 12:07 PM

## 2019-09-28 NOTE — Evaluation (Signed)
Physical Therapy Evaluation Patient Details Name: Heidi [name] MRN: [id number] DOB: 1934/10/02 Today's Date: 09/28/2019   History of Present Illness  Pt is an 85 year old female admitted after fall and resulting left distal radial fracture s/p ORIF and right patella fracture s/p ORIF.  Clinical Impression  Patient is s/p above surgeries resulting in functional limitations due to the deficits listed below (see PT Problem List).  Patient will benefit from skilled PT to increase their independence and safety with mobility to allow discharge to the venue listed below.  Pt now with new precautions and requiring assist for mobilizing.  Pt was walking on trails prior to fall and very independent.  Family can assist at home (supervision, IADLs) however would like pt to improve with precautions and mobility in rehab setting prior to d/c home.  Recommend CIR upon d/c.     Follow Up Recommendations CIR    Equipment Recommendations  3in1 (PT);Other (comment)(left PFRW if home)    Recommendations for Other Services       Precautions / Restrictions Precautions Precautions: Knee;Fall Required Braces or Orthoses: Knee Immobilizer - Right;Splint/Cast Knee Immobilizer - Right: On at all times Splint/Cast: L wrist splint/brace in place Restrictions Weight Bearing Restrictions: Yes LUE Weight Bearing: Weight bear through elbow only RLE Weight Bearing: Weight bearing as tolerated(with KI)      Mobility  Bed Mobility               General bed mobility comments: pt in recliner on arrival  Transfers Overall transfer level: Needs assistance Equipment used: Left platform walker Transfers: Sit to/from Stand Sit to Stand: Mod assist;Min assist         General transfer comment: initially mod assist however improved to min assist; cues for UE and LE positioning and weight shifting  Ambulation/Gait Ambulation/Gait assistance: Min assist Gait Distance (Feet): 30 Feet Assistive device:  Left platform walker Gait Pattern/deviations: Step-to pattern;Decreased stance time - right;Antalgic     General Gait Details: verbal cues for sequence and walker positioning; distance to tolerance  Stairs            Wheelchair Mobility    Modified Rankin (Stroke Patients Only)       Balance Overall balance assessment: History of Falls                                           Pertinent Vitals/Pain Pain Assessment: 0-10 Pain Score: 4  Pain Location: R knee, L wrist Pain Descriptors / Indicators: Aching;Sore Pain Intervention(s): Repositioned;Monitored during session    Home Living Family/patient expects to be discharged to:: Private residence Living Arrangements: Alone Available Help at Discharge: Family;Available PRN/intermittently Type of Home: House Home Access: Stairs to enter Entrance Stairs-Rails: None Entrance Stairs-Number of Steps: 1 Home Layout: One level Home Equipment: None      Prior Function Level of Independence: Independent               Hand Dominance        Extremity/Trunk Assessment        Lower Extremity Assessment Lower Extremity Assessment: RLE deficits/detail RLE Deficits / Details: maintained KI, pt able to perform ankle pumps; no knee flexion precaution at this time       Communication   Communication: No difficulties  Cognition Arousal/Alertness: Awake/alert Behavior During Therapy: WFL for tasks assessed/performed Overall Cognitive Status: Within Functional Limits  for tasks assessed                                        General Comments      Exercises     Assessment/Plan    PT Assessment Patient needs continued PT services  PT Problem List Decreased strength;Decreased mobility;Decreased knowledge of precautions;Decreased balance;Decreased knowledge of use of DME;Decreased activity tolerance;Pain       PT Treatment Interventions DME instruction;Therapeutic  activities;Gait training;Therapeutic exercise;Patient/family education;Functional mobility training;Balance training    PT Goals (Current goals can be found in the Care Plan section)  Acute Rehab PT Goals PT Goal Formulation: With patient Time For Goal Achievement: 10/05/19 Potential to Achieve Goals: Good    Frequency Min 3X/week   Barriers to discharge        Co-evaluation               AM-PAC PT "6 Clicks" Mobility  Outcome Measure Help needed turning from your back to your side while in a flat bed without using bedrails?: A Little Help needed moving from lying on your back to sitting on the side of a flat bed without using bedrails?: A Little Help needed moving to and from a bed to a chair (including a wheelchair)?: A Lot Help needed standing up from a chair using your arms (e.g., wheelchair or bedside chair)?: A Lot Help needed to walk in hospital room?: A Little Help needed climbing 3-5 steps with a railing? : A Lot 6 Click Score: 15    End of Session Equipment Utilized During Treatment: Gait belt Activity Tolerance: Patient tolerated treatment well Patient left: in chair;with call bell/phone within reach;with chair alarm set;with family/visitor present Nurse Communication: Mobility status PT Visit Diagnosis: Other abnormalities of gait and mobility (R26.89)    Time: 0160-1093 PT Time Calculation (min) (ACUTE ONLY): 17 min   Charges:   PT Evaluation $PT Eval Low Complexity: 1 Low        Kati PT, DPT Acute Rehabilitation Services Office: [phone]  Trena Platt 09/28/2019, 11:32 AM

## 2019-09-29 ENCOUNTER — Encounter: Payer: Self-pay | Admitting: *Deleted

## 2019-09-29 DIAGNOSIS — K745 Biliary cirrhosis, unspecified: Secondary | ICD-10-CM | POA: Diagnosis not present

## 2019-09-29 DIAGNOSIS — S82001D Unspecified fracture of right patella, subsequent encounter for closed fracture with routine healing: Secondary | ICD-10-CM | POA: Diagnosis not present

## 2019-09-29 DIAGNOSIS — Z20822 Contact with and (suspected) exposure to covid-19: Secondary | ICD-10-CM | POA: Diagnosis not present

## 2019-09-29 DIAGNOSIS — M810 Age-related osteoporosis without current pathological fracture: Secondary | ICD-10-CM | POA: Diagnosis not present

## 2019-09-29 DIAGNOSIS — R52 Pain, unspecified: Secondary | ICD-10-CM | POA: Diagnosis not present

## 2019-09-29 DIAGNOSIS — Z7401 Bed confinement status: Secondary | ICD-10-CM | POA: Diagnosis not present

## 2019-09-29 DIAGNOSIS — Z4789 Encounter for other orthopedic aftercare: Secondary | ICD-10-CM | POA: Diagnosis not present

## 2019-09-29 DIAGNOSIS — Z85038 Personal history of other malignant neoplasm of large intestine: Secondary | ICD-10-CM | POA: Diagnosis not present

## 2019-09-29 DIAGNOSIS — K5730 Diverticulosis of large intestine without perforation or abscess without bleeding: Secondary | ICD-10-CM | POA: Diagnosis not present

## 2019-09-29 DIAGNOSIS — S52502D Unspecified fracture of the lower end of left radius, subsequent encounter for closed fracture with routine healing: Secondary | ICD-10-CM | POA: Diagnosis not present

## 2019-09-29 DIAGNOSIS — W19XXXD Unspecified fall, subsequent encounter: Secondary | ICD-10-CM | POA: Diagnosis not present

## 2019-09-29 DIAGNOSIS — W010XXA Fall on same level from slipping, tripping and stumbling without subsequent striking against object, initial encounter: Secondary | ICD-10-CM | POA: Diagnosis not present

## 2019-09-29 DIAGNOSIS — S52572A Other intraarticular fracture of lower end of left radius, initial encounter for closed fracture: Secondary | ICD-10-CM | POA: Diagnosis not present

## 2019-09-29 DIAGNOSIS — E785 Hyperlipidemia, unspecified: Secondary | ICD-10-CM | POA: Diagnosis not present

## 2019-09-29 DIAGNOSIS — M2550 Pain in unspecified joint: Secondary | ICD-10-CM | POA: Diagnosis not present

## 2019-09-29 DIAGNOSIS — Y93K1 Activity, walking an animal: Secondary | ICD-10-CM | POA: Diagnosis not present

## 2019-09-29 DIAGNOSIS — W19XXXA Unspecified fall, initial encounter: Secondary | ICD-10-CM

## 2019-09-29 DIAGNOSIS — J309 Allergic rhinitis, unspecified: Secondary | ICD-10-CM | POA: Diagnosis not present

## 2019-09-29 DIAGNOSIS — I10 Essential (primary) hypertension: Secondary | ICD-10-CM | POA: Diagnosis not present

## 2019-09-29 DIAGNOSIS — S01511D Laceration without foreign body of lip, subsequent encounter: Secondary | ICD-10-CM | POA: Diagnosis not present

## 2019-09-29 DIAGNOSIS — M79604 Pain in right leg: Secondary | ICD-10-CM | POA: Diagnosis not present

## 2019-09-29 DIAGNOSIS — Z23 Encounter for immunization: Secondary | ICD-10-CM | POA: Diagnosis not present

## 2019-09-29 DIAGNOSIS — Y92830 Public park as the place of occurrence of the external cause: Secondary | ICD-10-CM | POA: Diagnosis not present

## 2019-09-29 DIAGNOSIS — S82001A Unspecified fracture of right patella, initial encounter for closed fracture: Secondary | ICD-10-CM | POA: Diagnosis not present

## 2019-09-29 DIAGNOSIS — M545 Low back pain: Secondary | ICD-10-CM | POA: Diagnosis not present

## 2019-09-29 DIAGNOSIS — H3530 Unspecified macular degeneration: Secondary | ICD-10-CM | POA: Diagnosis not present

## 2019-09-29 DIAGNOSIS — K219 Gastro-esophageal reflux disease without esophagitis: Secondary | ICD-10-CM | POA: Diagnosis not present

## 2019-09-29 DIAGNOSIS — M79632 Pain in left forearm: Secondary | ICD-10-CM | POA: Diagnosis not present

## 2019-09-29 DIAGNOSIS — S82044A Nondisplaced comminuted fracture of right patella, initial encounter for closed fracture: Secondary | ICD-10-CM

## 2019-09-29 DIAGNOSIS — R269 Unspecified abnormalities of gait and mobility: Secondary | ICD-10-CM | POA: Diagnosis not present

## 2019-09-29 DIAGNOSIS — H9193 Unspecified hearing loss, bilateral: Secondary | ICD-10-CM | POA: Diagnosis not present

## 2019-09-29 DIAGNOSIS — S52502A Unspecified fracture of the lower end of left radius, initial encounter for closed fracture: Secondary | ICD-10-CM | POA: Diagnosis not present

## 2019-09-29 DIAGNOSIS — S82031A Displaced transverse fracture of right patella, initial encounter for closed fracture: Secondary | ICD-10-CM | POA: Diagnosis not present

## 2019-09-29 DIAGNOSIS — K5190 Ulcerative colitis, unspecified, without complications: Secondary | ICD-10-CM | POA: Diagnosis not present

## 2019-09-29 DIAGNOSIS — M419 Scoliosis, unspecified: Secondary | ICD-10-CM | POA: Diagnosis not present

## 2019-09-29 DIAGNOSIS — Z7901 Long term (current) use of anticoagulants: Secondary | ICD-10-CM | POA: Diagnosis not present

## 2019-09-29 DIAGNOSIS — S01511A Laceration without foreign body of lip, initial encounter: Secondary | ICD-10-CM | POA: Diagnosis not present

## 2019-09-29 LAB — URINALYSIS, ROUTINE W REFLEX MICROSCOPIC
Bilirubin Urine: NEGATIVE
Glucose, UA: NEGATIVE mg/dL
Hgb urine dipstick: NEGATIVE
Ketones, ur: 5 mg/dL — AB
Nitrite: NEGATIVE
Protein, ur: 30 mg/dL — AB
Specific Gravity, Urine: 1.024 (ref 1.005–1.030)
pH: 5 (ref 5.0–8.0)

## 2019-09-29 LAB — CBC
HCT: 38.2 % (ref 36.0–46.0)
Hemoglobin: 12.5 g/dL (ref 12.0–15.0)
MCH: 31.8 pg (ref 26.0–34.0)
MCHC: 32.7 g/dL (ref 30.0–36.0)
MCV: 97.2 fL (ref 80.0–100.0)
Platelets: 206 10*3/uL (ref 150–400)
RBC: 3.93 MIL/uL (ref 3.87–5.11)
RDW: 13.4 % (ref 11.5–15.5)
WBC: 7.5 10*3/uL (ref 4.0–10.5)
nRBC: 0 % (ref 0.0–0.2)

## 2019-09-29 LAB — BASIC METABOLIC PANEL
Anion gap: 9 (ref 5–15)
BUN: 15 mg/dL (ref 8–23)
CO2: 24 mmol/L (ref 22–32)
Calcium: 8.2 mg/dL — ABNORMAL LOW (ref 8.9–10.3)
Chloride: 101 mmol/L (ref 98–111)
Creatinine, Ser: 0.57 mg/dL (ref 0.44–1.00)
GFR calc Af Amer: >60 mL/min (ref >60)
GFR calc non Af Amer: >60 mL/min (ref >60)
Glucose, Bld: 98 mg/dL (ref 70–99)
Potassium: 3.5 mmol/L (ref 3.5–5.1)
Sodium: 134 mmol/L — ABNORMAL LOW (ref 135–145)

## 2019-09-29 MED ORDER — POLYETHYLENE GLYCOL 3350 17 G PO PACK: 17.0000 g | PACK | Freq: Every day | ORAL | 0 refills | Status: DC | PRN

## 2019-09-29 MED ORDER — HYDROCODONE-ACETAMINOPHEN 5-325 MG PO TABS: 1.0000 | ORAL_TABLET | Freq: Four times a day (QID) | ORAL | 0 refills | Status: DC | PRN

## 2019-09-29 MED ORDER — METHOCARBAMOL 500 MG PO TABS: 500.0000 mg | ORAL_TABLET | Freq: Four times a day (QID) | ORAL | Status: DC | PRN

## 2019-09-29 MED ORDER — SENNOSIDES-DOCUSATE SODIUM 8.6-50 MG PO TABS: 2.0000 | ORAL_TABLET | Freq: Two times a day (BID) | ORAL | Status: DC

## 2019-09-29 MED ORDER — VITAMIN B 12 500 MCG PO TABS: 500.0000 ug | ORAL_TABLET | Freq: Every day | ORAL | Status: AC

## 2019-09-29 MED ORDER — ENOXAPARIN SODIUM 40 MG/0.4ML ~~LOC~~ SOLN
40.0000 mg | SUBCUTANEOUS | 0 refills | Status: DC
Start: 2019-09-29 — End: 2019-11-10

## 2019-09-29 NOTE — Progress Notes (Signed)
Patient transferred to [facility]. Left at 19:57. Belongings sent with the pt.

## 2019-09-29 NOTE — Discharge Summary (Addendum)
Heidi [name] PXT:[id number] DOB: Sep 11, 1934 DOA: 09/25/2019  PCP: Crist Infante, MD  Admit date: 09/25/2019 Discharge date: 09/29/2019  Admitted From: Home Disposition: SNF  Recommendations for Outpatient Follow-up:  1. Follow up with PCP in 1-2 weeks 2. Maintain removable wrist brace.  Nonweightbearing of left wrist.  Okay for weightbearing/range of motion to elbow.  Weightbearing as tolerated for right knee, must keep knee fully extended in knee immobilizer at all times.  Maintain dressings until surgical follow-up with Dr. Percell Miller in office in 10 to 14 days 3. DVT prophylaxis with Lovenox, until 10/29/2019.  4. Pain control with Robaxin q6 HRS PRN muscle spasms, Norco every 6 hours as needed for severe pain, MiraLAX as needed, senna docusate twice daily   Equipment/Devices: Left wrist brace, right knee immobilizer, DME platform walker Incentive Spirometry    Discharge Condition: Stable CODE STATUS: Full   Brief/Interim Summary: History of present illness:  Heidi [name] is a 85 y.o. year old female with medical history significant for arthritis, UC (left sided, dx 14 yrs ago, treated with mesalamine), osteoporosis, HLD, HTN, macular degeneration, GERD, diverticulosis, PBC (on ursodiol) who presented on 09/25/2019 decreased ability to bear weight on right lower extremity and left for swelling after ground-level mechanical fall.  Patient [place] the dog park and fell onto the concrete, patient did not have any head trauma or LOC, was able to brace her fall but hit both knees on the ground.  Patient was found to have right patellar fracture and left distal radius fracture with laceration of lip on imaging in the ED.  She underwent open reduction internal fixation of both left wrist and right patella by Dr. Percell Miller on 09/27/2019. Remaining hospital course addressed in problem based format below:   Hospital Course:   Right patellar fracture, status post ORIF, 09/27/2019 -Ortho  recommends weightbearing as tolerated with knee fully extended and knee immobilizer at all times -Maintain dressings until Ortho follow-up with Dr. Percell Miller in 10 to 14 days -PT/OT recommend SNF on discharge -DVT prophylaxis with Lovenox, until 10/29/2019 -Pain control with Robaxin q6 HRS PRN muscle spasms, norco, every 6 hours as needed for severe pain, MiraLAX as needed, senna docusate twice daily  Left distal radius fracture status post ORIF, 09/27/2019 -Ortho recommends nonweightbearing to left wrist, removable wrist brace and platform walker for DME -Follow with Dr. Percell Miller in 10 to 14 days after discharge   Colitis/primary biliary cirrhosis, stable. -Continue mesalamine, ursodiol  GERD, stable -Continue PPI  HTN, stable -Continue ARB  Urine discoloration.  Denies any dysuria, urgency.  No gross hematuria.  Doubt infectious patient is remained afebrile, no leukocytosis, other than discoloration no overt symptoms. -UA no concerns for infection (rare bacteria, trace leukocytes)   Consultations: Orthopedics  Procedures/Studies: open reduction internal fixation of both left wrist and right patella by Dr. Percell Miller on 09/27/2019 Subjective:  Discharge Exam: Vitals:   09/28/19 2150 09/29/19 0521  BP: (!) 157/70 (!) 156/71  Pulse: 81 72  Resp: 15 15  Temp: 98.2 F (36.8 C) 98.2 F (36.8 C)  SpO2: 97% 98%   Vitals:   09/28/19 1010 09/28/19 1312 09/28/19 2150 09/29/19 0521  BP: 114/60 116/60 (!) 157/70 (!) 156/71  Pulse: 79 74 81 72  Resp: 16 16 15 15   Temp: 98 F (36.7 C) 98.3 F (36.8 C) 98.2 F (36.8 C) 98.2 F (36.8 C)  TempSrc: Oral Oral    SpO2: 95% 97% 97% 98%  Weight:      Height:  Awake Alert, Oriented X 3, Normal affect No new F.N deficits,  [place].AT, Normal respiratory effort on room air CTAB RRR,No Gallops,Rubs or new Murmurs,  +ve B.Sounds, Abd Soft, No tenderness, No rebound, guarding or rigidity. Left wrist brace in place, immobilizer on right knee  in place  Discharge Diagnoses:  Principal Problem:   Multiple fractures Active Problems:   Essential hypertension   Ulcerative colitis ([place])   Biliary cirrhosis ([facility])   Distal radius fracture, left   Patellar fracture    Discharge Instructions  Discharge Instructions    Diet - low sodium heart healthy   Complete by: As directed    Increase activity slowly   Complete by: As directed      Allergies as of 09/29/2019      Reactions   Codeine Nausea And Vomiting   Penicillins Hives      Medication List    TAKE these medications   cetirizine 10 MG tablet Commonly known as: ZYRTEC Take 10 mg by mouth daily.   enoxaparin 40 MG/0.4ML injection Commonly known as: LOVENOX Inject 0.4 mLs (40 mg total) into the skin daily for 30 doses. For 30 days post op for DVT prophylaxis   ezetimibe 10 MG tablet Commonly known as: ZETIA Take 5 mg by mouth 2 (two) times a week. Wednesday and Sunday.   fluticasone 50 MCG/ACT nasal spray Commonly known as: FLONASE   HYDROcodone-acetaminophen 5-325 MG tablet Commonly known as: Norco Take 1-2 tablets by mouth every 6 (six) hours as needed for severe pain.   ibandronate 150 MG tablet Commonly known as: BONIVA Take 150 mg by mouth every 30 (thirty) days.   irbesartan 300 MG tablet Commonly known as: AVAPRO Take 300 mg by mouth every evening.   Livalo 4 MG Tabs Generic drug: Pitavastatin Calcium Take 0.5 tablets by mouth 2 (two) times a week. Wednesday and Sunday   mesalamine 1.2 g EC tablet Commonly known as: LIALDA TAKE TWO TABLETS EVERY DAY WITH BREAKFAST What changed: See the new instructions.   methocarbamol 500 MG tablet Commonly known as: ROBAXIN Take 1 tablet (500 mg total) by mouth every 6 (six) hours as needed for muscle spasms.   omeprazole 20 MG capsule Commonly known as: PRILOSEC TAKE ONE CAPSULE EACH DAY What changed: See the new instructions.   polyethylene glycol 17 g packet Commonly known as: MIRALAX /  GLYCOLAX Take 17 g by mouth daily as needed for mild constipation.   PRESERVISION AREDS PO Take 1 tablet by mouth 2 (two) times daily.   senna-docusate 8.6-50 MG tablet Commonly known as: Senokot-S Take 2 tablets by mouth 2 (two) times daily.   ursodiol 300 MG capsule Commonly known as: ACTIGALL You are past due for an Office visit with Dr. Havery Moros. Last seen in 2019. Please call and schedule as soon as possible. Thank you What changed:   how much to take  how to take this  additional instructions   Vitamin B 12 500 MCG Tabs Take 500 mcg by mouth daily. What changed:   medication strength  how much to take   VITAMIN D PO Take 1 tablet by mouth daily.            Durable Medical Equipment  (From admission, onward)         Start     Ordered   09/27/19 1444  For home use only DME Walker platform  Once    Comments: Right patella and left wrist fracture  Question:  Patient needs  a walker to treat with the following condition  Answer:  Left wrist fracture   09/27/19 1443         Follow-up Information    Renette Butters, MD Follow up.   Specialty: Orthopedic Surgery Contact information: [address] [place] [id number] [phone]          Allergies  Allergen Reactions  . Codeine Nausea And Vomiting  . Penicillins Hives        The results of significant diagnostics from this hospitalization (including imaging, microbiology, ancillary and laboratory) are listed below for reference.     Microbiology: Recent Results (from the past 240 hour(s))  SARS Coronavirus 2 by RT PCR (hospital order, performed in [facility] hospital lab) Nasopharyngeal Nasopharyngeal Swab     Status: None   Collection Time: 09/25/19  1:47 PM   Specimen: Nasopharyngeal Swab  Result Value Ref Range Status   SARS Coronavirus 2 NEGATIVE NEGATIVE Final    Comment: (NOTE) SARS-CoV-2 target nucleic acids are NOT DETECTED. The SARS-CoV-2 RNA is  generally detectable in upper and lower respiratory specimens during the acute phase of infection. The lowest concentration of SARS-CoV-2 viral copies this assay can detect is 250 copies / mL. A negative result does not preclude SARS-CoV-2 infection and should not be used as the sole basis for treatment or other patient management decisions.  A negative result may occur with improper specimen collection / handling, submission of specimen other than nasopharyngeal swab, presence of viral mutation(s) within the areas targeted by this assay, and inadequate number of viral copies (<250 copies / mL). A negative result must be combined with clinical observations, patient history, and epidemiological information. Fact Sheet for Patients:   StrictlyIdeas.no Fact Sheet for Healthcare Providers: BankingDealers.co.za This test is not yet approved or cleared  by the [place] FDA and has been authorized for detection and/or diagnosis of SARS-CoV-2 by FDA under an Emergency Use Authorization (EUA).  This EUA will remain in effect (meaning this test can be used) for the duration of the COVID-19 declaration under Section 564(b)(1) of the Act, 21 U.S.C. section 360bbb-3(b)(1), unless the authorization is terminated or revoked sooner. Performed at [facility], [place] [address] [place], [place] [address]   MRSA PCR Screening     Status: None   Collection Time: 09/26/19  6:16 PM   Specimen: Nasal Mucosa; Nasopharyngeal  Result Value Ref Range Status   MRSA by PCR NEGATIVE NEGATIVE Final    Comment:        The GeneXpert MRSA Assay (FDA approved for NASAL specimens only), is one component of a comprehensive MRSA colonization surveillance program. It is not intended to diagnose MRSA infection nor to guide or monitor treatment for MRSA infections. Performed at [facility], [place] [address] [place], [place]  [address]      Labs: BNP (last 3 results) No results for input(s): BNP in the last 8760 hours. Basic Metabolic Panel: Recent Labs  Lab 09/25/19 1347 09/26/19 0330 09/27/19 0314 09/28/19 0300 09/29/19 0324  NA 139 138 133* 135 134*  K 4.2 3.7 3.9 4.1 3.5  CL 108 106 102 104 101  CO2 24 21* 26 23 24   GLUCOSE 100* 137* 117* 117* 98  BUN 17 13 11 13 15   CREATININE 0.71 0.52 0.56 0.55 0.57  CALCIUM 8.7* 8.7* 8.5* 8.3* 8.2*  MG  --  2.1  --   --   --    Liver Function  Tests: No results for input(s): AST, ALT, ALKPHOS, BILITOT, PROT, ALBUMIN in the last 168 hours. No results for input(s): LIPASE, AMYLASE in the last 168 hours. No results for input(s): AMMONIA in the last 168 hours. CBC: Recent Labs  Lab 09/25/19 1347 09/26/19 0330 09/27/19 0314 09/28/19 0300 09/29/19 0324  WBC 8.4 8.8 8.1 8.0 7.5  NEUTROABS 6.8  --   --   --   --   HGB 14.1 13.6 13.1 12.3 12.5  HCT 43.0 41.1 39.2 37.4 38.2  MCV 98.2 96.3 96.3 97.9 97.2  PLT 223 206 194 178 206   Cardiac Enzymes: No results for input(s): CKTOTAL, CKMB, CKMBINDEX, TROPONINI in the last 168 hours. BNP: Invalid input(s): POCBNP CBG: No results for input(s): GLUCAP in the last 168 hours. D-Dimer No results for input(s): DDIMER in the last 72 hours. Hgb A1c No results for input(s): HGBA1C in the last 72 hours. Lipid Profile No results for input(s): CHOL, HDL, LDLCALC, TRIG, CHOLHDL, LDLDIRECT in the last 72 hours. Thyroid function studies No results for input(s): TSH, T4TOTAL, T3FREE, THYROIDAB in the last 72 hours.  Invalid input(s): FREET3 Anemia work up No results for input(s): VITAMINB12, FOLATE, FERRITIN, TIBC, IRON, RETICCTPCT in the last 72 hours. Urinalysis    Component Value Date/Time   COLORURINE YELLOW 09/13/2012 [place] 09/13/2012 0832   LABSPEC 1.009 09/13/2012 0832   PHURINE 6.0 09/13/2012 0832   GLUCOSEU NEGATIVE 09/13/2012 0832   HGBUR NEGATIVE 09/13/2012 0832   BILIRUBINUR  NEGATIVE 09/13/2012 0832   KETONESUR NEGATIVE 09/13/2012 0832   PROTEINUR NEGATIVE 09/13/2012 0832   UROBILINOGEN 0.2 09/13/2012 0832   NITRITE NEGATIVE 09/13/2012 0832   LEUKOCYTESUR TRACE (A) 09/13/2012 0832   Sepsis Labs Invalid input(s): PROCALCITONIN,  WBC,  LACTICIDVEN Microbiology Recent Results (from the past 240 hour(s))  SARS Coronavirus 2 by RT PCR (hospital order, performed in [place] hospital lab) Nasopharyngeal Nasopharyngeal Swab     Status: None   Collection Time: 09/25/19  1:47 PM   Specimen: Nasopharyngeal Swab  Result Value Ref Range Status   SARS Coronavirus 2 NEGATIVE NEGATIVE Final    Comment: (NOTE) SARS-CoV-2 target nucleic acids are NOT DETECTED. The SARS-CoV-2 RNA is generally detectable in upper and lower respiratory specimens during the acute phase of infection. The lowest concentration of SARS-CoV-2 viral copies this assay can detect is 250 copies / mL. A negative result does not preclude SARS-CoV-2 infection and should not be used as the sole basis for treatment or other patient management decisions.  A negative result may occur with improper specimen collection / handling, submission of specimen other than nasopharyngeal swab, presence of viral mutation(s) within the areas targeted by this assay, and inadequate number of viral copies (<250 copies / mL). A negative result must be combined with clinical observations, patient history, and epidemiological information. Fact Sheet for Patients:   StrictlyIdeas.no Fact Sheet for Healthcare Providers: BankingDealers.co.za This test is not yet approved or cleared  by the [place] FDA and has been authorized for detection and/or diagnosis of SARS-CoV-2 by FDA under an Emergency Use Authorization (EUA).  This EUA will remain in effect (meaning this test can be used) for the duration of the COVID-19 declaration under Section 564(b)(1) of the Act, 21  U.S.C. section 360bbb-3(b)(1), unless the authorization is terminated or revoked sooner. Performed at [facility], [place] [address] [place], [place] [address]   MRSA PCR Screening     Status: None   Collection Time: 09/26/19  6:16 PM   Specimen: Nasal Mucosa; Nasopharyngeal  Result Value Ref Range Status   MRSA by PCR NEGATIVE NEGATIVE Final    Comment:        The GeneXpert MRSA Assay (FDA approved for NASAL specimens only), is one component of a comprehensive MRSA colonization surveillance program. It is not intended to diagnose MRSA infection nor to guide or monitor treatment for MRSA infections. Performed at [facility], [place] [address], [place],  [address]      Time coordinating discharge: Over 30 minutes  SIGNED:   Desiree Hane, MD  [facility] 09/29/2019, 11:44 AM Pager   If 7PM-7AM, please contact night-coverage [email or web address] Password [email or web address]

## 2019-09-29 NOTE — Plan of Care (Signed)
  Problem: Education: Goal: Knowledge of General Education information will improve Description: Including pain rating scale, medication(s)/side effects and non-pharmacologic comfort measures Outcome: Not Progressing   Problem: Health Behavior/Discharge Planning: Goal: Ability to manage health-related needs will improve Outcome: Not Progressing   Problem: Clinical Measurements: Goal: Ability to maintain clinical measurements within normal limits will improve Outcome: Not Progressing   Problem: Clinical Measurements: Goal: Ability to maintain clinical measurements within normal limits will improve Outcome: Not Progressing Goal: Will remain free from infection Outcome: Not Progressing Goal: Diagnostic test results will improve Outcome: Not Progressing Goal: Respiratory complications will improve Outcome: Not Progressing Goal: Cardiovascular complication will be avoided Outcome: Not Progressing   Problem: Nutrition: Goal: Adequate nutrition will be maintained Outcome: Not Progressing   Problem: Coping: Goal: Level of anxiety will decrease Outcome: Not Progressing   Problem: Elimination: Goal: Will not experience complications related to bowel motility Outcome: Not Progressing Goal: Will not experience complications related to urinary retention Outcome: Not Progressing   Problem: Pain Managment: Goal: General experience of comfort will improve Outcome: Not Progressing   Problem: Safety: Goal: Ability to remain free from injury will improve Outcome: Not Progressing   Problem: Skin Integrity: Goal: Risk for impaired skin integrity will decrease Outcome: Not Progressing

## 2019-09-29 NOTE — Plan of Care (Signed)
  Problem: Education: Goal: Knowledge of General Education information will improve Description: Including pain rating scale, medication(s)/side effects and non-pharmacologic comfort measures Outcome: Progressing   Problem: Health Behavior/Discharge Planning: Goal: Ability to manage health-related needs will improve Outcome: Progressing   Problem: Clinical Measurements: Goal: Cardiovascular complication will be avoided Outcome: Progressing   Problem: Activity: Goal: Risk for activity intolerance will decrease Outcome: Progressing   Problem: Coping: Goal: Level of anxiety will decrease Outcome: Progressing   Problem: Nutrition: Goal: Adequate nutrition will be maintained Outcome: Progressing

## 2019-09-29 NOTE — TOC Progression Note (Addendum)
Transition of Care [facility]) - Progression Note    Patient Details  Name: Heidi [name] MRN: [id number] Date of Birth: 1934-11-01  Transition of Care [facility]) CM/SW [place], [place] Phone Number: 09/29/2019, 8:54 AM  Clinical Narrative:    Patient and daughter chose [facility] for rehab placement. Facility will accept covid test from 5/23.  Patient can discharge when medically stable.   Nurse call report to :[phone] Room 205 [facility] called to transport.    Expected Discharge Plan: Skilled Nursing Facility Barriers to Discharge: Barriers Resolved  Expected Discharge Plan and Services Expected Discharge Plan: [place] In-house Referral: Clinical Social Work Discharge Planning Services: CM Consult Post Acute Care Choice: [place] arrangements for the past 2 months: Single Family Home                 DME Arranged: N/A DME Agency: NA       HH Arranged: NA HH Agency: NA         Social Determinants of Health (SDOH) Interventions    Readmission Risk Interventions No flowsheet data found.

## 2019-09-29 NOTE — Op Note (Signed)
09/27/2019  7:08 AM  PATIENT:  [name] DIAGNOSIS:  FRACTURED LEFT WRIST AND FRACTURED RIGHT PATELLA  POST-OPERATIVE DIAGNOSIS:  Same  PROCEDURE:  OPEN REDUCTION INTERNAL FIXATION (ORIF) WRIST FRACTURE, OPEN REDUCTION INTERNAL (ORIF) FIXATION PATELLA  SURGEON:  Renette Butters, MD  ASSISTANT: Roxan Hockey, PA-C, he was present and scrubbed throughout the case, critical for completion in a timely fashion, and for retraction, instrumentation, and closure.   ANESTHESIA:   gen  PREOPERATIVE INDICATIONS:  Heidi [name] is a  85 y.o. female with a diagnosis of FRACTURED LEFT WRIST AND FRACTURED RIGHT PATELLA who failed conservative measures and elected for surgical management.    The risks benefits and alternatives were discussed with the patient preoperatively including but not limited to the risks of infection, bleeding, nerve injury, cardiopulmonary complications, the need for revision surgery, among others, and the patient was willing to proceed.  OPERATIVE IMPLANTS: DVR plate  OPERATIVE FINDINGS: displaced fracture and comminution at the wrist joint. Also comminution at the patella.   BLOOD LOSS: min  COMPLICATIONS: none  TOURNIQUET TIME: 45 on the Wrist and 45 on the Knee  OPERATIVE PROCEDURE:  Patient was identified in the preoperative holding area and site was marked by me She was transported to the operating theater and placed on the table in supine position taking care to pad all bony prominences. After a preincinduction time out anesthesia was induced. The left upper extremity was prepped and draped in normal sterile fashion and a pre-incision timeout was performed. She received ancef for preoperative antibiotics.   I made a 5 cm incision centered over her FCR tendon and dissected down carefully to the level of the flexor tendon sheath and incise this longitudinally and retracted the FCR radially and incised the dorsal aspect of the sheath.   I  bluntly dissected the FPL muscle belly away from the brachioradialis and then sharply incised the pronator tendon from the distal radius and from the wrist capsule. I Elevated this off the bone the fractures visible.   I released the brachioradialis from its insertion. I then debrided the fracture and performed a manual reduction.   I selected a plate and I placed it on the bone. I pinned it into place and was happy on multiple radiographic views with it's placement. I then fixed the plate distally with the locking pegs. I confirmed no articular penetration with the pegs and that none were prominent dorsally.   I then reduced the plate to the shaft improving the volar and radial tilt of her distal radius.  I was happy with the final fluoro xrays    I thoroughly irrigated the wound and closed the pronator over top of the plate and then closed the skin in layers with absorbable stitch. Sterile dressing was applied.  I then turned my attention the the Right lower extremity.   This was prepped and draped and well padded. I inflated the tourniquet.    I made a longitudenal incision over the patella. Displacment was notes. I clamped the patella reduced and placed 2 K wires across this.   I then placed 2 partially threaded screws across the fracture. I was very happy with the alignment. I then placed a fiberwire in a tension band fashion.   I Next placed vicryl stitches repairing the medial collateral capsul  I next placed vicryl stitches reparing the lateral collateral capsul.   Incision was irrigate as was the joint. I closed in layers.  Sterile dressing and a knee immobilizer placed.   POST OPERATIVE PLAN: arm: NWB, Splint full time. Ambulate for DVT px and chemical dvt px. R leg - WBAT in brace.

## 2019-09-29 NOTE — Progress Notes (Signed)
Physical Therapy Treatment Patient Details Name: Heidi [name] MRN: [id number] DOB: [birth year] Today's Date: 09/29/2019    History of Present Illness Pt is an 85 year old female admitted after fall and resulting left distal radial fracture s/p ORIF and right patella fracture s/p ORIF.    PT Comments    Pt assisted with ambulating in hallway and reports likely d/c to SNF today.   Follow Up Recommendations  SNF     Equipment Recommendations  3in1 (PT);Other (comment)(left PFRW if home)    Recommendations for Other Services       Precautions / Restrictions Precautions Precautions: Knee;Fall Required Braces or Orthoses: Knee Immobilizer - Right;Splint/Cast Knee Immobilizer - Right: On at all times Splint/Cast: L wrist splint/brace in place Restrictions Weight Bearing Restrictions: Yes LUE Weight Bearing: Weight bear through elbow only RLE Weight Bearing: Weight bearing as tolerated    Mobility  Bed Mobility Overal bed mobility: Needs Assistance Bed Mobility: Supine to Sit     Supine to sit: Min assist;HOB elevated     General bed mobility comments: cues not to push through L hand, cues for sequencing; assist for R LE  Transfers Overall transfer level: Needs assistance Equipment used: Left platform walker Transfers: Sit to/from Stand Sit to Stand: Mod assist         General transfer comment: verbal cues for UE and LE positioning; assist to rise and steady as well as control descent  Ambulation/Gait Ambulation/Gait assistance: Min guard Gait Distance (Feet): 30 Feet Assistive device: Left platform walker Gait Pattern/deviations: Step-to pattern;Decreased stance time - right;Antalgic     General Gait Details: verbal cues for sequence and walker positioning; distance to tolerance   Stairs             Wheelchair Mobility    Modified Rankin (Stroke Patients Only)       Balance                                             Cognition Arousal/Alertness: Awake/alert Behavior During Therapy: WFL for tasks assessed/performed Overall Cognitive Status: Within Functional Limits for tasks assessed                                        Exercises      General Comments        Pertinent Vitals/Pain Pain Assessment: 0-10 Pain Score: 5  Pain Location: R knee, L wrist Pain Descriptors / Indicators: Aching;Sore Pain Intervention(s): Repositioned;Monitored during session    Home Living                      Prior Function            PT Goals (current goals can now be found in the care plan section) Progress towards PT goals: Progressing toward goals    Frequency    Min 3X/week      PT Plan Current plan remains appropriate    Co-evaluation              AM-PAC PT "6 Clicks" Mobility   Outcome Measure  Help needed turning from your back to your side while in a flat bed without using bedrails?: A Little Help needed moving from lying on your back to sitting on the side  of a flat bed without using bedrails?: A Little Help needed moving to and from a bed to a chair (including a wheelchair)?: A Lot Help needed standing up from a chair using your arms (e.g., wheelchair or bedside chair)?: A Lot Help needed to walk in hospital room?: A Little Help needed climbing 3-5 steps with a railing? : A Lot 6 Click Score: 15    End of Session Equipment Utilized During Treatment: Gait belt Activity Tolerance: Patient tolerated treatment well Patient left: in chair;with call bell/phone within reach;with chair alarm set;with family/visitor present Nurse Communication: Mobility status PT Visit Diagnosis: Other abnormalities of gait and mobility (R26.89)     Time: 8563-1497 PT Time Calculation (min) (ACUTE ONLY): 16 min  Charges:  $Gait Training: 8-22 mins                     Arlyce Dice, DPT Acute Rehabilitation Services Office: [phone]   Trena Platt 09/29/2019, 4:05  PM

## 2019-09-29 NOTE — Progress Notes (Signed)
Spoke with Caryl Pina RN at [facility]  And verbal report given.

## 2019-09-29 NOTE — Progress Notes (Signed)
    Subjective: Patient reports pain as mild, controlled.  Good early mobilization with therapies.  Objective:   VITALS:   Vitals:   09/28/19 1010 09/28/19 1312 09/28/19 2150 09/29/19 0521  BP: 114/60 116/60 (!) 157/70 (!) 156/71  Pulse: 79 74 81 72  Resp: 16 16 15 15   Temp: 98 F (36.7 C) 98.3 F (36.8 C) 98.2 F (36.8 C) 98.2 F (36.8 C)  TempSrc: Oral Oral    SpO2: 95% 97% 97% 98%  Weight:      Height:       CBC Latest Ref Rng & Units 09/29/2019 09/28/2019 09/27/2019  WBC 4.0 - 10.5 K/uL 7.5 8.0 8.1  Hemoglobin 12.0 - 15.0 g/dL 12.5 12.3 13.1  Hematocrit 36.0 - 46.0 % 38.2 37.4 39.2  Platelets 150 - 400 K/uL 206 178 194   BMP Latest Ref Rng & Units 09/29/2019 09/28/2019 09/27/2019  Glucose 70 - 99 mg/dL 98 117(H) 117(H)  BUN 8 - 23 mg/dL 15 13 11   Creatinine 0.44 - 1.00 mg/dL 0.57 0.55 0.56  Sodium 135 - 145 mmol/L 134(L) 135 133(L)  Potassium 3.5 - 5.1 mmol/L 3.5 4.1 3.9  Chloride 98 - 111 mmol/L 101 104 102  CO2 22 - 32 mmol/L 24 23 26   Calcium 8.9 - 10.3 mg/dL 8.2(L) 8.3(L) 8.5(L)   Intake/Output      05/26 0701 - 05/27 0700   P.O. 600   Total Intake(mL/kg) 600 (10)   Urine (mL/kg/hr) 500 (0.3)   Emesis/NG output 0   Stool 0   Total Output 500   Net +100       Urine Occurrence 1 x   Stool Occurrence 0 x   Emesis Occurrence 0 x      Physical Exam: General: NAD.  Upright in bed.  Calm, conversant. MSK LUE: Hand warm.  Sensation intact.  Fingers with moderated expected swelling and bruising.    RLE: Knee immobilizer in place. Feet warm. Sensation intact distally Dorsiflexion/Plantar flexion intact Incision: dressing C/D/I   Assessment / Plan: 2 Days Post-Op  S/P Procedure(s) (LRB): OPEN REDUCTION INTERNAL FIXATION (ORIF) WRIST FRACTURE (Left) OPEN REDUCTION INTERNAL (ORIF) FIXATION PATELLA (Right) by Dr. Ernesta Amble. Percell Miller on 09/27/2019  Principal Problem:   Multiple fractures Active Problems:   Essential hypertension   Ulcerative colitis  ([place])   Biliary cirrhosis ([place])   Distal radius fracture, left   Patellar fracture   Left distal radius fracture Status post ORIF Maintain removable wrist brace NWB left wrist.  Okay for full WB/ROM to elbow  Right patella fracture Status post ORIF Weightbearing as tolerated-must keep knee fully extended in knee immobilizer at all times. Maintain dressings until follow-up.  DME platform walker Incentive Spirometry  Elevate Ice as needed Lovenox for DVT prophylaxis  Contact information:  Edmonia Lynch MD, Roxan Hockey PA-C  Dispo:  Stable for discharge from an orthopedic perspective.  Therapy evaluations ongoing.  Discharge when ready medically and mobilized.  SNF planning in progress.   Follow-up with Dr. Percell Miller in the office in 10 to 14 days.  Please call with questions.   Prudencio Burly III, PA-C 09/29/2019, 6:41 AM

## 2019-10-17 DIAGNOSIS — K5900 Constipation, unspecified: Secondary | ICD-10-CM | POA: Diagnosis not present

## 2019-10-17 DIAGNOSIS — S52502D Unspecified fracture of the lower end of left radius, subsequent encounter for closed fracture with routine healing: Secondary | ICD-10-CM | POA: Diagnosis not present

## 2019-10-17 DIAGNOSIS — Z9181 History of falling: Secondary | ICD-10-CM | POA: Diagnosis not present

## 2019-10-17 DIAGNOSIS — H3530 Unspecified macular degeneration: Secondary | ICD-10-CM | POA: Diagnosis not present

## 2019-10-17 DIAGNOSIS — K219 Gastro-esophageal reflux disease without esophagitis: Secondary | ICD-10-CM | POA: Diagnosis not present

## 2019-10-17 DIAGNOSIS — Z7901 Long term (current) use of anticoagulants: Secondary | ICD-10-CM | POA: Diagnosis not present

## 2019-10-17 DIAGNOSIS — J309 Allergic rhinitis, unspecified: Secondary | ICD-10-CM | POA: Diagnosis not present

## 2019-10-17 DIAGNOSIS — S82044D Nondisplaced comminuted fracture of right patella, subsequent encounter for closed fracture with routine healing: Secondary | ICD-10-CM | POA: Diagnosis not present

## 2019-10-17 DIAGNOSIS — Z85038 Personal history of other malignant neoplasm of large intestine: Secondary | ICD-10-CM | POA: Diagnosis not present

## 2019-10-17 DIAGNOSIS — K5190 Ulcerative colitis, unspecified, without complications: Secondary | ICD-10-CM | POA: Diagnosis not present

## 2019-10-17 DIAGNOSIS — H9193 Unspecified hearing loss, bilateral: Secondary | ICD-10-CM | POA: Diagnosis not present

## 2019-10-17 DIAGNOSIS — Z87891 Personal history of nicotine dependence: Secondary | ICD-10-CM | POA: Diagnosis not present

## 2019-10-17 DIAGNOSIS — M810 Age-related osteoporosis without current pathological fracture: Secondary | ICD-10-CM | POA: Diagnosis not present

## 2019-10-17 DIAGNOSIS — E785 Hyperlipidemia, unspecified: Secondary | ICD-10-CM | POA: Diagnosis not present

## 2019-10-17 DIAGNOSIS — K5790 Diverticulosis of intestine, part unspecified, without perforation or abscess without bleeding: Secondary | ICD-10-CM | POA: Diagnosis not present

## 2019-10-17 DIAGNOSIS — K743 Primary biliary cirrhosis: Secondary | ICD-10-CM | POA: Diagnosis not present

## 2019-10-17 DIAGNOSIS — M419 Scoliosis, unspecified: Secondary | ICD-10-CM | POA: Diagnosis not present

## 2019-10-17 DIAGNOSIS — M170 Bilateral primary osteoarthritis of knee: Secondary | ICD-10-CM | POA: Diagnosis not present

## 2019-10-17 DIAGNOSIS — I10 Essential (primary) hypertension: Secondary | ICD-10-CM | POA: Diagnosis not present

## 2019-10-18 ENCOUNTER — Other Ambulatory Visit: Payer: Self-pay | Admitting: *Deleted

## 2019-10-18 NOTE — Patient Outreach (Signed)
Member screened for potential [facility] Care Management needs as a benefit of [place] [employer].  Verified in Patient Heidi [name] that Heidi [name] transitioned from [facility] on Saturday, June 12th.   Telephone call made to Heidi [name] [phone]. Patient identifiers confirmed. Heidi [name] endorses she is doing pretty well. Confirms she has [facility] and that she lives with daughter. Denies having any [facility] Care Management needs. Reports all of her needs are being met and she has no further concerns after her daughter was able to get prescription called in from MD office. Expressed appreciation of call.  No identifiable [facility] Care Management needs.    Heidi Rolling, MSN-Ed, RN,BSN [place] Acute Care Coordinator [phone] [facility]) [phone]  (Toll free office)

## 2019-10-19 DIAGNOSIS — M170 Bilateral primary osteoarthritis of knee: Secondary | ICD-10-CM | POA: Diagnosis not present

## 2019-10-19 DIAGNOSIS — S52502D Unspecified fracture of the lower end of left radius, subsequent encounter for closed fracture with routine healing: Secondary | ICD-10-CM | POA: Diagnosis not present

## 2019-10-19 DIAGNOSIS — K5190 Ulcerative colitis, unspecified, without complications: Secondary | ICD-10-CM | POA: Diagnosis not present

## 2019-10-19 DIAGNOSIS — I10 Essential (primary) hypertension: Secondary | ICD-10-CM | POA: Diagnosis not present

## 2019-10-19 DIAGNOSIS — S82044D Nondisplaced comminuted fracture of right patella, subsequent encounter for closed fracture with routine healing: Secondary | ICD-10-CM | POA: Diagnosis not present

## 2019-10-19 DIAGNOSIS — K743 Primary biliary cirrhosis: Secondary | ICD-10-CM | POA: Diagnosis not present

## 2019-10-20 DIAGNOSIS — M170 Bilateral primary osteoarthritis of knee: Secondary | ICD-10-CM | POA: Diagnosis not present

## 2019-10-20 DIAGNOSIS — K5190 Ulcerative colitis, unspecified, without complications: Secondary | ICD-10-CM | POA: Diagnosis not present

## 2019-10-20 DIAGNOSIS — S52502D Unspecified fracture of the lower end of left radius, subsequent encounter for closed fracture with routine healing: Secondary | ICD-10-CM | POA: Diagnosis not present

## 2019-10-20 DIAGNOSIS — K743 Primary biliary cirrhosis: Secondary | ICD-10-CM | POA: Diagnosis not present

## 2019-10-20 DIAGNOSIS — S82044D Nondisplaced comminuted fracture of right patella, subsequent encounter for closed fracture with routine healing: Secondary | ICD-10-CM | POA: Diagnosis not present

## 2019-10-20 DIAGNOSIS — I10 Essential (primary) hypertension: Secondary | ICD-10-CM | POA: Diagnosis not present

## 2019-10-24 DIAGNOSIS — S52502D Unspecified fracture of the lower end of left radius, subsequent encounter for closed fracture with routine healing: Secondary | ICD-10-CM | POA: Diagnosis not present

## 2019-10-24 DIAGNOSIS — M170 Bilateral primary osteoarthritis of knee: Secondary | ICD-10-CM | POA: Diagnosis not present

## 2019-10-24 DIAGNOSIS — S82044D Nondisplaced comminuted fracture of right patella, subsequent encounter for closed fracture with routine healing: Secondary | ICD-10-CM | POA: Diagnosis not present

## 2019-10-24 DIAGNOSIS — I10 Essential (primary) hypertension: Secondary | ICD-10-CM | POA: Diagnosis not present

## 2019-10-24 DIAGNOSIS — K5190 Ulcerative colitis, unspecified, without complications: Secondary | ICD-10-CM | POA: Diagnosis not present

## 2019-10-24 DIAGNOSIS — K743 Primary biliary cirrhosis: Secondary | ICD-10-CM | POA: Diagnosis not present

## 2019-10-25 DIAGNOSIS — K743 Primary biliary cirrhosis: Secondary | ICD-10-CM | POA: Diagnosis not present

## 2019-10-25 DIAGNOSIS — M170 Bilateral primary osteoarthritis of knee: Secondary | ICD-10-CM | POA: Diagnosis not present

## 2019-10-25 DIAGNOSIS — I10 Essential (primary) hypertension: Secondary | ICD-10-CM | POA: Diagnosis not present

## 2019-10-25 DIAGNOSIS — K5190 Ulcerative colitis, unspecified, without complications: Secondary | ICD-10-CM | POA: Diagnosis not present

## 2019-10-25 DIAGNOSIS — S52502D Unspecified fracture of the lower end of left radius, subsequent encounter for closed fracture with routine healing: Secondary | ICD-10-CM | POA: Diagnosis not present

## 2019-10-25 DIAGNOSIS — S82044D Nondisplaced comminuted fracture of right patella, subsequent encounter for closed fracture with routine healing: Secondary | ICD-10-CM | POA: Diagnosis not present

## 2019-10-27 DIAGNOSIS — M170 Bilateral primary osteoarthritis of knee: Secondary | ICD-10-CM | POA: Diagnosis not present

## 2019-10-27 DIAGNOSIS — K743 Primary biliary cirrhosis: Secondary | ICD-10-CM | POA: Diagnosis not present

## 2019-10-27 DIAGNOSIS — K5190 Ulcerative colitis, unspecified, without complications: Secondary | ICD-10-CM | POA: Diagnosis not present

## 2019-10-27 DIAGNOSIS — I10 Essential (primary) hypertension: Secondary | ICD-10-CM | POA: Diagnosis not present

## 2019-10-27 DIAGNOSIS — S82044D Nondisplaced comminuted fracture of right patella, subsequent encounter for closed fracture with routine healing: Secondary | ICD-10-CM | POA: Diagnosis not present

## 2019-10-27 DIAGNOSIS — S52502D Unspecified fracture of the lower end of left radius, subsequent encounter for closed fracture with routine healing: Secondary | ICD-10-CM | POA: Diagnosis not present

## 2019-11-01 DIAGNOSIS — S82044D Nondisplaced comminuted fracture of right patella, subsequent encounter for closed fracture with routine healing: Secondary | ICD-10-CM | POA: Diagnosis not present

## 2019-11-01 DIAGNOSIS — K743 Primary biliary cirrhosis: Secondary | ICD-10-CM | POA: Diagnosis not present

## 2019-11-01 DIAGNOSIS — I10 Essential (primary) hypertension: Secondary | ICD-10-CM | POA: Diagnosis not present

## 2019-11-01 DIAGNOSIS — S52502D Unspecified fracture of the lower end of left radius, subsequent encounter for closed fracture with routine healing: Secondary | ICD-10-CM | POA: Diagnosis not present

## 2019-11-01 DIAGNOSIS — K5190 Ulcerative colitis, unspecified, without complications: Secondary | ICD-10-CM | POA: Diagnosis not present

## 2019-11-01 DIAGNOSIS — M170 Bilateral primary osteoarthritis of knee: Secondary | ICD-10-CM | POA: Diagnosis not present

## 2019-11-09 DIAGNOSIS — M25532 Pain in left wrist: Secondary | ICD-10-CM | POA: Diagnosis not present

## 2019-11-09 DIAGNOSIS — M25561 Pain in right knee: Secondary | ICD-10-CM | POA: Diagnosis not present

## 2019-11-10 ENCOUNTER — Other Ambulatory Visit (INDEPENDENT_AMBULATORY_CARE_PROVIDER_SITE_OTHER): Payer: Medicare Other

## 2019-11-10 ENCOUNTER — Encounter: Payer: Self-pay | Admitting: Gastroenterology

## 2019-11-10 ENCOUNTER — Ambulatory Visit (INDEPENDENT_AMBULATORY_CARE_PROVIDER_SITE_OTHER): Payer: Medicare Other | Admitting: Gastroenterology

## 2019-11-10 ENCOUNTER — Telehealth: Payer: Self-pay | Admitting: Gastroenterology

## 2019-11-10 VITALS — BP 120/74 | HR 80 | Ht 62.0 in | Wt 124.8 lb

## 2019-11-10 DIAGNOSIS — Z85038 Personal history of other malignant neoplasm of large intestine: Secondary | ICD-10-CM

## 2019-11-10 DIAGNOSIS — K743 Primary biliary cirrhosis: Secondary | ICD-10-CM

## 2019-11-10 DIAGNOSIS — R198 Other specified symptoms and signs involving the digestive system and abdomen: Secondary | ICD-10-CM

## 2019-11-10 DIAGNOSIS — K5190 Ulcerative colitis, unspecified, without complications: Secondary | ICD-10-CM

## 2019-11-10 LAB — HEPATIC FUNCTION PANEL
ALT: 10 U/L (ref 0–35)
AST: 13 U/L (ref 0–37)
Albumin: 4.2 g/dL (ref 3.5–5.2)
Alkaline Phosphatase: 70 U/L (ref 39–117)
Bilirubin, Direct: 0.1 mg/dL (ref 0.0–0.3)
Total Bilirubin: 0.7 mg/dL (ref 0.2–1.2)
Total Protein: 7.1 g/dL (ref 6.0–8.3)

## 2019-11-10 LAB — TSH: TSH: 1.66 u[IU]/mL (ref 0.35–4.50)

## 2019-11-10 MED ORDER — URSODIOL 300 MG PO CAPS: ORAL_CAPSULE | ORAL | 3 refills | Status: DC

## 2019-11-10 MED ORDER — BALSALAZIDE DISODIUM 750 MG PO CAPS
1500.0000 mg | ORAL_CAPSULE | Freq: Two times a day (BID) | ORAL | 3 refills | Status: DC
Start: 2019-11-10 — End: 2019-11-11

## 2019-11-10 NOTE — Telephone Encounter (Signed)
Pt's daughter Henderson Newcomer called to inform that mesalamine is more affordable at [facility] on [address] in [place] so pt would like that prescription sent there. She stated that prescription for Ursodiol should go to pt's usual pharmacy [facility].

## 2019-11-10 NOTE — Patient Instructions (Addendum)
If you are age 83 or older, your body mass index should be between 23-30. Your Body mass index is 22.83 kg/m. If this is out of the aforementioned range listed, please consider follow up with your Primary Care Provider.  If you are age 59 or younger, your body mass index should be between 19-25. Your Body mass index is 22.83 kg/m. If this is out of the aformentioned range listed, please consider follow up with your Primary Care Provider.    Please go to the lab in the basement of our building to have lab work done as you leave today. Hit "B" for basement when you get on the elevator.  When the doors open the lab is on your left.  We will call you with the results. Thank you.  We have sent the following medications to your pharmacy for you to pick up at your convenience: Ursodiol  Please contact your insurance provider about which mesalamine would have the best coverage.  You will be due for an office visit in 6 months. We will send you a reminder in the mail when it gets closer to that time.  Thank you for entrusting me with your care and for choosing [facility], Dr. Trousdale Cellar

## 2019-11-10 NOTE — Telephone Encounter (Signed)
Per [place] OK to send balsalazide to pharmacy in place of Lialda to see if more cost effective for patient. Scripts sent to [facility].

## 2019-11-10 NOTE — Progress Notes (Signed)
HPI :  85 y/o female here for a follow up visit to discuss issues as outlined:  UC - left sided colitis. Dx 15 years ago. Treated with mesalamine monotherapy over the years.  Over time she has been doing really well without any significant problems or flares.  Since have last seen her she was unfortunately hospitalized with a fracture of her leg and her wrist in May and required surgery.  Fortunately her bowels have not caused her any problems during this time.  She has some occasional rare loose stool but mostly she thinks this is due to dietary indiscretion.  She denies any blood in her stools.  No abdominal pain.  She is taking Lialda 2 tablets a day and is compliant with it.  She asks about other options of mesalamine given this is rather expensive for her.  She had a colonoscopy last year in September 2020 which showed no active inflammation and no adenomatous changes.  PBC - diagnosed via liver biopsy around 2008. On Ursodiol with adjusted dosed for weight, 13-49m /kg. Based on her weight she has alternated 6089mone day and then 90062mhe next. US [place] in January2017showing dilated CBD to 8.6mm39mhis led to subsequent MRCP which showed 8mm 68m, per radiology within normal age related change. No abnormality otherwise. LFTs were otherwise normal.History of osteopenia followed by her primary care, vitamin D normal.  She is due for TSH testing.  Colonoscopy 10/10/2016 - healthy colo-colonic anastomosis in the ascending colon. terminal ileum appeared normal. Many medium-mouthed diverticula were found in the left colon. A 10 mm polyp was found in the descending colon. The exam was otherwise without abnormality. Biopsies were taken with a cold forceps from the left colon for ulcerative colitis surveillance.  Colonoscopy 10/08/2017 - healthy ileo-colonic anastomosis in the ascending colon. the terminal ileum appeared normal. - Two flat polyps were found in the ascending colon. The polyps were 3 to 4  mm in size. Two flat polyps were found in the descending colon. The polyps were 3 to 4 mm in size. A 3 mm polyp was found in the sigmoid colon. The polyp was sessile. Multiple medium-mouthed diverticula were found in the left colon. Internal hemorrhoids were found during retroflexion. Minimally active colitis  Colonoscopy 01/19/2019 - - The perianal and digital rectal examinations were normal. - The terminal ileum appeared normal. - There was evidence of a prior end-to-side ileo-colonic anastomosis in the ascending colon. This was patent and was characterized by healthy appearing mucosa. - A 3 mm polyp was found in the transverse colon. The polyp was flat. The polyp was removed with a cold snare. Resection and retrieval were complete. - Numerous flat polyps were noted in the rectum and rectosigmoid colon consistent with benign hyperplastic polyps. Two representative polyps in the recto-sigmoid colon, 5 to 6 mm in size, were removed with a cold snare as a representative sample. Resection and retrieval were complete. - Multiple large-mouthed diverticula were found in the sigmoid colon. - The exam was otherwise without abnormality. - Biopsies were taken with a cold forceps from the descending colon, sigmoid colon and rectum for ulcerative colitis surveillance. These biopsy specimens were sent to Pathology.  Diagnosis 1. Surgical [P], colon, transverse, polyp - HYPERPLASTIC POLYP. - NO DYSPLASIA OR MALIGNANCY. 2. Surgical [P], left colon - BENIGN COLONIC MUCOSA. - NO ACTIVE INFLAMMATION. - NO DYSPLASIA OR MALIGNANCY. 3. Surgical [P], colon, rectosigmoid, polyp (2) - HYPERPLASTIC POLYP (X2 FRAGMENTS). - NO DYSPLASIA OR MALIGNANCY.  Past Medical History:  Diagnosis Date  . Allergy   . Arthritis   . Basal cell carcinoma of forehead ~ 2008  . Cataract   . Chronic lower back pain   . Cirrhosis, primary biliary   . Diverticulosis of colon   . Dysrhythmia   . GERD  (gastroesophageal reflux disease)   . Hard of hearing    bilat   . Heart murmur   . High cholesterol   . History of hiatal hernia   . Hx of colon cancer, stage I   . Hypertension   . Macular degeneration   . Multiple falls   . Osteoporosis    osteopenia  . Raynaud disease    hands   . Ulcerative colitis   . Venous insufficiency    lower legs     Past Surgical History:  Procedure Laterality Date  . APPENDECTOMY    . CATARACT EXTRACTION W/ INTRAOCULAR LENS  IMPLANT, BILATERAL Bilateral ~ 2008  . COLONOSCOPY    . KNEE ARTHROSCOPY Right 1990's  . KNEE ARTHROSCOPY Left 08/23/2015   Procedure: ARTHROSCOPY KNEE PARTIAL MEDIAL AND LATERAL MENISCECTOMY AND DEBRIDMENT;  Surgeon: Susa Day, MD;  Location: [facility];  Service: Orthopedics;  Laterality: Left;  . LAPAROSCOPIC PARTIAL COLECTOMY N/A 09/15/2012   Procedure: LAPAROSCOPIC ILEOCOLECTOMY;  Surgeon: Stark Klein, MD;  Location: [place];  Service: General;  Laterality: N/A;  . LAPAROSCOPIC RIGHT COLON RESECTION  09/15/2012  . MOUTH SURGERY    . ORIF PATELLA Right 09/27/2019   Procedure: OPEN REDUCTION INTERNAL (ORIF) FIXATION PATELLA;  Surgeon: Renette Butters, MD;  Location: [facility];  Service: Orthopedics;  Laterality: Right;  . ORIF WRIST FRACTURE Left 09/27/2019   Procedure: OPEN REDUCTION INTERNAL FIXATION (ORIF) WRIST FRACTURE;  Surgeon: Renette Butters, MD;  Location: [facility];  Service: Orthopedics;  Laterality: Left;  . POLYPECTOMY    . TONSILLECTOMY  1952  . TUBAL LIGATION  1973   Family History  Problem Relation Age of Onset  . Heart disease Mother   . Heart disease Brother   . Stroke Father   . Lung cancer Brother   . Breast cancer Paternal Aunt   . Ovarian cancer Paternal Grandmother   . Colon cancer Neg Hx   . Esophageal cancer Neg Hx   . Stomach cancer Neg Hx   . Pancreatic cancer Neg Hx   . Rectal cancer Neg Hx    Social History   Tobacco Use  . Smoking status: Former Smoker    Packs/day: 0.50    Years:  40.00    Pack years: 20.00    Types: Cigarettes    Quit date: 01/16/2012    Years since quitting: 7.8  . Smokeless tobacco: Never Used  Vaping Use  . Vaping Use: Never used  Substance Use Topics  . Alcohol use: Yes    Alcohol/week: 10.0 standard drinks    Types: 10 Glasses of wine per week  . Drug use: No   Current Outpatient Medications  Medication Sig Dispense Refill  . acetaminophen (TYLENOL) 500 MG tablet Take 1,000 mg by mouth every 6 (six) hours as needed.    . cetirizine (ZYRTEC) 10 MG tablet Take 10 mg by mouth daily.    . Cholecalciferol (VITAMIN D PO) Take 1 tablet by mouth daily.    . Cyanocobalamin (VITAMIN B 12) 500 MCG TABS Take 500 mcg by mouth daily. 30 tablet   . ezetimibe (ZETIA) 10 MG tablet Take 5 mg by mouth 2 (two)  times a week. Wednesday and Sunday.    . fluticasone (FLONASE) 50 MCG/ACT nasal spray   1  . irbesartan (AVAPRO) 300 MG tablet Take 300 mg by mouth every evening.    . mesalamine (LIALDA) 1.2 g EC tablet TAKE TWO TABLETS EVERY DAY WITH BREAKFAST (Patient taking differently: Take 2.4 g by mouth daily with breakfast. ) 180 tablet 1  . methocarbamol (ROBAXIN) 500 MG tablet Take 1 tablet (500 mg total) by mouth every 6 (six) hours as needed for muscle spasms.    . Multiple Vitamins-Minerals (PRESERVISION AREDS PO) Take 1 tablet by mouth 2 (two) times daily.    Marland Kitchen omeprazole (PRILOSEC) 20 MG capsule TAKE ONE CAPSULE EACH DAY (Patient taking differently: Take 20 mg by mouth daily. ) 30 capsule 6  . Pitavastatin Calcium (LIVALO) 4 MG TABS Take 0.5 tablets by mouth 2 (two) times a week. Wednesday and Sunday    . ursodiol (ACTIGALL) 300 MG capsule You are past due for an Office visit with Dr. Havery Moros. Last seen in 2019. Please call and schedule as soon as possible. Thank you (Patient taking differently: Take 300-600 mg by mouth. Takes 600 mg in the morning and 300 mg in the evening on day and the next day 300 mg twice daily, repeat) 270 capsule 1   No current  facility-administered medications for this visit.   Allergies  Allergen Reactions  . Codeine Nausea And Vomiting  . Penicillins Hives     Review of Systems: All systems reviewed and negative except where noted in HPI.   Lab Results  Component Value Date   WBC 7.5 09/29/2019   HGB 12.5 09/29/2019   HCT 38.2 09/29/2019   MCV 97.2 09/29/2019   PLT 206 09/29/2019    Lab Results  Component Value Date   CREATININE 0.57 09/29/2019   BUN 15 09/29/2019   NA 134 (L) 09/29/2019   K 3.5 09/29/2019   CL 101 09/29/2019   CO2 24 09/29/2019     Physical Exam: BP 120/74   Pulse 80   Ht 5' 2"  (1.575 m)   Wt 124 lb 12.8 oz (56.6 kg)   BMI 22.83 kg/m  Constitutional: Pleasant,well-developed, female in no acute distress. Abdominal: Soft, nondistended, nontender. There are no masses palpable.  Extremities: no edema, R leg in brace Lymphadenopathy: No cervical adenopathy noted. Neurological: Alert and oriented to person place and time. Skin: Skin is warm and dry. No rashes noted. Psychiatric: Normal mood and affect. Behavior is normal.   ASSESSMENT AND PLAN: 85 year old female here for reassessment following:  Ulcerative colitis / history of colon cancer - she continues to do well on mesalamine, her last colonoscopy showed endoscopic and histologic remission which is excellent news.  She had no precancerous polyps as well.  She remains anxious about her risk for colon cancer which I think at this point in her life is low.  We discussed the time at which she wants to stop having further surveillance colonoscopy, I do not feel strongly that she warrants any further exams however she and her daughter remain quite anxious about this and wished to have 1 additional exam before stopping surveillance.  That being said she is recovering from a broken leg and would not do this anytime soon.  I will see her back in 6 months to discuss timing of exam if she really wants another one, she is otherwise  quite healthy.  She does inquire about formulation of mesalamine that might be cheaper for her.  We looked at her insurance and she may have a much cheaper option and balsalazide, dosed at 1500 mg twice a day.  We will try that for her and see how she does.  If she does not feel like this provides the same amount of benefit she can contact us to switch back to Lialda at her convenience.  PBC - doing well, will repeat LFTs and thyroid test to ensure normal.  She is due next year for DEXA scan with Dr. Joylene Draft, no evidence of cirrhosis, continue weight-based dosing of ursodiol 13-14m/kg.   SCarolina Cellar MD [facility]

## 2019-11-11 DIAGNOSIS — I10 Essential (primary) hypertension: Secondary | ICD-10-CM | POA: Diagnosis not present

## 2019-11-11 DIAGNOSIS — S82044D Nondisplaced comminuted fracture of right patella, subsequent encounter for closed fracture with routine healing: Secondary | ICD-10-CM | POA: Diagnosis not present

## 2019-11-11 DIAGNOSIS — M170 Bilateral primary osteoarthritis of knee: Secondary | ICD-10-CM | POA: Diagnosis not present

## 2019-11-11 DIAGNOSIS — S52502D Unspecified fracture of the lower end of left radius, subsequent encounter for closed fracture with routine healing: Secondary | ICD-10-CM | POA: Diagnosis not present

## 2019-11-11 DIAGNOSIS — K5190 Ulcerative colitis, unspecified, without complications: Secondary | ICD-10-CM | POA: Diagnosis not present

## 2019-11-11 DIAGNOSIS — K743 Primary biliary cirrhosis: Secondary | ICD-10-CM | POA: Diagnosis not present

## 2019-11-11 MED ORDER — BALSALAZIDE DISODIUM 750 MG PO CAPS
1500.0000 mg | ORAL_CAPSULE | Freq: Two times a day (BID) | ORAL | 3 refills | Status: DC
Start: 2019-11-11 — End: 2019-12-28

## 2019-11-11 NOTE — Telephone Encounter (Signed)
Called and left a message for the patient. Called the pharmacy and requested that they run the balsalazide and let us know if it is not more cost effective than Lialda for the patient. They will inform the patient of the change.

## 2019-11-11 NOTE — Telephone Encounter (Signed)
Called and spoke to [place], pt's daughter. She spoke to her [employer] provider and they indicated that [facility] would be cheaper for ursodiol but NOT Lialda. Lialda would be $30 at [facility]. I let her know I sent the balsalazide to [facility] and we will wait and see if it is less expensive. If not we can send the Lialda to [facility] or may try the balsalazide at [facility] because [facility] is not a preferred pharmacy. (note: even though not preferred the Ursodiol is still cheaper at [facility] so always send that one to them).

## 2019-11-11 NOTE — Addendum Note (Signed)
Addended by: Roetta Sessions on: 11/11/2019 09:16 AM   Modules accepted: Orders

## 2019-11-14 DIAGNOSIS — S52502D Unspecified fracture of the lower end of left radius, subsequent encounter for closed fracture with routine healing: Secondary | ICD-10-CM | POA: Diagnosis not present

## 2019-11-14 DIAGNOSIS — M170 Bilateral primary osteoarthritis of knee: Secondary | ICD-10-CM | POA: Diagnosis not present

## 2019-11-14 DIAGNOSIS — I10 Essential (primary) hypertension: Secondary | ICD-10-CM | POA: Diagnosis not present

## 2019-11-14 DIAGNOSIS — K743 Primary biliary cirrhosis: Secondary | ICD-10-CM | POA: Diagnosis not present

## 2019-11-14 DIAGNOSIS — K5190 Ulcerative colitis, unspecified, without complications: Secondary | ICD-10-CM | POA: Diagnosis not present

## 2019-11-14 DIAGNOSIS — S82044D Nondisplaced comminuted fracture of right patella, subsequent encounter for closed fracture with routine healing: Secondary | ICD-10-CM | POA: Diagnosis not present

## 2019-11-16 DIAGNOSIS — K5790 Diverticulosis of intestine, part unspecified, without perforation or abscess without bleeding: Secondary | ICD-10-CM | POA: Diagnosis not present

## 2019-11-16 DIAGNOSIS — M810 Age-related osteoporosis without current pathological fracture: Secondary | ICD-10-CM | POA: Diagnosis not present

## 2019-11-16 DIAGNOSIS — J309 Allergic rhinitis, unspecified: Secondary | ICD-10-CM | POA: Diagnosis not present

## 2019-11-16 DIAGNOSIS — S52502D Unspecified fracture of the lower end of left radius, subsequent encounter for closed fracture with routine healing: Secondary | ICD-10-CM | POA: Diagnosis not present

## 2019-11-16 DIAGNOSIS — M170 Bilateral primary osteoarthritis of knee: Secondary | ICD-10-CM | POA: Diagnosis not present

## 2019-11-16 DIAGNOSIS — K219 Gastro-esophageal reflux disease without esophagitis: Secondary | ICD-10-CM | POA: Diagnosis not present

## 2019-11-16 DIAGNOSIS — H3530 Unspecified macular degeneration: Secondary | ICD-10-CM | POA: Diagnosis not present

## 2019-11-16 DIAGNOSIS — Z87891 Personal history of nicotine dependence: Secondary | ICD-10-CM | POA: Diagnosis not present

## 2019-11-16 DIAGNOSIS — K5190 Ulcerative colitis, unspecified, without complications: Secondary | ICD-10-CM | POA: Diagnosis not present

## 2019-11-16 DIAGNOSIS — Z7901 Long term (current) use of anticoagulants: Secondary | ICD-10-CM | POA: Diagnosis not present

## 2019-11-16 DIAGNOSIS — M419 Scoliosis, unspecified: Secondary | ICD-10-CM | POA: Diagnosis not present

## 2019-11-16 DIAGNOSIS — Z9181 History of falling: Secondary | ICD-10-CM | POA: Diagnosis not present

## 2019-11-16 DIAGNOSIS — I10 Essential (primary) hypertension: Secondary | ICD-10-CM | POA: Diagnosis not present

## 2019-11-16 DIAGNOSIS — Z85038 Personal history of other malignant neoplasm of large intestine: Secondary | ICD-10-CM | POA: Diagnosis not present

## 2019-11-16 DIAGNOSIS — K743 Primary biliary cirrhosis: Secondary | ICD-10-CM | POA: Diagnosis not present

## 2019-11-16 DIAGNOSIS — K5900 Constipation, unspecified: Secondary | ICD-10-CM | POA: Diagnosis not present

## 2019-11-16 DIAGNOSIS — H9193 Unspecified hearing loss, bilateral: Secondary | ICD-10-CM | POA: Diagnosis not present

## 2019-11-16 DIAGNOSIS — E785 Hyperlipidemia, unspecified: Secondary | ICD-10-CM | POA: Diagnosis not present

## 2019-11-16 DIAGNOSIS — S82044D Nondisplaced comminuted fracture of right patella, subsequent encounter for closed fracture with routine healing: Secondary | ICD-10-CM | POA: Diagnosis not present

## 2019-11-18 DIAGNOSIS — H903 Sensorineural hearing loss, bilateral: Secondary | ICD-10-CM | POA: Diagnosis not present

## 2019-11-21 DIAGNOSIS — M170 Bilateral primary osteoarthritis of knee: Secondary | ICD-10-CM | POA: Diagnosis not present

## 2019-11-21 DIAGNOSIS — K5190 Ulcerative colitis, unspecified, without complications: Secondary | ICD-10-CM | POA: Diagnosis not present

## 2019-11-21 DIAGNOSIS — S82044D Nondisplaced comminuted fracture of right patella, subsequent encounter for closed fracture with routine healing: Secondary | ICD-10-CM | POA: Diagnosis not present

## 2019-11-21 DIAGNOSIS — K743 Primary biliary cirrhosis: Secondary | ICD-10-CM | POA: Diagnosis not present

## 2019-11-21 DIAGNOSIS — S52502D Unspecified fracture of the lower end of left radius, subsequent encounter for closed fracture with routine healing: Secondary | ICD-10-CM | POA: Diagnosis not present

## 2019-11-21 DIAGNOSIS — I10 Essential (primary) hypertension: Secondary | ICD-10-CM | POA: Diagnosis not present

## 2019-11-23 DIAGNOSIS — M170 Bilateral primary osteoarthritis of knee: Secondary | ICD-10-CM | POA: Diagnosis not present

## 2019-11-23 DIAGNOSIS — S52502D Unspecified fracture of the lower end of left radius, subsequent encounter for closed fracture with routine healing: Secondary | ICD-10-CM | POA: Diagnosis not present

## 2019-11-23 DIAGNOSIS — S82044D Nondisplaced comminuted fracture of right patella, subsequent encounter for closed fracture with routine healing: Secondary | ICD-10-CM | POA: Diagnosis not present

## 2019-11-23 DIAGNOSIS — I10 Essential (primary) hypertension: Secondary | ICD-10-CM | POA: Diagnosis not present

## 2019-11-23 DIAGNOSIS — K743 Primary biliary cirrhosis: Secondary | ICD-10-CM | POA: Diagnosis not present

## 2019-11-23 DIAGNOSIS — K5190 Ulcerative colitis, unspecified, without complications: Secondary | ICD-10-CM | POA: Diagnosis not present

## 2019-11-28 DIAGNOSIS — S52502D Unspecified fracture of the lower end of left radius, subsequent encounter for closed fracture with routine healing: Secondary | ICD-10-CM | POA: Diagnosis not present

## 2019-11-28 DIAGNOSIS — K5190 Ulcerative colitis, unspecified, without complications: Secondary | ICD-10-CM | POA: Diagnosis not present

## 2019-11-28 DIAGNOSIS — K743 Primary biliary cirrhosis: Secondary | ICD-10-CM | POA: Diagnosis not present

## 2019-11-28 DIAGNOSIS — S82044D Nondisplaced comminuted fracture of right patella, subsequent encounter for closed fracture with routine healing: Secondary | ICD-10-CM | POA: Diagnosis not present

## 2019-11-28 DIAGNOSIS — M170 Bilateral primary osteoarthritis of knee: Secondary | ICD-10-CM | POA: Diagnosis not present

## 2019-11-28 DIAGNOSIS — I10 Essential (primary) hypertension: Secondary | ICD-10-CM | POA: Diagnosis not present

## 2019-11-29 DIAGNOSIS — S82044D Nondisplaced comminuted fracture of right patella, subsequent encounter for closed fracture with routine healing: Secondary | ICD-10-CM | POA: Diagnosis not present

## 2019-11-29 DIAGNOSIS — I10 Essential (primary) hypertension: Secondary | ICD-10-CM | POA: Diagnosis not present

## 2019-11-29 DIAGNOSIS — K5190 Ulcerative colitis, unspecified, without complications: Secondary | ICD-10-CM | POA: Diagnosis not present

## 2019-11-29 DIAGNOSIS — M170 Bilateral primary osteoarthritis of knee: Secondary | ICD-10-CM | POA: Diagnosis not present

## 2019-11-29 DIAGNOSIS — S52502D Unspecified fracture of the lower end of left radius, subsequent encounter for closed fracture with routine healing: Secondary | ICD-10-CM | POA: Diagnosis not present

## 2019-11-29 DIAGNOSIS — K743 Primary biliary cirrhosis: Secondary | ICD-10-CM | POA: Diagnosis not present

## 2019-12-06 DIAGNOSIS — K5190 Ulcerative colitis, unspecified, without complications: Secondary | ICD-10-CM | POA: Diagnosis not present

## 2019-12-06 DIAGNOSIS — M170 Bilateral primary osteoarthritis of knee: Secondary | ICD-10-CM | POA: Diagnosis not present

## 2019-12-06 DIAGNOSIS — I10 Essential (primary) hypertension: Secondary | ICD-10-CM | POA: Diagnosis not present

## 2019-12-06 DIAGNOSIS — S82044D Nondisplaced comminuted fracture of right patella, subsequent encounter for closed fracture with routine healing: Secondary | ICD-10-CM | POA: Diagnosis not present

## 2019-12-06 DIAGNOSIS — K743 Primary biliary cirrhosis: Secondary | ICD-10-CM | POA: Diagnosis not present

## 2019-12-06 DIAGNOSIS — S52502D Unspecified fracture of the lower end of left radius, subsequent encounter for closed fracture with routine healing: Secondary | ICD-10-CM | POA: Diagnosis not present

## 2019-12-13 DIAGNOSIS — S52502D Unspecified fracture of the lower end of left radius, subsequent encounter for closed fracture with routine healing: Secondary | ICD-10-CM | POA: Diagnosis not present

## 2019-12-13 DIAGNOSIS — S82044D Nondisplaced comminuted fracture of right patella, subsequent encounter for closed fracture with routine healing: Secondary | ICD-10-CM | POA: Diagnosis not present

## 2019-12-13 DIAGNOSIS — K743 Primary biliary cirrhosis: Secondary | ICD-10-CM | POA: Diagnosis not present

## 2019-12-13 DIAGNOSIS — I10 Essential (primary) hypertension: Secondary | ICD-10-CM | POA: Diagnosis not present

## 2019-12-13 DIAGNOSIS — M170 Bilateral primary osteoarthritis of knee: Secondary | ICD-10-CM | POA: Diagnosis not present

## 2019-12-13 DIAGNOSIS — K5190 Ulcerative colitis, unspecified, without complications: Secondary | ICD-10-CM | POA: Diagnosis not present

## 2019-12-16 DIAGNOSIS — M25561 Pain in right knee: Secondary | ICD-10-CM | POA: Diagnosis not present

## 2019-12-16 DIAGNOSIS — M25532 Pain in left wrist: Secondary | ICD-10-CM | POA: Diagnosis not present

## 2019-12-28 ENCOUNTER — Telehealth: Payer: Self-pay | Admitting: Gastroenterology

## 2019-12-28 MED ORDER — MESALAMINE 1.2 G PO TBEC: DELAYED_RELEASE_TABLET | ORAL | 1 refills | Status: DC

## 2019-12-28 NOTE — Telephone Encounter (Signed)
Pt states that balsalazide is not working for her and she would like to go back to [place]. Pls call her.

## 2019-12-28 NOTE — Telephone Encounter (Signed)
Called and spoke to pt.  She tried the balsalazide but it has not proved to be as effective as Lialda for her.  She has been having a lot of diarrhea. She would like to switch back to [place], 2 tablets daily sent to [facility] in [place].  Pt saw Dr. Havery Moros in the office on 11-10-19. Sent script to pharmacy

## 2019-12-29 ENCOUNTER — Other Ambulatory Visit (HOSPITAL_COMMUNITY): Payer: Self-pay | Admitting: *Deleted

## 2019-12-30 ENCOUNTER — Ambulatory Visit (HOSPITAL_COMMUNITY)
Admission: RE | Admit: 2019-12-30 | Discharge: 2019-12-30 | Disposition: A | Discharge status: Home / Self Care | Payer: Medicare Other | Source: Ambulatory Visit | Attending: Internal Medicine | Admitting: Internal Medicine

## 2019-12-30 ENCOUNTER — Other Ambulatory Visit: Payer: Self-pay

## 2019-12-30 DIAGNOSIS — M810 Age-related osteoporosis without current pathological fracture: Secondary | ICD-10-CM | POA: Insufficient documentation

## 2019-12-30 MED ORDER — DENOSUMAB 60 MG/ML ~~LOC~~ SOSY: 60.0000 mg | PREFILLED_SYRINGE | Freq: Once | SUBCUTANEOUS | Status: AC

## 2019-12-30 MED ORDER — DENOSUMAB 60 MG/ML ~~LOC~~ SOSY
PREFILLED_SYRINGE | SUBCUTANEOUS | Status: AC
  Administered 2019-12-30: 60 mg via SUBCUTANEOUS
  Filled 2019-12-30: qty 1

## 2019-12-30 NOTE — Discharge Instructions (Signed)
Denosumab injection What is this medicine? DENOSUMAB (den oh sue mab) slows bone breakdown. Prolia is used to treat osteoporosis in women after menopause and in men, and in people who are taking corticosteroids for 6 months or more. Xgeva is used to treat a high calcium level due to cancer and to prevent bone fractures and other bone problems caused by multiple myeloma or cancer bone metastases. Xgeva is also used to treat giant cell tumor of the bone. This medicine may be used for other purposes; ask your health care provider or pharmacist if you have questions. COMMON BRAND NAME(S): Prolia, XGEVA What should I tell my health care provider before I take this medicine? They need to know if you have any of these conditions:  dental disease  having surgery or tooth extraction  infection  kidney disease  low levels of calcium or Vitamin D in the blood  malnutrition  on hemodialysis  skin conditions or sensitivity  thyroid or parathyroid disease  an unusual reaction to denosumab, other medicines, foods, dyes, or preservatives  pregnant or trying to get pregnant  breast-feeding How should I use this medicine? This medicine is for injection under the skin. It is given by a health care professional in a hospital or clinic setting. A special MedGuide will be given to you before each treatment. Be sure to read this information carefully each time. For Prolia, talk to your pediatrician regarding the use of this medicine in children. Special care may be needed. For Xgeva, talk to your pediatrician regarding the use of this medicine in children. While this drug may be prescribed for children as young as 13 years for selected conditions, precautions do apply. Overdosage: If you think you have taken too much of this medicine contact a poison control center or emergency room at once. NOTE: This medicine is only for you. Do not share this medicine with others. What if I miss a dose? It is  important not to miss your dose. Call your doctor or health care professional if you are unable to keep an appointment. What may interact with this medicine? Do not take this medicine with any of the following medications:  other medicines containing denosumab This medicine may also interact with the following medications:  medicines that lower your chance of fighting infection  steroid medicines like prednisone or cortisone This list may not describe all possible interactions. Give your health care provider a list of all the medicines, herbs, non-prescription drugs, or dietary supplements you use. Also tell them if you smoke, drink alcohol, or use illegal drugs. Some items may interact with your medicine. What should I watch for while using this medicine? Visit your doctor or health care professional for regular checks on your progress. Your doctor or health care professional may order blood tests and other tests to see how you are doing. Call your doctor or health care professional for advice if you get a fever, chills or sore throat, or other symptoms of a cold or flu. Do not treat yourself. This drug may decrease your body's ability to fight infection. Try to avoid being around people who are sick. You should make sure you get enough calcium and vitamin D while you are taking this medicine, unless your doctor tells you not to. Discuss the foods you eat and the vitamins you take with your health care professional. See your dentist regularly. Brush and floss your teeth as directed. Before you have any dental work done, tell your dentist you are   receiving this medicine. Do not become pregnant while taking this medicine or for 5 months after stopping it. Talk with your doctor or health care professional about your birth control options while taking this medicine. Women should inform their doctor if they wish to become pregnant or think they might be pregnant. There is a potential for serious side  effects to an unborn child. Talk to your health care professional or pharmacist for more information. What side effects may I notice from receiving this medicine? Side effects that you should report to your doctor or health care professional as soon as possible:  allergic reactions like skin rash, itching or hives, swelling of the face, lips, or tongue  bone pain  breathing problems  dizziness  jaw pain, especially after dental work  redness, blistering, peeling of the skin  signs and symptoms of infection like fever or chills; cough; sore throat; pain or trouble passing urine  signs of low calcium like fast heartbeat, muscle cramps or muscle pain; pain, tingling, numbness in the hands or feet; seizures  unusual bleeding or bruising  unusually weak or tired Side effects that usually do not require medical attention (report to your doctor or health care professional if they continue or are bothersome):  constipation  diarrhea  headache  joint pain  loss of appetite  muscle pain  runny nose  tiredness  upset stomach This list may not describe all possible side effects. Call your doctor for medical advice about side effects. You may report side effects to FDA at 1-800-FDA-1088. Where should I keep my medicine? This medicine is only given in a clinic, doctor's office, or other health care setting and will not be stored at home. NOTE: This sheet is a summary. It may not cover all possible information. If you have questions about this medicine, talk to your doctor, pharmacist, or health care provider.  2020 Elsevier/Gold Standard (2017-08-28 16:10:44)

## 2020-01-30 ENCOUNTER — Encounter (HOSPITAL_COMMUNITY): Payer: Medicare Other

## 2020-02-11 DIAGNOSIS — Z23 Encounter for immunization: Secondary | ICD-10-CM | POA: Diagnosis not present

## 2020-02-14 DIAGNOSIS — Z1231 Encounter for screening mammogram for malignant neoplasm of breast: Secondary | ICD-10-CM | POA: Diagnosis not present

## 2020-02-16 DIAGNOSIS — L821 Other seborrheic keratosis: Secondary | ICD-10-CM | POA: Diagnosis not present

## 2020-02-16 DIAGNOSIS — Z85828 Personal history of other malignant neoplasm of skin: Secondary | ICD-10-CM | POA: Diagnosis not present

## 2020-02-16 DIAGNOSIS — L905 Scar conditions and fibrosis of skin: Secondary | ICD-10-CM | POA: Diagnosis not present

## 2020-03-04 DIAGNOSIS — Z23 Encounter for immunization: Secondary | ICD-10-CM | POA: Diagnosis not present

## 2020-03-07 ENCOUNTER — Telehealth: Payer: Self-pay | Admitting: *Deleted

## 2020-03-07 NOTE — Telephone Encounter (Signed)
I have placed a tier exception request through [email or web address] over to [facility] for patient's Lialda/mesalamine.   Patient dx: ulcerative colitis Previous medications tried/failed: rowasa enema, prednisone, proctofoam, balsalazide  We will await insurance response.

## 2020-03-08 ENCOUNTER — Other Ambulatory Visit: Payer: Self-pay | Admitting: Gastroenterology

## 2020-03-08 NOTE — Telephone Encounter (Signed)
Laurann Coppock Key: [id number] help? Call us at [phone] Outcome Approvedtoday Effective from 03/07/2020 through 03/07/2021. Drug Mesalamine 1.2GM dr tablets Form [employer] [place] Medicare Part D Tier Exception Form

## 2020-03-26 ENCOUNTER — Telehealth: Payer: Self-pay | Admitting: Gastroenterology

## 2020-03-26 NOTE — Telephone Encounter (Signed)
Called and spoke to pt. She indicated that her insurance said she could apply for a tier exception for ursodiol (ACTIGALL). They will be sending a form for [place] to complete. Script still  needs to go to [facility] which is cheaper for that medication.

## 2020-03-26 NOTE — Telephone Encounter (Signed)
Pt states [employer] is going to be faxing over a request for the pt's ACTIGALL. Pt wants the nurse to be on the lookout for it.

## 2020-03-27 NOTE — Telephone Encounter (Signed)
Called patient and let her know we have not received a tier exception form to complete from insurance.  Pt indicated she will contact them.  We verified our fax number.

## 2020-04-03 ENCOUNTER — Other Ambulatory Visit: Payer: Self-pay

## 2020-04-03 ENCOUNTER — Encounter (INDEPENDENT_AMBULATORY_CARE_PROVIDER_SITE_OTHER): Payer: Medicare Other | Admitting: Ophthalmology

## 2020-04-03 DIAGNOSIS — I10 Essential (primary) hypertension: Secondary | ICD-10-CM

## 2020-04-03 DIAGNOSIS — H35033 Hypertensive retinopathy, bilateral: Secondary | ICD-10-CM | POA: Diagnosis not present

## 2020-04-03 DIAGNOSIS — H43813 Vitreous degeneration, bilateral: Secondary | ICD-10-CM | POA: Diagnosis not present

## 2020-04-03 DIAGNOSIS — H353132 Nonexudative age-related macular degeneration, bilateral, intermediate dry stage: Secondary | ICD-10-CM | POA: Diagnosis not present

## 2020-04-04 NOTE — Telephone Encounter (Signed)
Received Tier Exception Request Form from BC/BS for ursodiol.  Completed and faxed back. Pt has Primary Biliary Cholangitis.

## 2020-04-09 NOTE — Telephone Encounter (Signed)
Received denial from [employer]/BS [phone], x5) for Tier Exception for Ursodial.  Note: This medication is approved for this condition as a Tier 3 on the member's formulary. Request was for a lower tier at a lower price. Patient taking 300 mg: 2 tablets (600 mg) every other day and 3 tablets (900 mg) every other day, 75 tablets is a 30 day supply.  Currently paying $48.48 according to [facility].  Price may drop back down to her usual co pay after the 1st of the year which had been about $6. FYI: [facility] did not offer better pricing. Called and informed patient.

## 2020-05-12 ENCOUNTER — Other Ambulatory Visit: Payer: Self-pay

## 2020-05-12 MED ORDER — MESALAMINE 1.2 G PO TBEC: 2.4000 g | DELAYED_RELEASE_TABLET | Freq: Every day | ORAL | 1 refills | Status: DC

## 2020-05-12 NOTE — Progress Notes (Signed)
Received refill request from [facility] for Allisonia.  Patient is due for office visit.

## 2020-06-26 ENCOUNTER — Ambulatory Visit (INDEPENDENT_AMBULATORY_CARE_PROVIDER_SITE_OTHER): Payer: Medicare Other | Admitting: Gastroenterology

## 2020-06-26 ENCOUNTER — Encounter: Payer: Self-pay | Admitting: Gastroenterology

## 2020-06-26 VITALS — BP 120/76 | HR 69 | Ht 62.0 in | Wt 131.0 lb

## 2020-06-26 DIAGNOSIS — Z85038 Personal history of other malignant neoplasm of large intestine: Secondary | ICD-10-CM

## 2020-06-26 DIAGNOSIS — K743 Primary biliary cirrhosis: Secondary | ICD-10-CM | POA: Diagnosis not present

## 2020-06-26 DIAGNOSIS — K5130 Ulcerative (chronic) rectosigmoiditis without complications: Secondary | ICD-10-CM | POA: Diagnosis not present

## 2020-06-26 MED ORDER — MESALAMINE 1.2 G PO TBEC: 2.4000 g | DELAYED_RELEASE_TABLET | Freq: Every day | ORAL | 3 refills | Status: DC

## 2020-06-26 NOTE — Patient Instructions (Addendum)
If you are age 84 or older, your body mass index should be between 23-30. Your Body mass index is 23.96 kg/m. If this is out of the aforementioned range listed, please consider follow up with your Primary Care Provider.  If you are age 43 or younger, your body mass index should be between 19-25. Your Body mass index is 23.96 kg/m. If this is out of the aformentioned range listed, please consider follow up with your Primary Care Provider.   We have sent the following medications to your pharmacy for you to pick up at your convenience: Lialda  Please follow up again in September 2022.  Thank you for entrusting me with your care and for choosing [facility], Dr. [place] Cellar  '

## 2020-06-26 NOTE — Progress Notes (Signed)
HPI :  86 y/o female here for a follow up visit to discuss the following issues as outlined:  UC - left sided colitis. Dx 15 years ago. Treated with mesalamine monotherapy over the years.  Over time she has been doing really well without any significant problems or flares.  Since the last time I saw her she was a bit concerned about cost of Lialda, we tried her on balsalazide and she states she did not feel as well on that regimen and wanted to go back on Lialda.  She has been taking that 2 tabs per day.  Her bowels are regular and she denies any persistent diarrhea, no blood in the stools.  No abdominal pains.  She does have some food intolerance and has occasional loose stool, for example with pasta.  Generally happy with the regimen and doing well.  Recall she does have a history of colon cancer in 2014.  She is anxious about not doing further colonoscopy exams however she is turning 47 this April.  We discussed as below.  PBC - diagnosed via liver biopsy around 2008.On Ursodiol withadjusted dosed for weight, 13-31m /kg. Based on her weight she has alternated 6064mone day and then 90018mhe next.   She has had no evidence of cirrhosis on prior imaging or labs.  LFTs were otherwise normal when last checked as well as TSH. History of osteopenia followed by her primary care, vitamin D normal.    She is due to see Dr. ParHaynes Kerns April for a routine follow-up.  Overall patient is doing pretty well, she is recovered from her fractured leg.  She states her insurance company is not covering ursodiol at this time and we discussed that for a bit  Recent colonoscopies: Colonoscopy 10/10/2016 - healthycolo-colonic anastomosis in the ascending colon. terminal ileum appeared normal. Many medium-mouthed diverticula were found in the left colon. A 10 mm polyp was found in the descending colon.The exam was otherwise without abnormality. Biopsies were taken with a cold forceps from the left colon for ulcerative  colitis surveillance.  Colonoscopy 10/08/2017 - healthyileo-colonic anastomosis in the ascending colon. theterminal ileum appeared normal. - Two flat polyps were found in the ascending colon. The polyps were 3 to 4 mm in size. Two flat polyps were found in the descending colon. The polyps were 3 to 4 mm in size. A 3 mm polyp was found in the sigmoid colon. The polyp was sessile. Multiple medium-mouthed diverticula were found in the left colon. Internal hemorrhoids were found during retroflexion.Minimally active colitis  Colonoscopy 01/19/2019 - - The perianal and digital rectal examinations were normal. - The terminal ileum appeared normal. - There was evidence of a prior end-to-side ileo-colonic anastomosis in the ascending colon. This was patent and was characterized by healthy appearing mucosa. - A 3 mm polyp was found in the transverse colon. The polyp was flat. The polyp was removed with a cold snare. Resection and retrieval were complete. - Numerous flat polyps were noted in the rectum and rectosigmoid colon consistent with benign hyperplastic polyps. Two representative polyps in the recto-sigmoid colon, 5 to 6 mm in size, were removed with a cold snare as a representative sample. Resection and retrieval were complete. - Multiple large-mouthed diverticula were found in the sigmoid colon. - The exam was otherwise without abnormality. - Biopsies were taken with a cold forceps from the descending colon, sigmoid colon and rectum for ulcerative colitis surveillance. These biopsy specimens were sent to Pathology.  Diagnosis  1. Surgical [P], colon, transverse, polyp - HYPERPLASTIC POLYP. - NO DYSPLASIA OR MALIGNANCY. 2. Surgical [P], left colon - BENIGN COLONIC MUCOSA. - NO ACTIVE INFLAMMATION. - NO DYSPLASIA OR MALIGNANCY. 3. Surgical [P], colon, rectosigmoid, polyp (2) - HYPERPLASTIC POLYP (X2 FRAGMENTS). - NO DYSPLASIA OR MALIGNANCY.   Past Medical History:  Diagnosis Date  .  Allergy   . Arthritis   . Basal cell carcinoma of forehead ~ 2008  . Cataract   . Chronic lower back pain   . Cirrhosis, primary biliary   . Diverticulosis of colon   . Dysrhythmia   . GERD (gastroesophageal reflux disease)   . Hard of hearing    bilat   . Heart murmur   . High cholesterol   . History of hiatal hernia   . Hx of colon cancer, stage I   . Hypertension   . Macular degeneration   . Multiple falls   . Osteoporosis    osteopenia  . Raynaud disease    hands   . Ulcerative colitis   . Venous insufficiency    lower legs     Past Surgical History:  Procedure Laterality Date  . APPENDECTOMY    . CATARACT EXTRACTION W/ INTRAOCULAR LENS  IMPLANT, BILATERAL Bilateral ~ 2008  . COLONOSCOPY    . KNEE ARTHROSCOPY Right 1990's  . KNEE ARTHROSCOPY Left 08/23/2015   Procedure: ARTHROSCOPY KNEE PARTIAL MEDIAL AND LATERAL MENISCECTOMY AND DEBRIDMENT;  Surgeon: Susa Day, MD;  Location: [facility];  Service: Orthopedics;  Laterality: Left;  . LAPAROSCOPIC PARTIAL COLECTOMY N/A 09/15/2012   Procedure: LAPAROSCOPIC ILEOCOLECTOMY;  Surgeon: Stark Klein, MD;  Location: [place];  Service: General;  Laterality: N/A;  . LAPAROSCOPIC RIGHT COLON RESECTION  09/15/2012  . MOUTH SURGERY    . ORIF PATELLA Right 09/27/2019   Procedure: OPEN REDUCTION INTERNAL (ORIF) FIXATION PATELLA;  Surgeon: Renette Butters, MD;  Location: [facility];  Service: Orthopedics;  Laterality: Right;  . ORIF WRIST FRACTURE Left 09/27/2019   Procedure: OPEN REDUCTION INTERNAL FIXATION (ORIF) WRIST FRACTURE;  Surgeon: Renette Butters, MD;  Location: [facility];  Service: Orthopedics;  Laterality: Left;  . POLYPECTOMY    . TONSILLECTOMY  1952  . TUBAL LIGATION  1973   Family History  Problem Relation Age of Onset  . Heart disease Mother   . Heart disease Brother   . Stroke Father   . Lung cancer Brother   . Breast cancer Paternal Aunt   . Ovarian cancer Paternal Grandmother   . Colon cancer Neg Hx   . Esophageal  cancer Neg Hx   . Stomach cancer Neg Hx   . Pancreatic cancer Neg Hx   . Rectal cancer Neg Hx    Social History   Tobacco Use  . Smoking status: Former Smoker    Packs/day: 0.50    Years: 40.00    Pack years: 20.00    Types: Cigarettes    Quit date: 01/16/2012    Years since quitting: 8.4  . Smokeless tobacco: Never Used  Vaping Use  . Vaping Use: Never used  Substance Use Topics  . Alcohol use: Yes    Alcohol/week: 10.0 standard drinks    Types: 10 Glasses of wine per week  . Drug use: No   Current Outpatient Medications  Medication Sig Dispense Refill  . acetaminophen (TYLENOL) 500 MG tablet Take 1,000 mg by mouth every 6 (six) hours as needed.    . cetirizine (ZYRTEC) 10 MG tablet Take 10 mg  by mouth daily.    . cholecalciferol (VITAMIN D3) 25 MCG (1000 UNIT) tablet Take 1,000 Units by mouth daily.    . Cyanocobalamin (VITAMIN B 12) 500 MCG TABS Take 500 mcg by mouth daily. (Patient taking differently: Take 1,000 mcg by mouth daily.) 30 tablet   . ezetimibe (ZETIA) 10 MG tablet Take 5 mg by mouth 2 (two) times a week. Wednesday and Sunday.    . fluticasone (FLONASE) 50 MCG/ACT nasal spray   1  . irbesartan (AVAPRO) 300 MG tablet Take 300 mg by mouth every evening.    . mesalamine (LIALDA) 1.2 g EC tablet Take 2 tablets (2.4 g total) by mouth daily with breakfast. You are due for an office visit. Please call to schedule. Thank you 60 tablet 1  . Multiple Vitamins-Minerals (PRESERVISION AREDS PO) Take 1 tablet by mouth 2 (two) times daily.    Marland Kitchen omeprazole (PRILOSEC) 20 MG capsule TAKE ONE CAPSULE EACH DAY (Patient taking differently: Take 20 mg by mouth daily.) 30 capsule 6  . Pitavastatin Calcium 4 MG TABS Take 0.5 tablets by mouth 2 (two) times a week. Wednesday and Sunday    . ursodiol (ACTIGALL) 300 MG capsule Take 2 tablets (600 mg) by mouth every other day and 3 tablets (900 mg) by mouth every other day. 230 capsule 3   No current facility-administered medications for  this visit.   Allergies  Allergen Reactions  . Codeine Nausea And Vomiting  . Penicillins Hives     Review of Systems: All systems reviewed and negative except where noted in HPI.   Lab Results  Component Value Date   ALT 10 11/10/2019   AST 13 11/10/2019   ALKPHOS 70 11/10/2019   BILITOT 0.7 11/10/2019    Lab Results  Component Value Date   CREATININE 0.57 09/29/2019   BUN 15 09/29/2019   NA 134 (L) 09/29/2019   K 3.5 09/29/2019   CL 101 09/29/2019   CO2 24 09/29/2019    Lab Results  Component Value Date   INR 1.0 09/27/2019   INR 1.01 08/10/2015   INR 0.92 09/13/2012   Lab Results  Component Value Date   WBC 7.5 09/29/2019   HGB 12.5 09/29/2019   HCT 38.2 09/29/2019   MCV 97.2 09/29/2019   PLT 206 09/29/2019     Physical Exam: BP 120/76   Pulse 69   Ht 5' 2"  (1.575 m)   Wt 131 lb (59.4 kg)   BMI 23.96 kg/m  Constitutional: Pleasant,well-developed, female in no acute distress. Neurological: Alert and oriented to person place and time. Psychiatric: Normal mood and affect. Behavior is normal.   ASSESSMENT AND PLAN: 86 year old female here for reassessment of the following issues:  Ulcerative colitis History of colon cancer PBC  Generally doing well in regards to both the UC and PBC.  We tried transitioning to balsalazide as a cheaper option but she states it did not work as well for her and she is preferred to use Lialda 2.4 g/day and its doing well.  She has no complaints at this time.  Otherwise on ursodiol long-term however states there is an issue with her insurance and cost/coverage.  She has first-line indication for ursodiol, I asked her to look into this with her insurance company and she may appeal it, I am happy to provide any documentation needed for her to continue this.  She does continue to take it and has a supply at home.  She is due for LFTs and  will have that checked with her yearly blood work in April.  She will have TSH checked  annually, and follow-up with Dr. Joylene Draft for her osteopenia.  No evidence of cirrhosis over time and hopefully she will never experience that.  We otherwise discussed surveillance colonoscopy.  She is anxious about stopping surveillance colonoscopy at this time however her colitis has been in good control on recent exams, she has had no high risk lesions on her last few exams.  She is turning 86 years old soon, we discussed risks and benefits of colonoscopy.  I think risks may outweigh benefits at this point in her life and I do not feel strongly that she warrants any further surveillance colonoscopy.  She wants to think about this further and will see me again in September for reassessment, we will hold off on exams at this point.  Plan: - annual blood work in April, will do with Dr. Joylene Draft - include LFTs and TSH - continue Ursodiol at present dose - she should appeal with her insurance company for coverage if this is not currently being covered for her [place] - continue Lialda - do not think she warrants further surveillance colonoscopy at her age, as discussed above, she will see me in 6 months to discuss this further, she is anxious about stopping future surveillance exams.  [place] Cellar, MD [facility]

## 2020-07-06 DIAGNOSIS — M25561 Pain in right knee: Secondary | ICD-10-CM | POA: Diagnosis not present

## 2020-08-06 DIAGNOSIS — M25561 Pain in right knee: Secondary | ICD-10-CM | POA: Diagnosis not present

## 2020-08-14 DIAGNOSIS — E538 Deficiency of other specified B group vitamins: Secondary | ICD-10-CM | POA: Diagnosis not present

## 2020-08-14 DIAGNOSIS — M810 Age-related osteoporosis without current pathological fracture: Secondary | ICD-10-CM | POA: Diagnosis not present

## 2020-08-14 DIAGNOSIS — M859 Disorder of bone density and structure, unspecified: Secondary | ICD-10-CM | POA: Diagnosis not present

## 2020-08-14 DIAGNOSIS — E785 Hyperlipidemia, unspecified: Secondary | ICD-10-CM | POA: Diagnosis not present

## 2020-08-27 DIAGNOSIS — M5442 Lumbago with sciatica, left side: Secondary | ICD-10-CM | POA: Diagnosis not present

## 2020-08-27 DIAGNOSIS — M25561 Pain in right knee: Secondary | ICD-10-CM | POA: Diagnosis not present

## 2020-08-27 DIAGNOSIS — M5416 Radiculopathy, lumbar region: Secondary | ICD-10-CM | POA: Diagnosis not present

## 2020-09-19 DIAGNOSIS — M25552 Pain in left hip: Secondary | ICD-10-CM | POA: Diagnosis not present

## 2020-09-19 DIAGNOSIS — I10 Essential (primary) hypertension: Secondary | ICD-10-CM | POA: Diagnosis not present

## 2020-09-19 DIAGNOSIS — B373 Candidiasis of vulva and vagina: Secondary | ICD-10-CM | POA: Diagnosis not present

## 2020-09-19 DIAGNOSIS — M5416 Radiculopathy, lumbar region: Secondary | ICD-10-CM | POA: Diagnosis not present

## 2020-09-19 DIAGNOSIS — Z85038 Personal history of other malignant neoplasm of large intestine: Secondary | ICD-10-CM | POA: Diagnosis not present

## 2020-09-19 DIAGNOSIS — Z0000 Encounter for general adult medical examination without abnormal findings: Secondary | ICD-10-CM | POA: Diagnosis not present

## 2020-09-19 DIAGNOSIS — E538 Deficiency of other specified B group vitamins: Secondary | ICD-10-CM | POA: Diagnosis not present

## 2020-09-19 DIAGNOSIS — N390 Urinary tract infection, site not specified: Secondary | ICD-10-CM | POA: Diagnosis not present

## 2020-09-19 DIAGNOSIS — E785 Hyperlipidemia, unspecified: Secondary | ICD-10-CM | POA: Diagnosis not present

## 2020-09-19 DIAGNOSIS — M810 Age-related osteoporosis without current pathological fracture: Secondary | ICD-10-CM | POA: Diagnosis not present

## 2020-09-19 DIAGNOSIS — M25561 Pain in right knee: Secondary | ICD-10-CM | POA: Diagnosis not present

## 2020-09-19 DIAGNOSIS — M79676 Pain in unspecified toe(s): Secondary | ICD-10-CM | POA: Diagnosis not present

## 2020-09-19 DIAGNOSIS — R82998 Other abnormal findings in urine: Secondary | ICD-10-CM | POA: Diagnosis not present

## 2020-09-20 DIAGNOSIS — Z23 Encounter for immunization: Secondary | ICD-10-CM | POA: Diagnosis not present

## 2020-09-24 ENCOUNTER — Other Ambulatory Visit (HOSPITAL_COMMUNITY): Payer: Self-pay | Admitting: *Deleted

## 2020-09-25 ENCOUNTER — Other Ambulatory Visit: Payer: Self-pay

## 2020-09-25 ENCOUNTER — Ambulatory Visit (HOSPITAL_COMMUNITY)
Admission: RE | Admit: 2020-09-25 | Discharge: 2020-09-25 | Disposition: A | Discharge status: Home / Self Care | Payer: Medicare Other | Source: Ambulatory Visit | Attending: Internal Medicine | Admitting: Internal Medicine

## 2020-09-25 DIAGNOSIS — M810 Age-related osteoporosis without current pathological fracture: Secondary | ICD-10-CM | POA: Diagnosis not present

## 2020-09-25 MED ORDER — DENOSUMAB 60 MG/ML ~~LOC~~ SOSY
PREFILLED_SYRINGE | SUBCUTANEOUS | Status: AC
  Administered 2020-09-25: 60 mg via SUBCUTANEOUS
  Filled 2020-09-25: qty 1

## 2020-09-25 MED ORDER — DENOSUMAB 60 MG/ML ~~LOC~~ SOSY: 60.0000 mg | PREFILLED_SYRINGE | Freq: Once | SUBCUTANEOUS | Status: AC

## 2020-10-05 DIAGNOSIS — M25562 Pain in left knee: Secondary | ICD-10-CM | POA: Diagnosis not present

## 2020-10-09 DIAGNOSIS — M25562 Pain in left knee: Secondary | ICD-10-CM | POA: Diagnosis not present

## 2020-10-12 DIAGNOSIS — M25562 Pain in left knee: Secondary | ICD-10-CM | POA: Diagnosis not present

## 2020-10-24 DIAGNOSIS — M7122 Synovial cyst of popliteal space [Baker], left knee: Secondary | ICD-10-CM | POA: Diagnosis not present

## 2020-11-28 DIAGNOSIS — M7122 Synovial cyst of popliteal space [Baker], left knee: Secondary | ICD-10-CM | POA: Diagnosis not present

## 2020-12-05 ENCOUNTER — Other Ambulatory Visit: Payer: Self-pay | Admitting: Gastroenterology

## 2020-12-11 ENCOUNTER — Encounter (HOSPITAL_BASED_OUTPATIENT_CLINIC_OR_DEPARTMENT_OTHER): Payer: Self-pay | Admitting: Orthopedic Surgery

## 2020-12-13 ENCOUNTER — Other Ambulatory Visit: Payer: Self-pay

## 2020-12-13 ENCOUNTER — Encounter (HOSPITAL_BASED_OUTPATIENT_CLINIC_OR_DEPARTMENT_OTHER): Payer: Self-pay | Admitting: Orthopedic Surgery

## 2020-12-13 NOTE — Progress Notes (Signed)
Spoke w/ via phone for pre-op interview--- Pt Lab needs dos---- Istat and EKG              Lab results------ no COVID test -----patient states asymptomatic no test needed  Arrive at ------- 0530 on 12-18-2020 NPO after MN NO Solid Food.  Clear liquids from MN until--- 0430 Med rec completed Medications to take morning of surgery ----- NONE Diabetic medication ----- n/a Patient instructed no nail polish to be worn day of surgery Patient instructed to bring photo id and insurance card day of surgery  Patient aware to have Driver (ride ) / caregiver   for 24 hours after surgery -- one of her daughter's, either [place] or [place] Patient Special Instructions ----- n/a Pre-Op special Istructions ----- n/a Patient verbalized understanding of instructions that were given at this phone interview. Patient denies shortness of breath, chest pain, fever, cough at this phone interview.    Anesthesia Review:  HTN;  UC;  Primary biliary cirrhosis (per note clinically in remission ; hx stage 1 colon cancer s/p hemicolectomy 2014  PCP: Dr Joylene Draft GI:  Dr Oren Beckmann Cassell Clement 06-26-2020 epic) Chest x-ray : no EKG : 05-24-201 epic Echo : no Stress test: no Cardiac Cath : no Activity level: denies sob w/ any activity Blood Thinner/ Instructions /Last Dose: no ASA / Instructions/ Last Dose :  no

## 2020-12-17 NOTE — H&P (Signed)
PREOPERATIVE H&P  Chief Complaint: LEFT KNEE MEDIAL AND LATERAL MENISCUS TEARS  HPI: Heidi [name] is a 86 y.o. female who presents with a diagnosis of LEFT KNEE MEDIAL AND LATERAL MENISCUS TEARS. Symptoms are rated as moderate to severe, and have been worsening.  This is significantly impairing activities of daily living.  She has elected for surgical management.   Past Medical History:  Diagnosis Date   Acute meniscal tear of left knee    Arthritis    Baker cyst, left    Chronic lower back pain    Cirrhosis, primary biliary 2008   followed by dr Trinna Balloon---  dx by liver bx 2008,  per md note no evidence on images/ labs, clinicially in remission taking ursodiol   Diverticulosis of colon    GERD (gastroesophageal reflux disease)    Heart murmur    per pt was told yrs ago by pcp but no work up done, pt asymptomatic   Hiatal hernia    History of basal cell carcinoma (BCC) excision 2008   forehead   Hx of colon cancer, stage I 2014   09-15-2012  s/p laparoscopy partial colectomy,  no chemo/ radiation;  previously followed by oncologist, dr Benay Spice per released for lov note in epic 12-20-2015 with no recurrence   Hyperlipidemia, mixed    Hypertension    followed by pcp   Left knee pain    Macular degeneration of both eyes    Osteoporosis    Raynaud disease    hands    Schatzki's ring    hx dilatation 11/ 2017   Ulcerative colitis    followed by dr Havery Moros---  dx 2006,  left side (rectosigmoiditis without complications)   Venous insufficiency    lower legs   Wears hearing aid in both ears    Past Surgical History:  Procedure Laterality Date   CATARACT EXTRACTION W/ INTRAOCULAR LENS  IMPLANT, BILATERAL Bilateral 2008   COLONOSCOPY     last one 01-19-2019 by dr Havery Moros   ESOPHAGOGASTRODUODENOSCOPY (EGD) WITH ESOPHAGEAL DILATION  03/14/2016   KNEE ARTHROSCOPY Right 1990's   KNEE ARTHROSCOPY Left 08/23/2015   Procedure: ARTHROSCOPY KNEE PARTIAL MEDIAL AND LATERAL  MENISCECTOMY AND DEBRIDMENT;  Surgeon: Susa Day, MD;  Location: [facility];  Service: Orthopedics;  Laterality: Left;   LAPAROSCOPIC PARTIAL COLECTOMY N/A 09/15/2012   Procedure: LAPAROSCOPIC ILEOCOLECTOMY;  Surgeon: Stark Klein, MD;  Location: [place];  Service: General;  Laterality: N/A;   ORIF PATELLA Right 09/27/2019   Procedure: OPEN REDUCTION INTERNAL (ORIF) FIXATION PATELLA;  Surgeon: Renette Butters, MD;  Location: [facility];  Service: Orthopedics;  Laterality: Right;   ORIF WRIST FRACTURE Left 09/27/2019   Procedure: OPEN REDUCTION INTERNAL FIXATION (ORIF) WRIST FRACTURE;  Surgeon: Renette Butters, MD;  Location: [facility];  Service: Orthopedics;  Laterality: Left;   TONSILLECTOMY  1952   TUBAL LIGATION  1973   Social History   Socioeconomic History   Marital status: Widowed    Spouse name: Not on file   Number of children: 3   Years of education: Not on file   Highest education level: Not on file  Occupational History   Occupation: Compliance    Comment: insurance    Employer: [employer]  Tobacco Use   Smoking status: Former    Packs/day: 0.50    Years: 40.00    Pack years: 20.00    Types: Cigarettes    Quit date: 01/16/2012    Years since quitting: 8.9  Smokeless tobacco: Never  Vaping Use   Vaping Use: Never used  Substance and Sexual Activity   Alcohol use: Yes    Alcohol/week: 7.0 standard drinks    Types: 7 Glasses of wine per week    Comment: 12-13-2020  one wine daily   Drug use: No   Sexual activity: Not Currently  Other Topics Concern   Not on file  Social History Narrative   Widowed--lives alone   Drives and independent in [place]   [employer] industry   Social Determinants of Health   Financial Resource Strain: Not on file  Food Insecurity: Not on file  Transportation Needs: Not on file  Physical Activity: Not on file  Stress: Not on file  Social Connections: Not on file   Family History  Problem Relation Age of Onset    Heart disease Mother    Heart disease Brother    Stroke Father    Lung cancer Brother    Breast cancer Paternal Aunt    Ovarian cancer Paternal Grandmother    Colon cancer Neg Hx    Esophageal cancer Neg Hx    Stomach cancer Neg Hx    Pancreatic cancer Neg Hx    Rectal cancer Neg Hx    Allergies  Allergen Reactions   Codeine Nausea And Vomiting   Penicillins Hives   Prior to Admission medications   Medication Sig Start Date End Date Taking? Authorizing Provider  acetaminophen (TYLENOL) 650 MG CR tablet Take 650 mg by mouth 2 (two) times daily as needed for pain.   Yes [provider]  Chlorpheniramine Maleate (ALLERGY RELIEF PO) Take 1 tablet by mouth daily.   Yes [provider]  cholecalciferol (VITAMIN D3) 25 MCG (1000 UNIT) tablet Take 1,000 Units by mouth daily.   Yes [provider]  Cyanocobalamin (VITAMIN B 12) 500 MCG TABS Take 500 mcg by mouth daily. Patient taking differently: Take 1,000 mcg by mouth daily. 09/29/19  Yes Oretha Milch D, MD  ezetimibe (ZETIA) 10 MG tablet Take 5 mg by mouth 2 (two) times a week. Wednesday and Sunday.   Yes [provider]  fluticasone (FLONASE) 50 MCG/ACT nasal spray Place 1 spray into both nostrils daily as needed. 01/19/18  Yes [provider]  irbesartan (AVAPRO) 300 MG tablet Take 300 mg by mouth every evening. 09/14/19  Yes [provider]  mesalamine (LIALDA) 1.2 g EC tablet Take 2 tablets (2.4 g total) by mouth daily with breakfast. Patient taking differently: Take 2.4 g by mouth daily with breakfast. 06/26/20  Yes Armbruster, Carlota Raspberry, MD  Multiple Vitamins-Minerals (PRESERVISION AREDS PO) Take 1 tablet by mouth 2 (two) times daily.   Yes [provider]  omeprazole (PRILOSEC) 20 MG capsule TAKE ONE CAPSULE EACH DAY Patient taking differently: Take 20 mg by mouth daily. 06/02/14  Yes Inda Castle, MD  Pitavastatin Calcium 4 MG TABS Take 0.5 tablets by mouth 2 (two)  times a week. Wednesday and Sunday   Yes [provider]  ursodiol (ACTIGALL) 300 MG capsule TAKE 2 CAPSULES EVERY OTHER DAY AND TAKE3 CAPSULES EVERY OTHER DAY Patient taking differently: Take 300-900 mg by mouth as directed. TAKE 2 CAPSULES EVERY OTHER DAY AND TAKE3 CAPSULES EVERY OTHER DAY 12/05/20  Yes Armbruster, Carlota Raspberry, MD     Positive ROS: All other systems have been reviewed and were otherwise negative with the exception of those mentioned in the HPI and as above.  Physical Exam: General: Alert, no acute distress  Cardiovascular: No pedal edema Respiratory: No cyanosis, no use of accessory musculature GI: No organomegaly, abdomen is soft and non-tender Skin: No lesions in the area of chief complaint Neurologic: Sensation intact distally Psychiatric: Patient is competent for consent with normal mood and affect Lymphatic: No axillary or cervical lymphadenopathy  MUSCULOSKELETAL: mild swelling, + medial and lateral JLT, stable ligament exam, ROM 5-120  Assessment: LEFT KNEE MEDIAL AND LATERAL MENISCUS TEARS  Plan: Plan for Procedure(s):  LEFT KNEE ARTHROSCOPY WITH MEDIAL AND LATERAL MENISECTOMIES  The risks benefits and alternatives were discussed with the patient including but not limited to the risks of nonoperative treatment, versus surgical intervention including infection, bleeding, nerve injury,  blood clots, cardiopulmonary complications, morbidity, mortality, among others, and they were willing to proceed.   Weightbearing: WBAT LLE Orthopedic devices: none Showering: POD 3 Dressing: reinforce as needed Medicines: Norco, Mobic, Zofran  Discharge: home Follow up: 2 weeks   Alisa Graff Office [phone] 12/17/2020 11:28 AM

## 2020-12-18 ENCOUNTER — Ambulatory Visit (HOSPITAL_BASED_OUTPATIENT_CLINIC_OR_DEPARTMENT_OTHER): Payer: Medicare Other | Admitting: Anesthesiology

## 2020-12-18 ENCOUNTER — Ambulatory Visit (HOSPITAL_BASED_OUTPATIENT_CLINIC_OR_DEPARTMENT_OTHER)
Admission: RE | Admit: 2020-12-18 | Discharge: 2020-12-18 | Disposition: A | Discharge status: Home / Self Care | Payer: Medicare Other | Attending: Orthopedic Surgery | Admitting: Orthopedic Surgery

## 2020-12-18 ENCOUNTER — Encounter (HOSPITAL_BASED_OUTPATIENT_CLINIC_OR_DEPARTMENT_OTHER)
Admission: RE | Disposition: A | Discharge status: Home / Self Care | Payer: Self-pay | Source: Home / Self Care | Attending: Orthopedic Surgery

## 2020-12-18 ENCOUNTER — Encounter (HOSPITAL_BASED_OUTPATIENT_CLINIC_OR_DEPARTMENT_OTHER): Payer: Self-pay | Admitting: Orthopedic Surgery

## 2020-12-18 DIAGNOSIS — Z85828 Personal history of other malignant neoplasm of skin: Secondary | ICD-10-CM | POA: Insufficient documentation

## 2020-12-18 DIAGNOSIS — Z803 Family history of malignant neoplasm of breast: Secondary | ICD-10-CM | POA: Diagnosis not present

## 2020-12-18 DIAGNOSIS — Z8249 Family history of ischemic heart disease and other diseases of the circulatory system: Secondary | ICD-10-CM | POA: Diagnosis not present

## 2020-12-18 DIAGNOSIS — Z880 Allergy status to penicillin: Secondary | ICD-10-CM | POA: Diagnosis not present

## 2020-12-18 DIAGNOSIS — I872 Venous insufficiency (chronic) (peripheral): Secondary | ICD-10-CM | POA: Diagnosis not present

## 2020-12-18 DIAGNOSIS — X58XXXA Exposure to other specified factors, initial encounter: Secondary | ICD-10-CM | POA: Insufficient documentation

## 2020-12-18 DIAGNOSIS — K7460 Unspecified cirrhosis of liver: Secondary | ICD-10-CM | POA: Insufficient documentation

## 2020-12-18 DIAGNOSIS — Z801 Family history of malignant neoplasm of trachea, bronchus and lung: Secondary | ICD-10-CM | POA: Insufficient documentation

## 2020-12-18 DIAGNOSIS — Z87891 Personal history of nicotine dependence: Secondary | ICD-10-CM | POA: Insufficient documentation

## 2020-12-18 DIAGNOSIS — Z885 Allergy status to narcotic agent status: Secondary | ICD-10-CM | POA: Diagnosis not present

## 2020-12-18 DIAGNOSIS — S83282A Other tear of lateral meniscus, current injury, left knee, initial encounter: Secondary | ICD-10-CM | POA: Insufficient documentation

## 2020-12-18 DIAGNOSIS — E782 Mixed hyperlipidemia: Secondary | ICD-10-CM | POA: Diagnosis not present

## 2020-12-18 DIAGNOSIS — S83242A Other tear of medial meniscus, current injury, left knee, initial encounter: Secondary | ICD-10-CM | POA: Diagnosis not present

## 2020-12-18 DIAGNOSIS — I7300 Raynaud's syndrome without gangrene: Secondary | ICD-10-CM | POA: Insufficient documentation

## 2020-12-18 DIAGNOSIS — Z79899 Other long term (current) drug therapy: Secondary | ICD-10-CM | POA: Insufficient documentation

## 2020-12-18 DIAGNOSIS — Z9889 Other specified postprocedural states: Secondary | ICD-10-CM

## 2020-12-18 DIAGNOSIS — I10 Essential (primary) hypertension: Secondary | ICD-10-CM | POA: Diagnosis not present

## 2020-12-18 DIAGNOSIS — Z85038 Personal history of other malignant neoplasm of large intestine: Secondary | ICD-10-CM | POA: Diagnosis not present

## 2020-12-18 DIAGNOSIS — Z8041 Family history of malignant neoplasm of ovary: Secondary | ICD-10-CM | POA: Insufficient documentation

## 2020-12-18 HISTORY — DX: Esophageal obstruction: K22.2

## 2020-12-18 HISTORY — DX: Unspecified tear of unspecified meniscus, current injury, left knee, initial encounter: S83.207A

## 2020-12-18 HISTORY — DX: Pain in left knee: M25.562

## 2020-12-18 HISTORY — DX: Synovial cyst of popliteal space (Baker), left knee: M71.22

## 2020-12-18 HISTORY — DX: Diaphragmatic hernia without obstruction or gangrene: K44.9

## 2020-12-18 HISTORY — DX: Mixed hyperlipidemia: E78.2

## 2020-12-18 HISTORY — DX: Presence of external hearing-aid: Z97.4

## 2020-12-18 HISTORY — DX: Unspecified macular degeneration: H35.30

## 2020-12-18 HISTORY — PX: KNEE ARTHROSCOPY WITH MEDIAL MENISECTOMY: SHX5651

## 2020-12-18 LAB — POCT I-STAT, CHEM 8
BUN: 19 mg/dL (ref 8–23)
Calcium, Ion: 1.08 mmol/L — ABNORMAL LOW (ref 1.15–1.40)
Chloride: 111 mmol/L (ref 98–111)
Creatinine, Ser: 0.7 mg/dL (ref 0.44–1.00)
Glucose, Bld: 87 mg/dL (ref 70–99)
HCT: 42 % (ref 36.0–46.0)
Hemoglobin: 14.3 g/dL (ref 12.0–15.0)
Potassium: 4.3 mmol/L (ref 3.5–5.1)
Sodium: 140 mmol/L (ref 135–145)
TCO2: 23 mmol/L (ref 22–32)

## 2020-12-18 SURGERY — ARTHROSCOPY, KNEE, WITH MEDIAL MENISCECTOMY
Anesthesia: General | Site: Knee | Laterality: Left

## 2020-12-18 MED ORDER — ONDANSETRON HCL 4 MG/2ML IJ SOLN
INTRAMUSCULAR | Status: DC | PRN
  Administered 2020-12-18: 4 mg via INTRAVENOUS

## 2020-12-18 MED ORDER — LIDOCAINE HCL (CARDIAC) PF 100 MG/5ML IV SOSY
PREFILLED_SYRINGE | INTRAVENOUS | Status: DC | PRN
Start: 2020-12-18 — End: 2020-12-18
  Administered 2020-12-18: 40 mg via INTRAVENOUS

## 2020-12-18 MED ORDER — ACETAMINOPHEN 160 MG/5ML PO SOLN: 325.0000 mg | ORAL | Status: DC | PRN

## 2020-12-18 MED ORDER — LACTATED RINGERS IV SOLN: INTRAVENOUS | Status: DC

## 2020-12-18 MED ORDER — METHYLPREDNISOLONE ACETATE 80 MG/ML IJ SUSP
INTRAMUSCULAR | Status: DC | PRN
  Administered 2020-12-18: 80 mg

## 2020-12-18 MED ORDER — OXYCODONE HCL 5 MG PO TABS
ORAL_TABLET | ORAL | Status: AC
  Filled 2020-12-18: qty 1

## 2020-12-18 MED ORDER — SODIUM CHLORIDE 0.9 % IR SOLN
Status: DC | PRN
  Administered 2020-12-18 (×2): 3000 mL

## 2020-12-18 MED ORDER — FENTANYL CITRATE (PF) 100 MCG/2ML IJ SOLN
INTRAMUSCULAR | Status: DC | PRN
  Administered 2020-12-18: 50 ug via INTRAVENOUS

## 2020-12-18 MED ORDER — PROPOFOL 10 MG/ML IV BOLUS
INTRAVENOUS | Status: AC
  Filled 2020-12-18: qty 20

## 2020-12-18 MED ORDER — OXYCODONE HCL 5 MG PO TABS
5.0000 mg | ORAL_TABLET | Freq: Once | ORAL | Status: AC | PRN
  Administered 2020-12-18: 5 mg via ORAL

## 2020-12-18 MED ORDER — OXYCODONE HCL 5 MG/5ML PO SOLN: 5.0000 mg | Freq: Once | ORAL | Status: AC | PRN

## 2020-12-18 MED ORDER — DEXAMETHASONE SODIUM PHOSPHATE 10 MG/ML IJ SOLN
INTRAMUSCULAR | Status: AC
  Filled 2020-12-18: qty 1

## 2020-12-18 MED ORDER — PROPOFOL 10 MG/ML IV BOLUS
INTRAVENOUS | Status: DC | PRN
  Administered 2020-12-18: 100 mg via INTRAVENOUS

## 2020-12-18 MED ORDER — VANCOMYCIN HCL IN DEXTROSE 1-5 GM/200ML-% IV SOLN
1000.0000 mg | INTRAVENOUS | Status: AC
  Administered 2020-12-18: 1000 mg via INTRAVENOUS

## 2020-12-18 MED ORDER — MELOXICAM 15 MG PO TABS: 15.0000 mg | ORAL_TABLET | Freq: Every day | ORAL | 0 refills | Status: DC

## 2020-12-18 MED ORDER — FENTANYL CITRATE (PF) 100 MCG/2ML IJ SOLN: 25.0000 ug | INTRAMUSCULAR | Status: DC | PRN

## 2020-12-18 MED ORDER — ACETAMINOPHEN 325 MG PO TABS: 325.0000 mg | ORAL_TABLET | ORAL | Status: DC | PRN

## 2020-12-18 MED ORDER — ONDANSETRON HCL 4 MG/2ML IJ SOLN
INTRAMUSCULAR | Status: AC
  Filled 2020-12-18: qty 2

## 2020-12-18 MED ORDER — LIDOCAINE HCL (PF) 2 % IJ SOLN
INTRAMUSCULAR | Status: AC
  Filled 2020-12-18: qty 5

## 2020-12-18 MED ORDER — BUPIVACAINE HCL 0.5 % IJ SOLN
INTRAMUSCULAR | Status: DC | PRN
  Administered 2020-12-18: 4 mL

## 2020-12-18 MED ORDER — VANCOMYCIN HCL IN DEXTROSE 1-5 GM/200ML-% IV SOLN
INTRAVENOUS | Status: AC
  Filled 2020-12-18: qty 200

## 2020-12-18 MED ORDER — DEXAMETHASONE SODIUM PHOSPHATE 4 MG/ML IJ SOLN
INTRAMUSCULAR | Status: DC | PRN
  Administered 2020-12-18: 10 mg via INTRAVENOUS

## 2020-12-18 MED ORDER — ACETAMINOPHEN 10 MG/ML IV SOLN: 1000.0000 mg | Freq: Once | INTRAVENOUS | Status: DC | PRN

## 2020-12-18 MED ORDER — DEXAMETHASONE SODIUM PHOSPHATE 10 MG/ML IJ SOLN: 8.0000 mg | Freq: Once | INTRAMUSCULAR | Status: DC

## 2020-12-18 MED ORDER — POVIDONE-IODINE 10 % EX SWAB: 2.0000 "application " | Freq: Once | CUTANEOUS | Status: DC

## 2020-12-18 MED ORDER — FENTANYL CITRATE (PF) 100 MCG/2ML IJ SOLN
INTRAMUSCULAR | Status: AC
  Filled 2020-12-18: qty 2

## 2020-12-18 MED ORDER — ONDANSETRON 4 MG PO TBDP: 4.0000 mg | ORAL_TABLET | Freq: Two times a day (BID) | ORAL | 0 refills | Status: DC | PRN

## 2020-12-18 MED ORDER — ACETAMINOPHEN 500 MG PO TABS
1000.0000 mg | ORAL_TABLET | Freq: Once | ORAL | Status: AC
  Administered 2020-12-18: 1000 mg via ORAL

## 2020-12-18 MED ORDER — HYDROCODONE-ACETAMINOPHEN 5-325 MG PO TABS: 1.0000 | ORAL_TABLET | Freq: Four times a day (QID) | ORAL | 0 refills | Status: DC | PRN

## 2020-12-18 MED ORDER — ACETAMINOPHEN 500 MG PO TABS
ORAL_TABLET | ORAL | Status: AC
  Filled 2020-12-18: qty 2

## 2020-12-18 MED ORDER — AMISULPRIDE (ANTIEMETIC) 5 MG/2ML IV SOLN: 10.0000 mg | Freq: Once | INTRAVENOUS | Status: DC | PRN

## 2020-12-18 SURGICAL SUPPLY — 31 items
APL PRP STRL LF DISP 70% ISPRP (MISCELLANEOUS) ×1
BNDG ELASTIC 6X5.8 VLCR STR LF (GAUZE/BANDAGES/DRESSINGS) ×2 IMPLANT
CHLORAPREP W/TINT 26 (MISCELLANEOUS) ×2 IMPLANT
DISSECTOR  3.8MM X 13CM (MISCELLANEOUS) ×2
DISSECTOR 3.8MM X 13CM (MISCELLANEOUS) ×1 IMPLANT
DRAPE ARTHROSCOPY W/POUCH 114 (DRAPES) ×2 IMPLANT
DRAPE IMP U-DRAPE 54X76 (DRAPES) ×2 IMPLANT
DRAPE U-SHAPE 47X51 STRL (DRAPES) ×2 IMPLANT
DRSG EMULSION OIL 3X3 NADH (GAUZE/BANDAGES/DRESSINGS) ×2 IMPLANT
DRSG PAD ABDOMINAL 8X10 ST (GAUZE/BANDAGES/DRESSINGS) ×2 IMPLANT
EXCALIBUR 3.8MM X 13CM (MISCELLANEOUS) IMPLANT
GAUZE 4X4 16PLY ~~LOC~~+RFID DBL (SPONGE) ×2 IMPLANT
GAUZE SPONGE 4X4 12PLY STRL (GAUZE/BANDAGES/DRESSINGS) ×2 IMPLANT
GLOVE SRG 8 PF TXTR STRL LF DI (GLOVE) ×2 IMPLANT
GLOVE SURG ENC MOIS LTX SZ7.5 (GLOVE) ×4 IMPLANT
GLOVE SURG UNDER POLY LF SZ6.5 (GLOVE) ×2 IMPLANT
GLOVE SURG UNDER POLY LF SZ7 (GLOVE) ×2 IMPLANT
GLOVE SURG UNDER POLY LF SZ8 (GLOVE) ×4
GOWN STRL REUS W/TWL LRG LVL3 (GOWN DISPOSABLE) ×4 IMPLANT
IV NS IRRIG 3000ML ARTHROMATIC (IV SOLUTION) ×4 IMPLANT
KIT TURNOVER CYSTO (KITS) ×2 IMPLANT
MANIFOLD NEPTUNE II (INSTRUMENTS) ×2 IMPLANT
PACK ARTHROSCOPY DSU (CUSTOM PROCEDURE TRAY) ×2 IMPLANT
PACK BASIN DAY SURGERY FS (CUSTOM PROCEDURE TRAY) ×2 IMPLANT
PAD CAST 4YDX4 CTTN HI CHSV (CAST SUPPLIES) ×1 IMPLANT
PADDING CAST COTTON 4X4 STRL (CAST SUPPLIES) ×2
SUT ETHILON 3 0 PS 1 (SUTURE) ×2 IMPLANT
TAPE CLOTH 3X10 TAN LF (GAUZE/BANDAGES/DRESSINGS) IMPLANT
TOWEL OR 17X26 10 PK STRL BLUE (TOWEL DISPOSABLE) ×2 IMPLANT
TUBING ARTHROSCOPY IRRIG 16FT (MISCELLANEOUS) ×2 IMPLANT
WRAP KNEE MAXI GEL POST OP (GAUZE/BANDAGES/DRESSINGS) ×2 IMPLANT

## 2020-12-18 NOTE — Anesthesia Postprocedure Evaluation (Signed)
Anesthesia Post Note  Patient: Heidi [name]  Procedure(s) Performed: KNEE ARTHROSCOPY WITH MEDIAL AND LATERAL MENISECTOMIES (Left: Knee)     Patient location during evaluation: PACU Anesthesia Type: General Level of consciousness: awake and alert Pain management: pain level controlled Vital Signs Assessment: post-procedure vital signs reviewed and stable Respiratory status: spontaneous breathing, nonlabored ventilation, respiratory function stable and patient connected to nasal cannula oxygen Cardiovascular status: blood pressure returned to baseline and stable Postop Assessment: no apparent nausea or vomiting Anesthetic complications: no   No notable events documented.  Last Vitals:  Vitals:   12/18/20 0830 12/18/20 0920  BP: (!) 152/77 (!) 150/65  Pulse: 61 65  Resp:    Temp:  36.4 C  SpO2: 97% 93%    Last Pain:  Vitals:   12/18/20 0920  TempSrc:   PainSc: 3                  Effie Berkshire

## 2020-12-18 NOTE — Anesthesia Preprocedure Evaluation (Addendum)
Anesthesia Evaluation  Patient identified by MRN, date of birth, ID band Patient awake    Reviewed: Allergy & Precautions, NPO status , Patient's Chart, lab work & pertinent test results  Airway Mallampati: II  TM Distance: >3 FB Neck ROM: Full    Dental  (+) Teeth Intact, Dental Advisory Given   Pulmonary former smoker,    breath sounds clear to auscultation       Cardiovascular hypertension, Pt. on medications  Rhythm:Regular Rate:Normal     Neuro/Psych negative neurological ROS  negative psych ROS   GI/Hepatic hiatal hernia, PUD, GERD  Medicated,(+) Hepatitis -  Endo/Other  negative endocrine ROS  Renal/GU negative Renal ROS     Musculoskeletal  (+) Arthritis ,   Abdominal Normal abdominal exam  (+)   Peds  Hematology negative hematology ROS (+)   Anesthesia Other Findings   Reproductive/Obstetrics                            Anesthesia Physical Anesthesia Plan  ASA: 3  Anesthesia Plan: General   Post-op Pain Management:    Induction: Intravenous  PONV Risk Score and Plan: 4 or greater and Ondansetron, Dexamethasone and Treatment may vary due to age or medical condition  Airway Management Planned: LMA  Additional Equipment: None  Intra-op Plan:   Post-operative Plan: Extubation in OR  Informed Consent: I have reviewed the patients History and Physical, chart, labs and discussed the procedure including the risks, benefits and alternatives for the proposed anesthesia with the patient or authorized representative who has indicated his/her understanding and acceptance.     Dental advisory given  Plan Discussed with: CRNA  Anesthesia Plan Comments:        Anesthesia Quick Evaluation

## 2020-12-18 NOTE — Anesthesia Procedure Notes (Signed)
Procedure Name: LMA Insertion Date/Time: 12/18/2020 7:18 AM Performed by: Riki Sheer, CRNA Pre-anesthesia Checklist: Patient identified, Emergency Drugs available, Suction available, Patient being monitored and Timeout performed Patient Re-evaluated:Patient Re-evaluated prior to induction Oxygen Delivery Method: Circle system utilized Preoxygenation: Pre-oxygenation with 100% oxygen Induction Type: IV induction Ventilation: Mask ventilation without difficulty LMA: LMA inserted LMA Size: 4.0 Number of attempts: 1 Placement Confirmation: positive ETCO2, CO2 detector and breath sounds checked- equal and bilateral Tube secured with: Tape Dental Injury: Teeth and Oropharynx as per pre-operative assessment

## 2020-12-18 NOTE — Transfer of Care (Signed)
Immediate Anesthesia Transfer of Care Note  Patient: Heidi [name]  Procedure(s) Performed: KNEE ARTHROSCOPY WITH MEDIAL AND LATERAL MENISECTOMIES (Left: Knee)  Patient Location: PACU  Anesthesia Type:General  Level of Consciousness: awake and patient cooperative  Airway & Oxygen Therapy: Patient Spontanous Breathing and Patient connected to nasal cannula oxygen  Post-op Assessment: Report given to RN and Post -op Vital signs reviewed and stable  Post vital signs: Reviewed and stable  Last Vitals:  Vitals Value Taken Time  BP    Temp    Pulse    Resp    SpO2      Last Pain:  Vitals:   12/18/20 0605  TempSrc: Oral  PainSc: 0-No pain      Patients Stated Pain Goal: 4 (94/12/90 4753)  Complications: No notable events documented.

## 2020-12-18 NOTE — Discharge Instructions (Addendum)
POST-OPERATIVE OPIOID TAPER INSTRUCTIONS: It is important to wean off of your opioid medication as soon as possible. If you do not need pain medication after your surgery it is ok to stop day one. Opioids include: Codeine, Hydrocodone(Norco, Vicodin), Oxycodone(Percocet, oxycontin) and hydromorphone amongst others.  Long term and even short term use of opiods can cause: Increased pain response Dependence Constipation Depression Respiratory depression And more.  Withdrawal symptoms can include Flu like symptoms Nausea, vomiting And more Techniques to manage these symptoms Hydrate well Eat regular healthy meals Stay active Use relaxation techniques(deep breathing, meditating, yoga) Do Not substitute Alcohol to help with tapering If you have been on opioids for less than two weeks and do not have pain than it is ok to stop all together.  Plan to wean off of opioids This plan should start within one week post op of your joint replacement. Maintain the same interval or time between taking each dose and first decrease the dose.  Cut the total daily intake of opioids by one tablet each day Next start to increase the time between doses. The last dose that should be eliminated is the evening dose.     Post Anesthesia Home Care Instructions  Activity: Get plenty of rest for the remainder of the day. A responsible individual must stay with you for 24 hours following the procedure.  For the next 24 hours, DO NOT: -Drive a car -Paediatric nurse -Drink alcoholic beverages -Take any medication unless instructed by your physician -Make any legal decisions or sign important papers.  Meals: Start with liquid foods such as gelatin or soup. Progress to regular foods as tolerated. Avoid greasy, spicy, heavy foods. If nausea and/or vomiting occur, drink only clear liquids until the nausea and/or vomiting subsides. Call your physician if vomiting continues.  Special Instructions/Symptoms: Your  throat may feel dry or sore from the anesthesia or the breathing tube placed in your throat during surgery. If this causes discomfort, gargle with warm salt water. The discomfort should disappear within 24 hours.

## 2020-12-18 NOTE — Op Note (Signed)
12/18/2020  7:48 AM  PATIENT:  Heidi [name]    PRE-OPERATIVE DIAGNOSIS:  LEFT KNEE MEDIAL AND LATERAL MENISCUS TEARS  POST-OPERATIVE DIAGNOSIS:  Same  PROCEDURE:  KNEE ARTHROSCOPY WITH MEDIAL AND LATERAL MENISECTOMIES  SURGEON:  Renette Butters, MD  ASSISTANT: Aggie Moats, PA-C, he was present and scrubbed throughout the case, critical for completion in a timely fashion, and for retraction, instrumentation, and closure.   ANESTHESIA:   General  BLOOD LOSS: min  COMPLICATIONS: None   PREOPERATIVE INDICATIONS:  Heidi [name] is a  86 y.o. female with a diagnosis of LEFT KNEE MEDIAL AND LATERAL MENISCUS TEARS who failed conservative measures and elected for surgical management.    The risks benefits and alternatives were discussed with the patient preoperatively including but not limited to the risks of infection, bleeding, nerve injury, cardiopulmonary complications, the need for revision surgery, among others, and the patient was willing to proceed.  OPERATIVE IMPLANTS: none  OPERATIVE FINDINGS: Examination under anesthesia: stable Diagnostic Arthroscopy:  articular cartilage:grad 3/4 lat, grae 2 PF Medial meniscus:radial tear Lateral meniscus:radial tear Anterior cruciate ligament/PCL: atable  Loose bodies: none    OPERATIVE PROCEDURE:  Patient was identified in the preoperative holding area and site was marked by me female was transported to the operating theater and placed on the table in supine position taking care to pad all bony prominences. After a preincinduction time out anesthesia was induced.  female received vanc for preoperative antibiotics. The left lower extremity was prepped and draped in normal sterile fashion and a pre-incision timeout was performed.   A small stab incision was made in the anterolateral portal position. The arthroscope was introduced in the joint. A medial portal was then established under direct visualization just above the  anterior horn of the medial meniscus. Diagnostic arthroscopy was then carried out with findings as described above.  I debrided the med and lat meniscus to stable rmaining meniscus.  I performed a chondroplasty of the post patella  The arthroscopic equipment was removed from the joint and the portals were closed with 3-0 nylon in an interrupted fashion. The knee was infiltrated with marcaine.  Sterile dressings were then applied including Xeroform 4 x 4's ABDs an ACE bandage.  The patient was then allowed to awaken from general anesthesia, transferred to the stretcher and taken to the recovery room in stable condition.  POSTOPERATIVE PLAN: The patient will be discharged home today and will followup in one week for suture removal and wound check.  VTE prophylaxis: ASA and ambulate.

## 2020-12-18 NOTE — Interval H&P Note (Signed)
History and Physical Interval Note:  12/18/2020 7:03 AM  Heidi [name]  has presented today for surgery, with the diagnosis of LEFT KNEE MEDIAL AND LATERAL MENISCUS TEARS.  The various methods of treatment have been discussed with the patient and family. After consideration of risks, benefits and other options for treatment, the patient has consented to  Procedure(s): KNEE ARTHROSCOPY WITH MEDIAL AND LATERAL MENISECTOMIES (Left) as a surgical intervention.  The patient's history has been reviewed, patient examined, no change in status, stable for surgery.  I have reviewed the patient's chart and labs.  Questions were answered to the patient's satisfaction.     Renette Butters

## 2020-12-19 ENCOUNTER — Encounter (HOSPITAL_BASED_OUTPATIENT_CLINIC_OR_DEPARTMENT_OTHER): Payer: Self-pay | Admitting: Orthopedic Surgery

## 2020-12-28 DIAGNOSIS — S83282D Other tear of lateral meniscus, current injury, left knee, subsequent encounter: Secondary | ICD-10-CM | POA: Diagnosis not present

## 2020-12-28 DIAGNOSIS — S83242D Other tear of medial meniscus, current injury, left knee, subsequent encounter: Secondary | ICD-10-CM | POA: Diagnosis not present

## 2020-12-30 LAB — POCT I-STAT, CHEM 8
BUN: 25 mg/dL — ABNORMAL HIGH (ref 8–23)
Calcium, Ion: 1.16 mmol/L (ref 1.15–1.40)
Chloride: 109 mmol/L (ref 98–111)
Creatinine, Ser: 0.6 mg/dL (ref 0.44–1.00)
Glucose, Bld: 93 mg/dL (ref 70–99)
HCT: 42 % (ref 36.0–46.0)
Hemoglobin: 14.3 g/dL (ref 12.0–15.0)
Potassium: 7.3 mmol/L (ref 3.5–5.1)
Sodium: 138 mmol/L (ref 135–145)
TCO2: 25 mmol/L (ref 22–32)

## 2021-01-02 DIAGNOSIS — M25562 Pain in left knee: Secondary | ICD-10-CM | POA: Diagnosis not present

## 2021-01-02 DIAGNOSIS — M6281 Muscle weakness (generalized): Secondary | ICD-10-CM | POA: Diagnosis not present

## 2021-01-02 DIAGNOSIS — R262 Difficulty in walking, not elsewhere classified: Secondary | ICD-10-CM | POA: Diagnosis not present

## 2021-01-02 DIAGNOSIS — M25662 Stiffness of left knee, not elsewhere classified: Secondary | ICD-10-CM | POA: Diagnosis not present

## 2021-01-04 DIAGNOSIS — M6281 Muscle weakness (generalized): Secondary | ICD-10-CM | POA: Diagnosis not present

## 2021-01-04 DIAGNOSIS — M25562 Pain in left knee: Secondary | ICD-10-CM | POA: Diagnosis not present

## 2021-01-04 DIAGNOSIS — M25662 Stiffness of left knee, not elsewhere classified: Secondary | ICD-10-CM | POA: Diagnosis not present

## 2021-01-04 DIAGNOSIS — R262 Difficulty in walking, not elsewhere classified: Secondary | ICD-10-CM | POA: Diagnosis not present

## 2021-01-09 DIAGNOSIS — M25662 Stiffness of left knee, not elsewhere classified: Secondary | ICD-10-CM | POA: Diagnosis not present

## 2021-01-09 DIAGNOSIS — M6281 Muscle weakness (generalized): Secondary | ICD-10-CM | POA: Diagnosis not present

## 2021-01-09 DIAGNOSIS — R262 Difficulty in walking, not elsewhere classified: Secondary | ICD-10-CM | POA: Diagnosis not present

## 2021-01-09 DIAGNOSIS — M25562 Pain in left knee: Secondary | ICD-10-CM | POA: Diagnosis not present

## 2021-01-14 DIAGNOSIS — M6281 Muscle weakness (generalized): Secondary | ICD-10-CM | POA: Diagnosis not present

## 2021-01-14 DIAGNOSIS — M25562 Pain in left knee: Secondary | ICD-10-CM | POA: Diagnosis not present

## 2021-01-14 DIAGNOSIS — R262 Difficulty in walking, not elsewhere classified: Secondary | ICD-10-CM | POA: Diagnosis not present

## 2021-01-14 DIAGNOSIS — M25662 Stiffness of left knee, not elsewhere classified: Secondary | ICD-10-CM | POA: Diagnosis not present

## 2021-01-17 DIAGNOSIS — M6281 Muscle weakness (generalized): Secondary | ICD-10-CM | POA: Diagnosis not present

## 2021-01-17 DIAGNOSIS — R262 Difficulty in walking, not elsewhere classified: Secondary | ICD-10-CM | POA: Diagnosis not present

## 2021-01-17 DIAGNOSIS — M25662 Stiffness of left knee, not elsewhere classified: Secondary | ICD-10-CM | POA: Diagnosis not present

## 2021-01-17 DIAGNOSIS — M25562 Pain in left knee: Secondary | ICD-10-CM | POA: Diagnosis not present

## 2021-01-21 DIAGNOSIS — M25662 Stiffness of left knee, not elsewhere classified: Secondary | ICD-10-CM | POA: Diagnosis not present

## 2021-01-21 DIAGNOSIS — M6281 Muscle weakness (generalized): Secondary | ICD-10-CM | POA: Diagnosis not present

## 2021-01-21 DIAGNOSIS — M25562 Pain in left knee: Secondary | ICD-10-CM | POA: Diagnosis not present

## 2021-01-21 DIAGNOSIS — R262 Difficulty in walking, not elsewhere classified: Secondary | ICD-10-CM | POA: Diagnosis not present

## 2021-01-25 DIAGNOSIS — R262 Difficulty in walking, not elsewhere classified: Secondary | ICD-10-CM | POA: Diagnosis not present

## 2021-01-25 DIAGNOSIS — M25562 Pain in left knee: Secondary | ICD-10-CM | POA: Diagnosis not present

## 2021-01-25 DIAGNOSIS — M6281 Muscle weakness (generalized): Secondary | ICD-10-CM | POA: Diagnosis not present

## 2021-01-25 DIAGNOSIS — M25662 Stiffness of left knee, not elsewhere classified: Secondary | ICD-10-CM | POA: Diagnosis not present

## 2021-01-28 DIAGNOSIS — M545 Low back pain, unspecified: Secondary | ICD-10-CM | POA: Diagnosis not present

## 2021-02-01 DIAGNOSIS — M25562 Pain in left knee: Secondary | ICD-10-CM | POA: Diagnosis not present

## 2021-02-01 DIAGNOSIS — M48061 Spinal stenosis, lumbar region without neurogenic claudication: Secondary | ICD-10-CM | POA: Diagnosis not present

## 2021-02-01 DIAGNOSIS — R2689 Other abnormalities of gait and mobility: Secondary | ICD-10-CM | POA: Diagnosis not present

## 2021-02-01 DIAGNOSIS — R531 Weakness: Secondary | ICD-10-CM | POA: Diagnosis not present

## 2021-02-04 DIAGNOSIS — M6281 Muscle weakness (generalized): Secondary | ICD-10-CM | POA: Diagnosis not present

## 2021-02-04 DIAGNOSIS — M25662 Stiffness of left knee, not elsewhere classified: Secondary | ICD-10-CM | POA: Diagnosis not present

## 2021-02-04 DIAGNOSIS — M25562 Pain in left knee: Secondary | ICD-10-CM | POA: Diagnosis not present

## 2021-02-04 DIAGNOSIS — R262 Difficulty in walking, not elsewhere classified: Secondary | ICD-10-CM | POA: Diagnosis not present

## 2021-02-08 DIAGNOSIS — M25562 Pain in left knee: Secondary | ICD-10-CM | POA: Diagnosis not present

## 2021-02-08 DIAGNOSIS — M48061 Spinal stenosis, lumbar region without neurogenic claudication: Secondary | ICD-10-CM | POA: Diagnosis not present

## 2021-02-08 DIAGNOSIS — R531 Weakness: Secondary | ICD-10-CM | POA: Diagnosis not present

## 2021-02-08 DIAGNOSIS — R2689 Other abnormalities of gait and mobility: Secondary | ICD-10-CM | POA: Diagnosis not present

## 2021-02-11 DIAGNOSIS — Z23 Encounter for immunization: Secondary | ICD-10-CM | POA: Diagnosis not present

## 2021-02-13 DIAGNOSIS — R262 Difficulty in walking, not elsewhere classified: Secondary | ICD-10-CM | POA: Diagnosis not present

## 2021-02-13 DIAGNOSIS — M25662 Stiffness of left knee, not elsewhere classified: Secondary | ICD-10-CM | POA: Diagnosis not present

## 2021-02-13 DIAGNOSIS — M6281 Muscle weakness (generalized): Secondary | ICD-10-CM | POA: Diagnosis not present

## 2021-02-13 DIAGNOSIS — M25562 Pain in left knee: Secondary | ICD-10-CM | POA: Diagnosis not present

## 2021-02-15 DIAGNOSIS — R2689 Other abnormalities of gait and mobility: Secondary | ICD-10-CM | POA: Diagnosis not present

## 2021-02-15 DIAGNOSIS — M48061 Spinal stenosis, lumbar region without neurogenic claudication: Secondary | ICD-10-CM | POA: Diagnosis not present

## 2021-02-15 DIAGNOSIS — R531 Weakness: Secondary | ICD-10-CM | POA: Diagnosis not present

## 2021-02-15 DIAGNOSIS — M25562 Pain in left knee: Secondary | ICD-10-CM | POA: Diagnosis not present

## 2021-02-20 DIAGNOSIS — M6281 Muscle weakness (generalized): Secondary | ICD-10-CM | POA: Diagnosis not present

## 2021-02-20 DIAGNOSIS — M25562 Pain in left knee: Secondary | ICD-10-CM | POA: Diagnosis not present

## 2021-02-20 DIAGNOSIS — R262 Difficulty in walking, not elsewhere classified: Secondary | ICD-10-CM | POA: Diagnosis not present

## 2021-02-20 DIAGNOSIS — M25662 Stiffness of left knee, not elsewhere classified: Secondary | ICD-10-CM | POA: Diagnosis not present

## 2021-02-22 DIAGNOSIS — R531 Weakness: Secondary | ICD-10-CM | POA: Diagnosis not present

## 2021-02-22 DIAGNOSIS — M25562 Pain in left knee: Secondary | ICD-10-CM | POA: Diagnosis not present

## 2021-02-22 DIAGNOSIS — R2689 Other abnormalities of gait and mobility: Secondary | ICD-10-CM | POA: Diagnosis not present

## 2021-02-22 DIAGNOSIS — M48061 Spinal stenosis, lumbar region without neurogenic claudication: Secondary | ICD-10-CM | POA: Diagnosis not present

## 2021-02-25 DIAGNOSIS — M48061 Spinal stenosis, lumbar region without neurogenic claudication: Secondary | ICD-10-CM | POA: Diagnosis not present

## 2021-02-25 DIAGNOSIS — R531 Weakness: Secondary | ICD-10-CM | POA: Diagnosis not present

## 2021-02-25 DIAGNOSIS — R2689 Other abnormalities of gait and mobility: Secondary | ICD-10-CM | POA: Diagnosis not present

## 2021-02-25 DIAGNOSIS — M25562 Pain in left knee: Secondary | ICD-10-CM | POA: Diagnosis not present

## 2021-03-01 DIAGNOSIS — R531 Weakness: Secondary | ICD-10-CM | POA: Diagnosis not present

## 2021-03-01 DIAGNOSIS — R2689 Other abnormalities of gait and mobility: Secondary | ICD-10-CM | POA: Diagnosis not present

## 2021-03-01 DIAGNOSIS — M25562 Pain in left knee: Secondary | ICD-10-CM | POA: Diagnosis not present

## 2021-03-01 DIAGNOSIS — M48061 Spinal stenosis, lumbar region without neurogenic claudication: Secondary | ICD-10-CM | POA: Diagnosis not present

## 2021-03-04 DIAGNOSIS — Z23 Encounter for immunization: Secondary | ICD-10-CM | POA: Diagnosis not present

## 2021-03-08 DIAGNOSIS — M48061 Spinal stenosis, lumbar region without neurogenic claudication: Secondary | ICD-10-CM | POA: Diagnosis not present

## 2021-03-08 DIAGNOSIS — R2689 Other abnormalities of gait and mobility: Secondary | ICD-10-CM | POA: Diagnosis not present

## 2021-03-08 DIAGNOSIS — M25562 Pain in left knee: Secondary | ICD-10-CM | POA: Diagnosis not present

## 2021-03-08 DIAGNOSIS — R531 Weakness: Secondary | ICD-10-CM | POA: Diagnosis not present

## 2021-03-11 DIAGNOSIS — M25562 Pain in left knee: Secondary | ICD-10-CM | POA: Diagnosis not present

## 2021-03-25 DIAGNOSIS — Z85038 Personal history of other malignant neoplasm of large intestine: Secondary | ICD-10-CM | POA: Diagnosis not present

## 2021-03-25 DIAGNOSIS — J309 Allergic rhinitis, unspecified: Secondary | ICD-10-CM | POA: Diagnosis not present

## 2021-03-25 DIAGNOSIS — E785 Hyperlipidemia, unspecified: Secondary | ICD-10-CM | POA: Diagnosis not present

## 2021-03-25 DIAGNOSIS — M810 Age-related osteoporosis without current pathological fracture: Secondary | ICD-10-CM | POA: Diagnosis not present

## 2021-03-25 DIAGNOSIS — M5442 Lumbago with sciatica, left side: Secondary | ICD-10-CM | POA: Diagnosis not present

## 2021-03-25 DIAGNOSIS — M1990 Unspecified osteoarthritis, unspecified site: Secondary | ICD-10-CM | POA: Diagnosis not present

## 2021-03-25 DIAGNOSIS — I10 Essential (primary) hypertension: Secondary | ICD-10-CM | POA: Diagnosis not present

## 2021-03-25 DIAGNOSIS — K743 Primary biliary cirrhosis: Secondary | ICD-10-CM | POA: Diagnosis not present

## 2021-03-25 DIAGNOSIS — K219 Gastro-esophageal reflux disease without esophagitis: Secondary | ICD-10-CM | POA: Diagnosis not present

## 2021-04-03 ENCOUNTER — Encounter (INDEPENDENT_AMBULATORY_CARE_PROVIDER_SITE_OTHER): Payer: Medicare Other | Admitting: Ophthalmology

## 2021-04-03 ENCOUNTER — Other Ambulatory Visit: Payer: Self-pay

## 2021-04-03 DIAGNOSIS — H35033 Hypertensive retinopathy, bilateral: Secondary | ICD-10-CM

## 2021-04-03 DIAGNOSIS — H353132 Nonexudative age-related macular degeneration, bilateral, intermediate dry stage: Secondary | ICD-10-CM

## 2021-04-03 DIAGNOSIS — I10 Essential (primary) hypertension: Secondary | ICD-10-CM | POA: Diagnosis not present

## 2021-04-03 DIAGNOSIS — H43813 Vitreous degeneration, bilateral: Secondary | ICD-10-CM

## 2021-04-08 ENCOUNTER — Other Ambulatory Visit (HOSPITAL_COMMUNITY): Payer: Self-pay | Admitting: *Deleted

## 2021-04-09 ENCOUNTER — Other Ambulatory Visit: Payer: Self-pay

## 2021-04-09 ENCOUNTER — Ambulatory Visit (HOSPITAL_COMMUNITY)
Admission: RE | Admit: 2021-04-09 | Discharge: 2021-04-09 | Disposition: A | Discharge status: Home / Self Care | Payer: Medicare Other | Source: Ambulatory Visit | Attending: Internal Medicine | Admitting: Internal Medicine

## 2021-04-09 DIAGNOSIS — M810 Age-related osteoporosis without current pathological fracture: Secondary | ICD-10-CM | POA: Diagnosis not present

## 2021-04-09 DIAGNOSIS — Z1231 Encounter for screening mammogram for malignant neoplasm of breast: Secondary | ICD-10-CM | POA: Diagnosis not present

## 2021-04-09 MED ORDER — DENOSUMAB 60 MG/ML ~~LOC~~ SOSY
PREFILLED_SYRINGE | SUBCUTANEOUS | Status: AC
  Administered 2021-04-09: 60 mg via SUBCUTANEOUS
  Filled 2021-04-09: qty 1

## 2021-04-09 MED ORDER — DENOSUMAB 60 MG/ML ~~LOC~~ SOSY: 60.0000 mg | PREFILLED_SYRINGE | Freq: Once | SUBCUTANEOUS | Status: AC

## 2021-06-03 ENCOUNTER — Telehealth: Payer: Self-pay | Admitting: Gastroenterology

## 2021-06-03 NOTE — Telephone Encounter (Signed)
Inbound call from [facility] states Mesalamine 1.2g have been approved 06/03/21 - 06/03/22

## 2021-06-03 NOTE — Telephone Encounter (Signed)
Inbound call from patient states [employer] is faxing a form for medication approval

## 2021-06-10 DIAGNOSIS — M25562 Pain in left knee: Secondary | ICD-10-CM | POA: Diagnosis not present

## 2021-06-24 ENCOUNTER — Other Ambulatory Visit: Payer: Self-pay | Admitting: Gastroenterology

## 2021-06-28 NOTE — Progress Notes (Signed)
Cardiology Office Note   Date:  07/04/2021   ID:  Heidi, [name] 1935/01/26, MRN [id number]  PCP:  Crist Infante, MD  Cardiologist:   Heidi Sweeden Martinique, MD   Chief Complaint  Patient presents with   Hypertension   Hyperlipidemia      History of Present Illness: Heidi [name] is a 87 y.o. female who is seen at the request of Dr Joylene Draft for pre op CV assessment for TKR. I previously took care of her husband for many years. She did see me  in 2008 and records indicate she had an Echo and nuclear stress test that was normal. She does not have a history of cardiac disease. She has mild hypercholesterolemia and HTN. 2 years ago she fell and fractured her wrist and patella. Prior to that she was walking 2 miles a day without difficulty. Since then her activity has been more limited by arthritis. She denies any chest pain, SOB, palpitations, dizziness. Planning to have knee surgery by Dr Percell Miller with spinal anesthesia.   Past Medical History:  Diagnosis Date   Acute meniscal tear of left knee    Arthritis    Baker cyst, left    Chronic lower back pain    Cirrhosis, primary biliary 2008   followed by dr Trinna Balloon---  dx by liver bx 2008,  per md note no evidence on images/ labs, clinicially in remission taking ursodiol   Diverticulosis of colon    GERD (gastroesophageal reflux disease)    Heart murmur    per pt was told yrs ago by pcp but no work up done, pt asymptomatic   Hiatal hernia    History of basal cell carcinoma (BCC) excision 2008   forehead   Hx of colon cancer, stage I 2014   09-15-2012  s/p laparoscopy partial colectomy,  no chemo/ radiation;  previously followed by oncologist, dr Benay Spice per released for lov note in epic 12-20-2015 with no recurrence   Hyperlipidemia, mixed    Hypertension    followed by pcp   Left knee pain    Macular degeneration of both eyes    Osteoporosis    Raynaud disease    hands    Schatzki's ring    hx dilatation 11/ 2017    Ulcerative colitis    followed by dr Havery Moros---  dx 2006,  left side (rectosigmoiditis without complications)   Venous insufficiency    lower legs   Wears hearing aid in both ears     Past Surgical History:  Procedure Laterality Date   CATARACT EXTRACTION W/ INTRAOCULAR LENS  IMPLANT, BILATERAL Bilateral 2008   COLONOSCOPY     last one 01-19-2019 by dr Havery Moros   ESOPHAGOGASTRODUODENOSCOPY (EGD) WITH ESOPHAGEAL DILATION  03/14/2016   KNEE ARTHROSCOPY Right 1990's   KNEE ARTHROSCOPY Left 08/23/2015   Procedure: ARTHROSCOPY KNEE PARTIAL MEDIAL AND LATERAL MENISCECTOMY AND DEBRIDMENT;  Surgeon: Susa Day, MD;  Location: [facility];  Service: Orthopedics;  Laterality: Left;   KNEE ARTHROSCOPY WITH MEDIAL MENISECTOMY Left 12/18/2020   Procedure: KNEE ARTHROSCOPY WITH MEDIAL AND LATERAL MENISECTOMIES;  Surgeon: Renette Butters, MD;  Location: [place];  Service: Orthopedics;  Laterality: Left;   LAPAROSCOPIC PARTIAL COLECTOMY N/A 09/15/2012   Procedure: LAPAROSCOPIC ILEOCOLECTOMY;  Surgeon: Stark Klein, MD;  Location: [place];  Service: General;  Laterality: N/A;   ORIF PATELLA Right 09/27/2019   Procedure: OPEN REDUCTION INTERNAL (ORIF) FIXATION PATELLA;  Surgeon: Renette Butters, MD;  Location: [facility];  Service: Orthopedics;  Laterality: Right;   ORIF WRIST FRACTURE Left 09/27/2019   Procedure: OPEN REDUCTION INTERNAL FIXATION (ORIF) WRIST FRACTURE;  Surgeon: Renette Butters, MD;  Location: [facility];  Service: Orthopedics;  Laterality: Left;   TONSILLECTOMY  1952   TUBAL LIGATION  1973     Current Outpatient Medications  Medication Sig Dispense Refill   Acetaminophen (TYLENOL ARTHRITIS PAIN PO)      Chlorpheniramine Maleate (ALLERGY RELIEF PO) Take 1 tablet by mouth daily.     cholecalciferol (VITAMIN D3) 25 MCG (1000 UNIT) tablet Take 1,000 Units by mouth daily.     Cyanocobalamin (VITAMIN B 12) 500 MCG TABS Take 500 mcg by mouth daily. (Patient taking  differently: Take 1,000 mcg by mouth daily.) 30 tablet    ezetimibe (ZETIA) 10 MG tablet Take 5 mg by mouth 2 (two) times a week. Wednesday and Sunday.     fluticasone (FLONASE) 50 MCG/ACT nasal spray Place 1 spray into both nostrils daily as needed.  1   irbesartan (AVAPRO) 300 MG tablet Take 300 mg by mouth every evening.     mesalamine (LIALDA) 1.2 g EC tablet Take 2 tablets (2.4 g total) by mouth daily with breakfast. (Patient taking differently: Take 2.4 g by mouth daily with breakfast.) 180 tablet 3   Multiple Vitamins-Minerals (PRESERVISION AREDS PO) Take 1 tablet by mouth 2 (two) times daily.     omeprazole (PRILOSEC) 20 MG capsule TAKE ONE CAPSULE EACH DAY (Patient taking differently: Take 20 mg by mouth daily.) 30 capsule 6   ondansetron (ZOFRAN ODT) 4 MG disintegrating tablet Take 1 tablet (4 mg total) by mouth 2 (two) times daily as needed for nausea or vomiting. 10 tablet 0   Pitavastatin Calcium 4 MG TABS Take 0.5 tablets by mouth 2 (two) times a week. Wednesday and Sunday     ursodiol (ACTIGALL) 300 MG capsule TAKE 2 CAPSULES EVERY OTHER DAY AND TAKE 3 CAPSULES EVERY OTHER DAY. You are overdue for an office visit. Please call to schedule asap. Thank you 75 capsule 1   No current facility-administered medications for this visit.    Allergies:   Codeine and Penicillins    Social History:  The patient  reports that she quit smoking about 9 years ago. Her smoking use included cigarettes. She has a 20.00 pack-year smoking history. She has never used smokeless tobacco. She reports current alcohol use of about 7.0 standard drinks per week. She reports that she does not use drugs.   Family History:  The patient's family history includes Breast cancer in her paternal aunt; Heart disease in her brother and mother; Lung cancer in her brother; Ovarian cancer in her paternal grandmother; Stroke in her father.    ROS:  Please see the history of present illness.   Otherwise, review of systems  are positive for none.   All other systems are reviewed and negative.    PHYSICAL EXAM: VS:  BP (!) 142/86 (BP Location: Left Arm, Patient Position: Sitting, Cuff Size: Normal)    Pulse 65    Resp 20    Ht 5' 2"  (1.575 m)    Wt 131 lb 9.6 oz (59.7 kg)    SpO2 95%    BMI 24.07 kg/m  , BMI Body mass index is 24.07 kg/m. GEN: Well nourished, well developed, in no acute distress HEENT: normal Neck: no JVD, carotid bruits, or masses Cardiac: RRR; no murmurs, rubs, or gallops,no edema  Respiratory:  clear to auscultation bilaterally, normal work  of breathing GI: soft, nontender, nondistended, + BS MS: no deformity or atrophy Skin: warm and dry, no rash Neuro:  Strength and sensation are intact Psych: euthymic mood, full affect   EKG:  EKG is ordered today. The ekg ordered today demonstrates NSR rate 65. Normal. I have personally reviewed and interpreted this study.    Recent Labs: 12/18/2020: BUN 19; Creatinine, Ser 0.70; Hemoglobin 14.3; Potassium 4.3; Sodium 140    Lipid Panel No results found for: CHOL, TRIG, HDL, CHOLHDL, VLDL, LDLCALC, LDLDIRECT  Dated 08/14/20: cholesterol 217, triglycerides 57, HDL 80, LDL 126. TSH normal.    Wt Readings from Last 3 Encounters:  07/04/21 131 lb 9.6 oz (59.7 kg)  12/18/20 129 lb 1.6 oz (58.6 kg)  06/26/20 131 lb (59.4 kg)      Other studies Reviewed: Additional studies/ records that were reviewed today include: records from Dr Joylene Draft. Review of the above records demonstrates: see above.    ASSESSMENT AND PLAN:  1.  Pre op CV risk assessment. Overall I feel her risk is low. Main risk is her age. She has no known CV disease. She has no active CV complaints. Her planned surgery is not high risk and will be done under spinal anesthesia. I don't feel further cardiac testing is negative.  2. HTN on irbesartan.  3. Hypercholesterolemia. On very low dose livalo. Intolerant of higher dose statin.    Current medicines are reviewed at length  with the patient today.  The patient does not have concerns regarding medicines.  The following changes have been made:  no change  Labs/ tests ordered today include:  No orders of the defined types were placed in this encounter.        Disposition:   FU with me PRN  Signed, Heidi Street Martinique, MD  07/04/2021 2:38 PM    [facility] [address], [place], [place], [address] Phone [phone], Fax [phone]

## 2021-07-04 ENCOUNTER — Ambulatory Visit (INDEPENDENT_AMBULATORY_CARE_PROVIDER_SITE_OTHER): Payer: Medicare Other | Admitting: Cardiology

## 2021-07-04 ENCOUNTER — Encounter: Payer: Self-pay | Admitting: Cardiology

## 2021-07-04 ENCOUNTER — Other Ambulatory Visit: Payer: Self-pay

## 2021-07-04 VITALS — BP 142/86 | HR 65 | Resp 20 | Ht 62.0 in | Wt 131.6 lb

## 2021-07-04 DIAGNOSIS — E7800 Pure hypercholesterolemia, unspecified: Secondary | ICD-10-CM | POA: Diagnosis not present

## 2021-07-04 DIAGNOSIS — I10 Essential (primary) hypertension: Secondary | ICD-10-CM | POA: Diagnosis not present

## 2021-07-04 DIAGNOSIS — Z01818 Encounter for other preprocedural examination: Secondary | ICD-10-CM | POA: Diagnosis not present

## 2021-07-08 ENCOUNTER — Other Ambulatory Visit: Payer: Self-pay

## 2021-07-08 MED ORDER — MESALAMINE 1.2 G PO TBEC: 2.4000 g | DELAYED_RELEASE_TABLET | Freq: Every day | ORAL | 1 refills | Status: DC

## 2021-07-08 NOTE — Progress Notes (Signed)
Refill request for Mesalamine 2.4 g daily sent to [facility] in [place] ?

## 2021-07-19 DIAGNOSIS — M25561 Pain in right knee: Secondary | ICD-10-CM | POA: Diagnosis not present

## 2021-07-26 DIAGNOSIS — Z20822 Contact with and (suspected) exposure to covid-19: Secondary | ICD-10-CM | POA: Diagnosis not present

## 2021-08-02 ENCOUNTER — Ambulatory Visit: Payer: Medicare Other | Admitting: Gastroenterology

## 2021-08-21 ENCOUNTER — Ambulatory Visit (INDEPENDENT_AMBULATORY_CARE_PROVIDER_SITE_OTHER): Payer: Medicare Other | Admitting: Gastroenterology

## 2021-08-21 ENCOUNTER — Other Ambulatory Visit (INDEPENDENT_AMBULATORY_CARE_PROVIDER_SITE_OTHER): Payer: Medicare Other

## 2021-08-21 ENCOUNTER — Encounter: Payer: Self-pay | Admitting: Gastroenterology

## 2021-08-21 VITALS — BP 138/80 | HR 62 | Ht 62.0 in | Wt 129.0 lb

## 2021-08-21 DIAGNOSIS — I10 Essential (primary) hypertension: Secondary | ICD-10-CM | POA: Diagnosis not present

## 2021-08-21 DIAGNOSIS — K5130 Ulcerative (chronic) rectosigmoiditis without complications: Secondary | ICD-10-CM

## 2021-08-21 DIAGNOSIS — K743 Primary biliary cirrhosis: Secondary | ICD-10-CM

## 2021-08-21 LAB — CBC WITH DIFFERENTIAL/PLATELET
Basophils Absolute: 0 10*3/uL (ref 0.0–0.1)
Basophils Relative: 0.4 % (ref 0.0–3.0)
Eosinophils Absolute: 0.1 10*3/uL (ref 0.0–0.7)
Eosinophils Relative: 1.5 % (ref 0.0–5.0)
HCT: 41.2 % (ref 36.0–46.0)
Hemoglobin: 13.7 g/dL (ref 12.0–15.0)
Lymphocytes Relative: 27.5 % (ref 12.0–46.0)
Lymphs Abs: 1.5 10*3/uL (ref 0.7–4.0)
MCHC: 33.2 g/dL (ref 30.0–36.0)
MCV: 98.2 fl (ref 78.0–100.0)
Monocytes Absolute: 0.6 10*3/uL (ref 0.1–1.0)
Monocytes Relative: 10.6 % (ref 3.0–12.0)
Neutro Abs: 3.2 10*3/uL (ref 1.4–7.7)
Neutrophils Relative %: 60 % (ref 43.0–77.0)
Platelets: 211 10*3/uL (ref 150.0–400.0)
RBC: 4.2 Mil/uL (ref 3.87–5.11)
RDW: 13.7 % (ref 11.5–15.5)
WBC: 5.3 10*3/uL (ref 4.0–10.5)

## 2021-08-21 LAB — COMPREHENSIVE METABOLIC PANEL
ALT: 13 U/L (ref 0–35)
AST: 15 U/L (ref 0–37)
Albumin: 4 g/dL (ref 3.5–5.2)
Alkaline Phosphatase: 59 U/L (ref 39–117)
BUN: 22 mg/dL (ref 6–23)
CO2: 26 mEq/L (ref 19–32)
Calcium: 8.7 mg/dL (ref 8.4–10.5)
Chloride: 106 mEq/L (ref 96–112)
Creatinine, Ser: 0.78 mg/dL (ref 0.40–1.20)
GFR: 69.45 mL/min (ref >60.00)
Glucose, Bld: 95 mg/dL (ref 70–99)
Potassium: 4.2 mEq/L (ref 3.5–5.1)
Sodium: 140 mEq/L (ref 135–145)
Total Bilirubin: 0.7 mg/dL (ref 0.2–1.2)
Total Protein: 6.9 g/dL (ref 6.0–8.3)

## 2021-08-21 LAB — TSH: TSH: 1.27 u[IU]/mL (ref 0.35–5.50)

## 2021-08-21 LAB — VITAMIN D 25 HYDROXY (VIT D DEFICIENCY, FRACTURES): VITD: 36.82 ng/mL (ref 30.00–100.00)

## 2021-08-21 NOTE — Progress Notes (Signed)
? ?HPI :  ?87 y/o female here for a follow up visit to discuss the following issues as outlined: ?  ?UC - left sided colitis. Dx 15 years ago. Recall she has been treated with mesalamine monotherapy over the years.  Currently on Lialda 2 tabs daily, she has done well with this without flares for some time.  Her last colonoscopy was in 2020, she had no active disease and in remission.  Her last 3 colonoscopies have not shown any activity.  She states that she has had a bit erratic bowel habits since have last seen her for the past few months.  She will have days where she has completely normal stools without problems.  Then she will have occasional episodes of diarrhea with some urgency.  She has not had any fecal incontinence but has been wearing a pad lately just in case she has leakage.  She denies any blood in her stools.  No abdominal pains but she does have some bloating that can bother her periodically.  She also says her energy is a bit lower than it used to be.  She has not had labs in a year or so.  She has an upcoming knee replacement in May.  She is traveling to [place] in a few weeks before that, she is otherwise had COVID and recovered from that since I have seen her. ?  ?PBC - diagnosed via liver biopsy around 2008. On Ursodiol with adjusted dosed for weight, 13-13m /kg. Based on her weight she has alternated 6049mone day and then 90028mhe next.   She has had no evidence of cirrhosis on prior imaging or labs.  LFTs have remained normal as has TSH. History of osteopenia followed by Dr. PerJoylene Draftast labs about a year ago. She had a R knee fracture about 1-2 years ago she has recovered from that. ?  ?Recent colonoscopies: ?Colonoscopy 10/10/2016 - healthy colo-colonic anastomosis in the ascending colon. terminal ileum appeared normal. Many medium-mouthed diverticula were found in the left colon. A 10 mm polyp was found in the descending colon. The exam was otherwise without abnormality. Biopsies were  taken with a cold forceps from the left colon for ulcerative colitis surveillance. ?  ?Colonoscopy 10/08/2017 - healthy ileo-colonic anastomosis in the ascending colon. the terminal ileum appeared normal. ?- Two flat polyps were found in the ascending colon. The polyps were 3 to 4 mm in size. Two flat polyps were found in the descending colon. The polyps were 3 to 4 mm in size. A 3 mm polyp was found in the sigmoid colon. The polyp was sessile. Multiple medium-mouthed diverticula were found in the left colon. Internal hemorrhoids were found during retroflexion. Minimally active colitis ?  ?Colonoscopy 01/19/2019 - - The perianal and digital rectal examinations were normal. ?- The terminal ileum appeared normal. ?- There was evidence of a prior end-to-side ileo-colonic anastomosis in the ascending colon. ?This was patent and was characterized by healthy appearing mucosa. ?- A 3 mm polyp was found in the transverse colon. The polyp was flat. The polyp was ?removed with a cold snare. Resection and retrieval were complete. ?- Numerous flat polyps were noted in the rectum and rectosigmoid colon consistent with ?benign hyperplastic polyps. Two representative polyps in the recto-sigmoid colon, 5 to 6 mm ?in size, were removed with a cold snare as a representative sample. Resection and retrieval ?were complete. ?- Multiple large-mouthed diverticula were found in the sigmoid colon. ?- The exam was otherwise without abnormality. ?-  Biopsies were taken with a cold forceps from the descending colon, sigmoid colon and ?rectum for ulcerative colitis surveillance. These biopsy specimens were sent to Pathology. ?  ?1. Surgical [P], colon, transverse, polyp ?- HYPERPLASTIC POLYP. ?- NO DYSPLASIA OR MALIGNANCY. ?2. Surgical [P], left colon ?- BENIGN COLONIC MUCOSA. ?- NO ACTIVE INFLAMMATION. ?- NO DYSPLASIA OR MALIGNANCY. ?3. Surgical [P], colon, rectosigmoid, polyp (2) ?- HYPERPLASTIC POLYP (X2 FRAGMENTS). ?- NO DYSPLASIA OR  MALIGNANCY. ?  ?Past Medical History:  ?Diagnosis Date  ? Acute meniscal tear of left knee   ? Arthritis   ? Baker cyst, left   ? Chronic lower back pain   ? Cirrhosis, primary biliary 2008  ? followed by dr Trinna Balloon---  dx by liver bx 2008,  per md note no evidence on images/ labs, clinicially in remission taking ursodiol  ? Diverticulosis of colon   ? GERD (gastroesophageal reflux disease)   ? Heart murmur   ? per pt was told yrs ago by pcp but no work up done, pt asymptomatic  ? Hiatal hernia   ? History of basal cell carcinoma (BCC) excision 2008  ? forehead  ? Hx of colon cancer, stage I 2014  ? 09-15-2012  s/p laparoscopy partial colectomy,  no chemo/ radiation;  previously followed by oncologist, dr Benay Spice per released for lov note in epic 12-20-2015 with no recurrence  ? Hyperlipidemia, mixed   ? Hypertension   ? followed by pcp  ? Left knee pain   ? Macular degeneration of both eyes   ? Osteoporosis   ? Raynaud disease   ? hands   ? Schatzki's ring   ? hx dilatation 11/ 2017  ? Ulcerative colitis   ? followed by dr Havery Moros---  dx 2006,  left side (rectosigmoiditis without complications)  ? Venous insufficiency   ? lower legs  ? Wears hearing aid in both ears   ? ? ? ?Past Surgical History:  ?Procedure Laterality Date  ? CATARACT EXTRACTION W/ INTRAOCULAR LENS  IMPLANT, BILATERAL Bilateral 2008  ? COLONOSCOPY    ? last one 01-19-2019 by dr Havery Moros  ? ESOPHAGOGASTRODUODENOSCOPY (EGD) WITH ESOPHAGEAL DILATION  03/14/2016  ? KNEE ARTHROSCOPY Right 1990's  ? KNEE ARTHROSCOPY Left 08/23/2015  ? Procedure: ARTHROSCOPY KNEE PARTIAL MEDIAL AND LATERAL MENISCECTOMY AND DEBRIDMENT;  Surgeon: Susa Day, MD;  Location: [facility];  Service: Orthopedics;  Laterality: Left;  ? KNEE ARTHROSCOPY WITH MEDIAL MENISECTOMY Left 12/18/2020  ? Procedure: KNEE ARTHROSCOPY WITH MEDIAL AND LATERAL MENISECTOMIES;  Surgeon: Renette Butters, MD;  Location: [place];  Service: Orthopedics;  Laterality:  Left;  ? LAPAROSCOPIC PARTIAL COLECTOMY N/A 09/15/2012  ? Procedure: LAPAROSCOPIC ILEOCOLECTOMY;  Surgeon: Stark Klein, MD;  Location: [place];  Service: General;  Laterality: N/A;  ? ORIF PATELLA Right 09/27/2019  ? Procedure: OPEN REDUCTION INTERNAL (ORIF) FIXATION PATELLA;  Surgeon: Renette Butters, MD;  Location[facility];  Service: Orthopedics;  Laterality: Right;  ? ORIF WRIST FRACTURE Left 09/27/2019  ? Procedure: OPEN REDUCTION INTERNAL FIXATION (ORIF) WRIST FRACTURE;  Surgeon: Renette Butters, MD;  Location[facility];  Service: Orthopedics;  Laterality: Left;  ? TONSILLECTOMY  1952  ? TUBAL LIGATION  1973  ? ?Family History  ?Problem Relation Age of Onset  ? Heart disease Mother   ? Heart disease Brother   ? Stroke Father   ? Lung cancer Brother   ? Breast cancer Paternal Aunt   ? Ovarian cancer Paternal Grandmother   ? Colon  cancer Neg Hx   ? Esophageal cancer Neg Hx   ? Stomach cancer Neg Hx   ? Pancreatic cancer Neg Hx   ? Rectal cancer Neg Hx   ? ?Social History  ? ?Tobacco Use  ? Smoking status: Former  ?  Packs/day: 0.50  ?  Years: 40.00  ?  Pack years: 20.00  ?  Types: Cigarettes  ?  Quit date: 01/16/2012  ?  Years since quitting: 9.6  ? Smokeless tobacco: Never  ?Vaping Use  ? Vaping Use: Never used  ?Substance Use Topics  ? Alcohol use: Yes  ?  Alcohol/week: 7.0 standard drinks  ?  Types: 7 Glasses of wine per week  ?  Comment: 12-13-2020  one wine daily  ? Drug use: No  ? ?Current Outpatient Medications  ?Medication Sig Dispense Refill  ? Acetaminophen (TYLENOL ARTHRITIS PAIN PO)     ? Chlorpheniramine Maleate (ALLERGY RELIEF PO) Take 1 tablet by mouth daily.    ? cholecalciferol (VITAMIN D3) 25 MCG (1000 UNIT) tablet Take 1,000 Units by mouth daily.    ? Cyanocobalamin (VITAMIN B 12) 500 MCG TABS Take 500 mcg by mouth daily. (Patient taking differently: Take 1,000 mcg by mouth daily.) 30 tablet   ? ezetimibe (ZETIA) 10 MG tablet Take 5 mg by mouth 2 (two) times a week. Wednesday and Sunday.    ?  fluticasone (FLONASE) 50 MCG/ACT nasal spray Place 1 spray into both nostrils daily as needed.  1  ? irbesartan (AVAPRO) 300 MG tablet Take 300 mg by mouth every evening.    ? mesalamine (LIALDA) 1.2 g EC tablet Take 2

## 2021-08-21 NOTE — Progress Notes (Unsigned)
Labs faxed to Dr. Silvestre Mesi office.  ?

## 2021-08-21 NOTE — Patient Instructions (Signed)
If you are age 85 or older, your body mass index should be between 23-30. Your Body mass index is 23.59 kg/m?Marland Kitchen If this is out of the aforementioned range listed, please consider follow up with your Primary Care Provider. ? ?If you are age 91 or younger, your body mass index should be between 19-25. Your Body mass index is 23.59 kg/m?Marland Kitchen If this is out of the aformentioned range listed, please consider follow up with your Primary Care Provider.  ? ?________________________________________________________ ? ?The [facility] providers would like to encourage you to use [facility] to communicate with providers for non-urgent requests or questions.  Due to long hold times on the telephone, sending your provider a message by [facility] may be a faster and more efficient way to get a response.  Please allow 48 business hours for a response.  Please remember that this is for non-urgent requests.  ?_______________________________________________________ ? ?Please go to the lab in the basement of our building to have lab work done as you leave today. Hit "B" for basement when you get on the elevator.  When the doors open the lab is on your left.  We will call you with the results. Thank you. ? ?Continue Ursodiol. ?Continue Lialda. ? ?Thank you for entrusting me with your care and for choosing [employer], ?Dr. [place] Cellar ? ? ?

## 2021-08-22 ENCOUNTER — Other Ambulatory Visit: Payer: Medicare Other

## 2021-08-22 DIAGNOSIS — Z20822 Contact with and (suspected) exposure to covid-19: Secondary | ICD-10-CM | POA: Diagnosis not present

## 2021-08-22 DIAGNOSIS — K5130 Ulcerative (chronic) rectosigmoiditis without complications: Secondary | ICD-10-CM

## 2021-08-22 DIAGNOSIS — K743 Primary biliary cirrhosis: Secondary | ICD-10-CM

## 2021-08-26 DIAGNOSIS — M25562 Pain in left knee: Secondary | ICD-10-CM | POA: Diagnosis not present

## 2021-08-26 LAB — CALPROTECTIN, FECAL: Calprotectin, Fecal: 33 ug/g (ref 0–120)

## 2021-08-26 NOTE — H&P (Signed)
KNEE ARTHROPLASTY ADMISSION H&P ? ?Patient ID: ?Heidi [name] ?MRN: [id number] ?DOB/AGE: [birth year] 87 y.o. ? ?Chief Complaint: left knee pain. ? ?Planned Procedure Date: 09/17/21 ?Medical Clearance by Dr. Crist Infante   ?Cardiac Clearance by Dr. Peter Martinique ?GI Clearance by Dr.  Cellar  ? ?HPI: ?Heidi [name] is a 87 y.o. female who presents for evaluation of OA LEFT KNEE. The patient has a history of pain and functional disability in the left knee due to arthritis and has failed non-surgical conservative treatments for greater than 12 weeks to include NSAID's and/or analgesics, corticosteriod injections, supervised PT with diminished ADL's post treatment, use of assistive devices, and activity modification.  Onset of symptoms was gradual, starting 3 years ago with gradually worsening course since that time. The patient noted prior procedures on the knee to include  arthroscopy and menisectomy on the left knee.  Patient currently rates pain at 8 out of 10 with activity. Patient has night pain, worsening of pain with activity and weight bearing, and pain that interferes with activities of daily living.  Patient has evidence of subchondral sclerosis, periarticular osteophytes, and joint space narrowing by imaging studies.  There is no active infection. ? ?Past Medical History:  ?Diagnosis Date  ? Acute meniscal tear of left knee   ? Arthritis   ? Baker cyst, left   ? Chronic lower back pain   ? Cirrhosis, primary biliary 2008  ? followed by dr Trinna Balloon---  dx by liver bx 2008,  per md note no evidence on images/ labs, clinicially in remission taking ursodiol  ? Diverticulosis of colon   ? GERD (gastroesophageal reflux disease)   ? Heart murmur   ? per pt was told yrs ago by pcp but no work up done, pt asymptomatic  ? Hiatal hernia   ? History of basal cell carcinoma (BCC) excision 2008  ? forehead  ? Hx of colon cancer, stage I 2014  ? 09-15-2012  s/p laparoscopy partial colectomy,  no chemo/  radiation;  previously followed by oncologist, dr Benay Spice per released for lov note in epic 12-20-2015 with no recurrence  ? Hyperlipidemia, mixed   ? Hypertension   ? followed by pcp  ? Left knee pain   ? Macular degeneration of both eyes   ? Osteoporosis   ? Raynaud disease   ? hands   ? Schatzki's ring   ? hx dilatation 11/ 2017  ? Ulcerative colitis   ? followed by dr Havery Moros---  dx 2006,  left side (rectosigmoiditis without complications)  ? Venous insufficiency   ? lower legs  ? Wears hearing aid in both ears   ? ?Past Surgical History:  ?Procedure Laterality Date  ? CATARACT EXTRACTION W/ INTRAOCULAR LENS  IMPLANT, BILATERAL Bilateral 2008  ? COLONOSCOPY    ? last one 01-19-2019 by dr Havery Moros  ? ESOPHAGOGASTRODUODENOSCOPY (EGD) WITH ESOPHAGEAL DILATION  03/14/2016  ? KNEE ARTHROSCOPY Right 1990's  ? KNEE ARTHROSCOPY Left 08/23/2015  ? Procedure: ARTHROSCOPY KNEE PARTIAL MEDIAL AND LATERAL MENISCECTOMY AND DEBRIDMENT;  Surgeon: Susa Day, MD;  Location: [facility];  Service: Orthopedics;  Laterality: Left;  ? KNEE ARTHROSCOPY WITH MEDIAL MENISECTOMY Left 12/18/2020  ? Procedure: KNEE ARTHROSCOPY WITH MEDIAL AND LATERAL MENISECTOMIES;  Surgeon: Renette Butters, MD;  Location: [place];  Service: Orthopedics;  Laterality: Left;  ? LAPAROSCOPIC PARTIAL COLECTOMY N/A 09/15/2012  ? Procedure: LAPAROSCOPIC ILEOCOLECTOMY;  Surgeon: Stark Klein, MD;  Location: [place];  Service: General;  Laterality: N/A;  ?  ORIF PATELLA Right 09/27/2019  ? Procedure: OPEN REDUCTION INTERNAL (ORIF) FIXATION PATELLA;  Surgeon: Renette Butters, MD;  Location: [facility];  Service: Orthopedics;  Laterality: Right;  ? ORIF WRIST FRACTURE Left 09/27/2019  ? Procedure: OPEN REDUCTION INTERNAL FIXATION (ORIF) WRIST FRACTURE;  Surgeon: Renette Butters, MD;  Location: [facility];  Service: Orthopedics;  Laterality: Left;  ? TONSILLECTOMY  1952  ? TUBAL LIGATION  1973  ? ?Allergies  ?Allergen Reactions  ? Codeine Nausea And  Vomiting  ? Penicillins Hives  ? ?Prior to Admission medications   ?Medication Sig Start Date End Date Taking? Authorizing Provider  ?Acetaminophen (TYLENOL ARTHRITIS PAIN PO)     [provider]  ?Chlorpheniramine Maleate (ALLERGY RELIEF PO) Take 1 tablet by mouth daily.    [provider]  ?cholecalciferol (VITAMIN D3) 25 MCG (1000 UNIT) tablet Take 1,000 Units by mouth daily.    [provider]  ?Cyanocobalamin (VITAMIN B 12) 500 MCG TABS Take 500 mcg by mouth daily. ?Patient taking differently: Take 1,000 mcg by mouth daily. 09/29/19   Oretha Milch D, MD  ?ezetimibe (ZETIA) 10 MG tablet Take 5 mg by mouth 2 (two) times a week. Wednesday and Sunday.    [provider]  ?fluticasone (FLONASE) 50 MCG/ACT nasal spray Place 1 spray into both nostrils daily as needed. 01/19/18   [provider]  ?irbesartan (AVAPRO) 300 MG tablet Take 300 mg by mouth every evening. 09/14/19   [provider]  ?mesalamine (LIALDA) 1.2 g EC tablet Take 2 tablets (2.4 g total) by mouth daily with breakfast. 07/08/21   Armbruster, Carlota Raspberry, MD  ?Multiple Vitamins-Minerals (PRESERVISION AREDS PO) Take 1 tablet by mouth 2 (two) times daily.    [provider]  ?omeprazole (PRILOSEC) 20 MG capsule TAKE ONE CAPSULE EACH DAY ?Patient taking differently: Take 20 mg by mouth daily. 06/02/14   Inda Castle, MD  ?ondansetron (ZOFRAN ODT) 4 MG disintegrating tablet Take 1 tablet (4 mg total) by mouth 2 (two) times daily as needed for nausea or vomiting. 12/18/20   Britt Bottom, PA-C  ?Pitavastatin Calcium 4 MG TABS Take 0.5 tablets by mouth 2 (two) times a week. Wednesday and Sunday    [provider]  ?ursodiol (ACTIGALL) 300 MG capsule TAKE 2 CAPSULES EVERY OTHER DAY AND TAKE 3 CAPSULES EVERY OTHER DAY. You are overdue for an office visit. Please call to schedule asap. Thank you 06/24/21   Armbruster, Carlota Raspberry, MD  ? ?Social History  ? ?Socioeconomic History  ? Marital  status: Widowed  ?  Spouse name: Not on file  ? Number of children: 3  ? Years of education: Not on file  ? Highest education level: Not on file  ?Occupational History  ? Occupation: Compliance  ?  Comment: insurance  ?  Employer: [employer]  ?Tobacco Use  ? Smoking status: Former  ?  Packs/day: 0.50  ?  Years: 40.00  ?  Pack years: 20.00  ?  Types: Cigarettes  ?  Quit date: 01/16/2012  ?  Years since quitting: 9.6  ? Smokeless tobacco: Never  ?Vaping Use  ? Vaping Use: Never used  ?Substance and Sexual Activity  ? Alcohol use: Yes  ?  Alcohol/week: 7.0 standard drinks  ?  Types: 7 Glasses of wine per week  ?  Comment: 12-13-2020  one wine daily  ? Drug use: No  ? Sexual activity: Not Currently  ?Other Topics Concern  ? Not on  file  ?Social History Narrative  ? Widowed--lives alone  ? Drives and independent in ADLs  ? 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 ?Social Determinants of Health  ? ?Financial Resource Strain: Not on file  ?Food Insecurity: Not on file  ?Transportation Needs: Not on file  ?Physical Activity: Not on file  ?Stress: Not on file  ?Social Connections: Not on file  ? ?Family History  ?Problem Relation Age of Onset  ? Heart disease Mother   ? Heart disease Brother   ? Stroke Father   ? Lung cancer Brother   ? Breast cancer Paternal Aunt   ? Ovarian cancer Paternal Grandmother   ? Colon cancer Neg Hx   ? Esophageal cancer Neg Hx   ? Stomach cancer Neg Hx   ? Pancreatic cancer Neg Hx   ? Rectal cancer Neg Hx   ? ? ?ROS: Currently denies lightheadedness, dizziness, Fever, chills, CP, SOB.   ?No personal history of DVT, PE, MI, or CVA. ?No loose teeth or dentures ?All other systems have been reviewed and were otherwise currently negative with the exception of those mentioned in the HPI and as above. ? ?Objective: ?Vitals: Ht: 5'1" Wt: 124.8 lbs Temp: 97.4 BP: 163/94 Pulse: 76 O2 94% on room air.   ?Physical Exam: ?General: Alert, NAD.  Antalgic Gait  ?HEENT: EOMI, Good Neck Extension  ?Pulm: No  increased work of breathing.  Clear B/L A/P w/o crackle or wheeze.  ?CV: RRR, No m/g/r appreciated  ?GI: soft, NT, ND. BS x 4 quadrants ?Neuro: CN II-XII grossly intact without focal deficit.  Sensation intact di

## 2021-08-27 ENCOUNTER — Other Ambulatory Visit: Payer: Self-pay | Admitting: Gastroenterology

## 2021-09-03 NOTE — Progress Notes (Addendum)
COVID Vaccine Completed:  Yes x2 ?Date COVID Vaccine completed: ?Has received booster:  Yes x2 ?COVID vaccine manufacturer: Pfizer     ? ?Date of COVID positive in last 90 days:  07-25-21 ? ?PCP - Crist Infante, MD (note on chart) ?Cardiologist - Peter Martinique, MD for preop eval, no cardiac disease  ? ?Cardiac clearance in note dated 07-04-21 by Dr. Peter Martinique ? ?Medical clearance on chart dated 07-17-21 by Dr. Crist Infante ? ?Chest x-ray - N/A ?EKG - 07-04-21 Epic ?Stress Test - greater than 2 years  ?ECHO - greater than 2 years  ?Cardiac Cath - N/A ?Pacemaker/ICD device last checked: ?Spinal Cord Stimulator: ? ?Bowel Prep - N/A ? ?Sleep Study - N/A ?CPAP -  ? ?Fasting Blood Sugar - N/A ?Checks Blood Sugar _____ times a day ? ?Blood Thinner Instructions:  N/A ?Aspirin Instructions: ?Last Dose: ? ?Activity level:   Can go up a flight of stairs and perform activities of daily living without stopping and without symptoms of chest pain or shortness of breath.  ?   ?Anesthesia review:   Hx of heart murmur, recent eval by Dr. Martinique and no murmur heard.   ? ?BP elevated at PAT 178/94 and on recheck 175/109.  Patient asymptomatic.  States that she monitors at home and it usually runs in the 140s/80s.  She will contact PCP if she notes continued elevation.  ? ?Patient denies shortness of breath, fever, cough and chest pain at PAT appointment ? ? ?Patient verbalized understanding of instructions that were given to them at the PAT appointment. Patient was also instructed that they will need to review over the PAT instructions again at home before surgery.  ?

## 2021-09-03 NOTE — Patient Instructions (Addendum)
DUE TO COVID-19 ONLY TWO VISITORS  (aged 85 and older)  IS ALLOWED TO COME WITH YOU AND STAY IN THE WAITING ROOM ONLY DURING PRE OP AND PROCEDURE.   ?**NO VISITORS ARE ALLOWED IN THE SHORT STAY AREA OR RECOVERY ROOM!!** ? ?IF YOU WILL BE ADMITTED INTO THE HOSPITAL YOU ARE ALLOWED ONLY FOUR SUPPORT PEOPLE DURING VISITATION HOURS ONLY (7 AM -8PM)   ?The support person(s) must pass our screening, gel in and out ?Visitors GUEST BADGE MUST BE WORN VISIBLY  ?One adult visitor may remain with you overnight and MUST be in the room by 8 P.M.  ? ?You are not required to quarantine ?Hand Hygiene often ?Do NOT share personal items ?Notify your provider if you are in close contact with someone who has COVID or you develop fever 100.4 or greater, new onset of sneezing, cough, sore throat, shortness of breath or body aches. ? ?     ? Your procedure is scheduled on:  09-17-21 ? ? Report to [facility] Main Entrance ? ?  Report to admitting at 5:15 AM ? ? Call this number if you have problems the morning of surgery [phone] ? ? Do not eat food :After Midnight. ? ? After Midnight you may have the following liquids until 4:30 AM DAY OF SURGERY ? ?Water ?Black Coffee (sugar ok, NO MILK/CREAM OR CREAMERS)  ?Tea (sugar ok, NO MILK/CREAM OR CREAMERS) regular and decaf                             ?Plain Jell-O (NO RED)                                           ?Fruit ices (not with fruit pulp, NO RED)                                     ?Popsicles (NO RED)                                                                  ?Juice: apple, WHITE grape, WHITE cranberry ?Sports drinks like Gatorade (NO RED) ?Clear broth(vegetable,chicken,beef) ? ?             ?  ?  ?The day of surgery:  ?Drink ONE (1) Pre-Surgery Clear Ensure at 4:30 AM the morning of surgery. Drink in one sitting. Do not sip.  ?This drink was given to you during your hospital  ?pre-op appointment visit. ?Nothing else to drink after completing the Pre-Surgery Clear  Ensure. ?  ?       If you have questions, please contact your surgeon?s office. ? ? ?FOLLOW  ANY ADDITIONAL PRE OP INSTRUCTIONS YOU RECEIVED FROM YOUR SURGEON'S OFFICE!!! ?  ?  ?Oral Hygiene is also important to reduce your risk of infection.                                    ?Remember - BRUSH  YOUR TEETH THE MORNING OF SURGERY WITH YOUR REGULAR TOOTHPASTE ? ? Do NOT smoke after Midnight ? ? Take these medicines the morning of surgery with A SIP OF WATER: Omeprazole,  Ursodiol, Chlorpheniramine.  Okay to use nasal spray ?                  ?           You may not have any metal on your body including hair pins, jewelry, and body piercing ? ?           Do not wear make-up, lotions, powders, perfumes or deodorant ? ?Do not wear nail polish including gel and S&S, artificial/acrylic nails, or any other type of covering on natural nails including finger and toenails. If you have artificial nails, gel coating, etc. that needs to be removed by a nail salon please have this removed prior to surgery or surgery may need to be canceled/ delayed if the surgeon/ anesthesia feels like they are unable to be safely monitored.  ? ?Do not shave  48 hours prior to surgery.  ? ? Do not bring valuables to the hospital. [place]. ? ? Contacts, dentures or bridgework may not be worn into surgery. ? ? Bring small overnight bag day of surgery. ?  ?Special Instructions: Bring a copy of your healthcare power of attorney and living will documents the day of surgery if you haven't scanned them before. ? ?Please read over the following fact sheets you were given: IF [place] ? ?[place] - Preparing for Surgery ?Before surgery, you can play an important role.  Because skin is not sterile, your skin needs to be as free of germs as possible.  You can reduce the number of germs on your skin by washing with CHG (chlorahexidine gluconate)  soap before surgery.  CHG is an antiseptic cleaner which kills germs and bonds with the skin to continue killing germs even after washing. ?Please DO NOT use if you have an allergy to CHG or antibacterial soaps.  If your skin becomes reddened/irritated stop using the CHG and inform your nurse when you arrive at Short Stay. ?Do not shave (including legs and underarms) for at least 48 hours prior to the first CHG shower.  You may shave your face/neck. ? ?Please follow these instructions carefully: ? 1.  Shower with CHG Soap the night before surgery and the  morning of surgery. ? 2.  If you choose to wash your hair, wash your hair first as usual with your normal  shampoo. ? 3.  After you shampoo, rinse your hair and body thoroughly to remove the shampoo.                            ? 4.  Use CHG as you would any other liquid soap.  You can apply chg directly to the skin and wash.  Gently with a scrungie or clean washcloth. ? 5.  Apply the CHG Soap to your body ONLY FROM THE NECK DOWN.   Do   not use on face/ open      ?                     Wound or open sores. Avoid contact with eyes, ears mouth and   genitals (private parts).  ?  Wash face,  Genitals (private parts) with your normal soap. ?            6.  Wash thoroughly, paying special attention to the area where your    surgery  will be performed. ? 7.  Thoroughly rinse your body with warm water from the neck down. ? 8.  DO NOT shower/wash with your normal soap after using and rinsing off the CHG Soap. ?               9.  Pat yourself dry with a clean towel. ?           10.  Wear clean pajamas. ?           11.  Place clean sheets on your bed the night of your first shower and do not  sleep with pets. ?Day of Surgery : ?Do not apply any lotions/deodorants the morning of surgery.  Please wear clean clothes to the hospital/surgery center. ? ?FAILURE TO FOLLOW THESE INSTRUCTIONS MAY RESULT IN THE CANCELLATION OF YOUR SURGERY ? ?PATIENT  SIGNATURE_________________________________ ? ?NURSE SIGNATURE__________________________________ ? ?________________________________________________________________________  ?  ? ?Incentive Spirometer ? ?An incentive spirometer is a tool that can help keep your lungs clear and active. This tool measures how well you are filling your lungs with each breath. Taking long deep breaths may help reverse or decrease the chance of developing breathing (pulmonary) problems (especially infection) following: ?A long period of time when you are unable to move or be active. ?BEFORE THE PROCEDURE  ?If the spirometer includes an indicator to show your best effort, your nurse or respiratory therapist will set it to a desired goal. ?If possible, sit up straight or lean slightly forward. Try not to slouch. ?Hold the incentive spirometer in an upright position. ?INSTRUCTIONS FOR USE  ?Sit on the edge of your bed if possible, or sit up as far as you can in bed or on a chair. ?Hold the incentive spirometer in an upright position. ?Breathe out normally. ?Place the mouthpiece in your mouth and seal your lips tightly around it. ?Breathe in slowly and as deeply as possible, raising the piston or the ball toward the top of the column. ?Hold your breath for 3-5 seconds or for as long as possible. Allow the piston or ball to fall to the bottom of the column. ?Remove the mouthpiece from your mouth and breathe out normally. ?Rest for a few seconds and repeat Steps 1 through 7 at least 10 times every 1-2 hours when you are awake. Take your time and take a few normal breaths between deep breaths. ?The spirometer may include an indicator to show your best effort. Use the indicator as a goal to work toward during each repetition. ?After each set of 10 deep breaths, practice coughing to be sure your lungs are clear. If you have an incision (the cut made at the time of surgery), support your incision when coughing by placing a pillow or rolled up  towels firmly against it. ?Once you are able to get out of bed, walk around indoors and cough well. You may stop using the incentive spirometer when instructed by your caregiver.  ?RISKS AND COMPLICATIONS ?Take your time so yo

## 2021-09-04 ENCOUNTER — Other Ambulatory Visit: Payer: Self-pay

## 2021-09-04 ENCOUNTER — Encounter (HOSPITAL_COMMUNITY)
Admission: RE | Admit: 2021-09-04 | Discharge: 2021-09-04 | Disposition: A | Discharge status: Home / Self Care | Payer: Medicare Other | Source: Ambulatory Visit | Attending: Orthopedic Surgery | Admitting: Orthopedic Surgery

## 2021-09-04 ENCOUNTER — Encounter (HOSPITAL_COMMUNITY): Payer: Self-pay

## 2021-09-04 DIAGNOSIS — Z01812 Encounter for preprocedural laboratory examination: Secondary | ICD-10-CM | POA: Diagnosis not present

## 2021-09-04 DIAGNOSIS — Z01818 Encounter for other preprocedural examination: Secondary | ICD-10-CM

## 2021-09-04 HISTORY — DX: Malignant neoplasm of colon, unspecified: C18.9

## 2021-09-04 LAB — SURGICAL PCR SCREEN
MRSA, PCR: NEGATIVE
Staphylococcus aureus: NEGATIVE

## 2021-09-05 NOTE — Anesthesia Preprocedure Evaluation (Addendum)
Anesthesia Evaluation  ?Patient identified by MRN, date of birth, ID band ?Patient awake ? ? ? ?Reviewed: ?Allergy & Precautions, NPO status , Patient's Chart, lab work & pertinent test results, reviewed documented beta blocker date and time  ? ?Airway ?Mallampati: II ? ?TM Distance: >3 FB ?Neck ROM: Full ? ? ? Dental ?no notable dental hx. ?(+) Teeth Intact, Dental Advisory Given ?  ?Pulmonary ?former smoker,  ?  ?Pulmonary exam normal ?breath sounds clear to auscultation ? ? ? ? ? ? Cardiovascular ?hypertension, Pt. on medications ?Normal cardiovascular exam ?Rhythm:Regular Rate:Normal ? ?EKG 07/05/21 ?NSR, Normal EKG ?  ?Neuro/Psych ?negative neurological ROS ? negative psych ROS  ? GI/Hepatic ?hiatal hernia, PUD, GERD  Medicated and Controlled,(+) Hepatitis -, UnspecifiedHx/o Primary biliary cirrhosis ?Hx/o Cecal Ca S/P partial colectomy ?  ?Endo/Other  ?Hyperlipidemia ? Renal/GU ?negative Renal ROS  ?negative genitourinary ?  ?Musculoskeletal ? ?(+) Arthritis , Osteoarthritis,  DJD left knee  ? Abdominal ?  ?Peds ? Hematology ?negative hematology ROS ?(+)   ?Anesthesia Other Findings ? ? Reproductive/Obstetrics ? ?  ? ? ? ? ? ? ? ? ? ? ? ? ? ?  ?  ? ? ? ? ? ? ? ?Anesthesia Physical ?Anesthesia Plan ? ?ASA: 3 ? ?Anesthesia Plan: Spinal  ? ?Post-op Pain Management:   ? ?Induction: Intravenous ? ?PONV Risk Score and Plan: 3 and Treatment may vary due to age or medical condition, Propofol infusion and Ondansetron ? ?Airway Management Planned: Natural Airway and Simple Face Mask ? ?Additional Equipment: None ? ?Intra-op Plan:  ? ?Post-operative Plan:  ? ?Informed Consent: I have reviewed the patients History and Physical, chart, labs and discussed the procedure including the risks, benefits and alternatives for the proposed anesthesia with the patient or authorized representative who has indicated his/her understanding and acceptance.  ? ? ? ?Dental advisory given ? ?Plan Discussed  with: CRNA and Anesthesiologist ? ?Anesthesia Plan Comments: (Per cardiology, "Pre op CV risk assessment. Overall I feel her risk is low. Main risk is her age. She has no known CV disease. She has no active CV complaints. Her planned surgery is not high risk and will be done under spinal anesthesia. I don't feel further cardiac testing is negative")  ? ? ? ? ? ?Anesthesia Quick Evaluation ? ?

## 2021-09-09 NOTE — Care Plan (Signed)
Ortho Bundle Case Management Note ? ?Patient Details  ?Name: Heidi [name] ?MRN: [id number] ?Date of Birth: November 26, 1934 ? ?Met with patient in the office prior to surgery. She will discharge to home with family to assist. Has rolling walker, CPM ordered. OPPT set up with [facility] Albin Felling and MD in agreement with plan. CHoice offered                 ? ? ? ?DME Arranged:  CPM ?DME Agency:  [employer] ? ?HH Arranged:    ?[place] Agency:    ? ?Additional Comments: ?Please contact me with any questions of if this plan should need to change. ? ?Mardelle Matte  [facility] Orthopaedic Specialist  [phone] ?09/09/2021, 9:22 AM ?  ?

## 2021-09-17 ENCOUNTER — Encounter (HOSPITAL_COMMUNITY)
Admission: RE | Disposition: A | Discharge status: Home / Self Care | Payer: Self-pay | Source: Home / Self Care | Attending: Orthopedic Surgery

## 2021-09-17 ENCOUNTER — Observation Stay (HOSPITAL_COMMUNITY)
Admission: RE | Admit: 2021-09-17 | Discharge: 2021-09-18 | Disposition: A | Discharge status: Home / Self Care | Payer: Medicare Other | Attending: Orthopedic Surgery | Admitting: Orthopedic Surgery

## 2021-09-17 ENCOUNTER — Other Ambulatory Visit: Payer: Self-pay

## 2021-09-17 ENCOUNTER — Ambulatory Visit (HOSPITAL_COMMUNITY): Payer: Medicare Other | Admitting: Physician Assistant

## 2021-09-17 ENCOUNTER — Observation Stay (HOSPITAL_COMMUNITY): Payer: Medicare Other

## 2021-09-17 ENCOUNTER — Ambulatory Visit (HOSPITAL_BASED_OUTPATIENT_CLINIC_OR_DEPARTMENT_OTHER): Payer: Medicare Other | Admitting: Certified Registered"

## 2021-09-17 ENCOUNTER — Encounter (HOSPITAL_COMMUNITY): Payer: Self-pay | Admitting: Orthopedic Surgery

## 2021-09-17 DIAGNOSIS — I10 Essential (primary) hypertension: Secondary | ICD-10-CM | POA: Diagnosis not present

## 2021-09-17 DIAGNOSIS — Z471 Aftercare following joint replacement surgery: Secondary | ICD-10-CM | POA: Diagnosis not present

## 2021-09-17 DIAGNOSIS — Z96652 Presence of left artificial knee joint: Secondary | ICD-10-CM

## 2021-09-17 DIAGNOSIS — M1712 Unilateral primary osteoarthritis, left knee: Secondary | ICD-10-CM | POA: Diagnosis not present

## 2021-09-17 DIAGNOSIS — Z87891 Personal history of nicotine dependence: Secondary | ICD-10-CM

## 2021-09-17 DIAGNOSIS — K759 Inflammatory liver disease, unspecified: Secondary | ICD-10-CM | POA: Diagnosis not present

## 2021-09-17 DIAGNOSIS — G8918 Other acute postprocedural pain: Secondary | ICD-10-CM | POA: Diagnosis not present

## 2021-09-17 DIAGNOSIS — E785 Hyperlipidemia, unspecified: Secondary | ICD-10-CM | POA: Diagnosis not present

## 2021-09-17 DIAGNOSIS — Z85828 Personal history of other malignant neoplasm of skin: Secondary | ICD-10-CM | POA: Insufficient documentation

## 2021-09-17 DIAGNOSIS — Z01818 Encounter for other preprocedural examination: Secondary | ICD-10-CM

## 2021-09-17 DIAGNOSIS — Z79899 Other long term (current) drug therapy: Secondary | ICD-10-CM | POA: Insufficient documentation

## 2021-09-17 DIAGNOSIS — Z85038 Personal history of other malignant neoplasm of large intestine: Secondary | ICD-10-CM | POA: Diagnosis not present

## 2021-09-17 HISTORY — PX: TOTAL KNEE ARTHROPLASTY: SHX125

## 2021-09-17 SURGERY — ARTHROPLASTY, KNEE, TOTAL
Anesthesia: Spinal | Site: Knee | Laterality: Left

## 2021-09-17 MED ORDER — PROPOFOL 10 MG/ML IV BOLUS
INTRAVENOUS | Status: DC | PRN
  Administered 2021-09-17 (×2): 10 mg via INTRAVENOUS

## 2021-09-17 MED ORDER — METHOCARBAMOL 500 MG PO TABS: 500.0000 mg | ORAL_TABLET | Freq: Four times a day (QID) | ORAL | Status: DC | PRN

## 2021-09-17 MED ORDER — URSODIOL 300 MG PO CAPS: 600.0000 mg | ORAL_CAPSULE | ORAL | Status: DC

## 2021-09-17 MED ORDER — LIDOCAINE 2% (20 MG/ML) 5 ML SYRINGE
INTRAMUSCULAR | Status: DC | PRN
  Administered 2021-09-17: 60 mg via INTRAVENOUS

## 2021-09-17 MED ORDER — VANCOMYCIN HCL IN DEXTROSE 1-5 GM/200ML-% IV SOLN
1000.0000 mg | INTRAVENOUS | Status: AC
  Administered 2021-09-17: 1000 mg via INTRAVENOUS
  Filled 2021-09-17: qty 200

## 2021-09-17 MED ORDER — EPHEDRINE 5 MG/ML INJ
INTRAVENOUS | Status: AC
  Filled 2021-09-17: qty 5

## 2021-09-17 MED ORDER — DIPHENHYDRAMINE HCL 12.5 MG/5ML PO ELIX: 12.5000 mg | ORAL_SOLUTION | ORAL | Status: DC | PRN

## 2021-09-17 MED ORDER — CEFAZOLIN SODIUM 1 G IJ SOLR
INTRAMUSCULAR | Status: AC
  Filled 2021-09-17: qty 20

## 2021-09-17 MED ORDER — ONDANSETRON HCL 4 MG PO TABS: 4.0000 mg | ORAL_TABLET | Freq: Four times a day (QID) | ORAL | Status: DC | PRN

## 2021-09-17 MED ORDER — BUPIVACAINE IN DEXTROSE 0.75-8.25 % IT SOLN
INTRATHECAL | Status: DC | PRN
  Administered 2021-09-17: 1.6 mL via INTRATHECAL

## 2021-09-17 MED ORDER — DOCUSATE SODIUM 100 MG PO CAPS
100.0000 mg | ORAL_CAPSULE | Freq: Two times a day (BID) | ORAL | Status: DC
  Administered 2021-09-17 – 2021-09-18 (×2): 100 mg via ORAL
  Filled 2021-09-17 (×2): qty 1

## 2021-09-17 MED ORDER — VANCOMYCIN HCL IN DEXTROSE 1-5 GM/200ML-% IV SOLN
1000.0000 mg | Freq: Two times a day (BID) | INTRAVENOUS | Status: AC
  Administered 2021-09-17: 1000 mg via INTRAVENOUS
  Filled 2021-09-17: qty 200

## 2021-09-17 MED ORDER — ONDANSETRON HCL 4 MG/2ML IJ SOLN: 4.0000 mg | Freq: Four times a day (QID) | INTRAMUSCULAR | Status: DC | PRN

## 2021-09-17 MED ORDER — PANTOPRAZOLE SODIUM 40 MG PO TBEC
40.0000 mg | DELAYED_RELEASE_TABLET | Freq: Every day | ORAL | Status: DC
  Administered 2021-09-18: 40 mg via ORAL
  Filled 2021-09-17: qty 1

## 2021-09-17 MED ORDER — TRANEXAMIC ACID-NACL 1000-0.7 MG/100ML-% IV SOLN
1000.0000 mg | INTRAVENOUS | Status: AC
  Administered 2021-09-17: 1000 mg via INTRAVENOUS
  Filled 2021-09-17: qty 100

## 2021-09-17 MED ORDER — DEXAMETHASONE SODIUM PHOSPHATE 10 MG/ML IJ SOLN
10.0000 mg | Freq: Once | INTRAMUSCULAR | Status: AC
  Administered 2021-09-18: 10 mg via INTRAVENOUS
  Filled 2021-09-17: qty 1

## 2021-09-17 MED ORDER — TRAMADOL HCL 50 MG PO TABS
50.0000 mg | ORAL_TABLET | Freq: Four times a day (QID) | ORAL | Status: DC
  Administered 2021-09-17 – 2021-09-18 (×5): 50 mg via ORAL
  Filled 2021-09-17 (×5): qty 1

## 2021-09-17 MED ORDER — POVIDONE-IODINE 10 % EX SWAB: 2.0000 "application " | Freq: Once | CUTANEOUS | Status: DC

## 2021-09-17 MED ORDER — OXYCODONE HCL 5 MG PO TABS: 5.0000 mg | ORAL_TABLET | ORAL | Status: DC | PRN

## 2021-09-17 MED ORDER — URSODIOL 300 MG PO CAPS
300.0000 mg | ORAL_CAPSULE | Freq: Every evening | ORAL | Status: DC
Start: 2021-09-17 — End: 2021-09-18
  Filled 2021-09-17 (×2): qty 1

## 2021-09-17 MED ORDER — HYDROMORPHONE HCL 1 MG/ML IJ SOLN
0.5000 mg | INTRAMUSCULAR | Status: DC | PRN
  Administered 2021-09-17 (×2): 1 mg via INTRAVENOUS
  Filled 2021-09-17 (×2): qty 1

## 2021-09-17 MED ORDER — LACTATED RINGERS IV SOLN: INTRAVENOUS | Status: DC

## 2021-09-17 MED ORDER — POLYETHYLENE GLYCOL 3350 17 G PO PACK: 17.0000 g | PACK | Freq: Every day | ORAL | Status: DC | PRN

## 2021-09-17 MED ORDER — DEXAMETHASONE SODIUM PHOSPHATE 10 MG/ML IJ SOLN
8.0000 mg | Freq: Once | INTRAMUSCULAR | Status: AC
  Administered 2021-09-17: 5 mg via INTRAVENOUS

## 2021-09-17 MED ORDER — BUPIVACAINE LIPOSOME 1.3 % IJ SUSP
INTRAMUSCULAR | Status: DC | PRN
Start: 2021-09-17 — End: 2021-09-17
  Administered 2021-09-17: 20 mL

## 2021-09-17 MED ORDER — PROPOFOL 1000 MG/100ML IV EMUL
INTRAVENOUS | Status: AC
  Filled 2021-09-17: qty 100

## 2021-09-17 MED ORDER — MIDAZOLAM HCL 2 MG/2ML IJ SOLN
1.0000 mg | INTRAMUSCULAR | Status: DC
  Filled 2021-09-17: qty 2

## 2021-09-17 MED ORDER — MESALAMINE 1.2 G PO TBEC
2.4000 g | DELAYED_RELEASE_TABLET | Freq: Every day | ORAL | Status: DC
  Administered 2021-09-18: 2.4 g via ORAL
  Filled 2021-09-17: qty 2

## 2021-09-17 MED ORDER — METHOCARBAMOL 500 MG IVPB - SIMPLE MED: 500.0000 mg | Freq: Four times a day (QID) | INTRAVENOUS | Status: DC | PRN

## 2021-09-17 MED ORDER — ALUM & MAG HYDROXIDE-SIMETH 200-200-20 MG/5ML PO SUSP: 30.0000 mL | ORAL | Status: DC | PRN

## 2021-09-17 MED ORDER — METOCLOPRAMIDE HCL 5 MG PO TABS: 5.0000 mg | ORAL_TABLET | Freq: Three times a day (TID) | ORAL | Status: DC | PRN

## 2021-09-17 MED ORDER — BUPIVACAINE LIPOSOME 1.3 % IJ SUSP
INTRAMUSCULAR | Status: AC
  Filled 2021-09-17: qty 20

## 2021-09-17 MED ORDER — BUPIVACAINE LIPOSOME 1.3 % IJ SUSP: 20.0000 mL | Freq: Once | INTRAMUSCULAR | Status: DC

## 2021-09-17 MED ORDER — ACETAMINOPHEN 325 MG PO TABS: 325.0000 mg | ORAL_TABLET | Freq: Four times a day (QID) | ORAL | Status: DC | PRN

## 2021-09-17 MED ORDER — SODIUM CHLORIDE 0.9% FLUSH
INTRAVENOUS | Status: DC | PRN
Start: 2021-09-17 — End: 2021-09-17
  Administered 2021-09-17: 30 mL

## 2021-09-17 MED ORDER — VITAMIN B-12 1000 MCG PO TABS
1000.0000 ug | ORAL_TABLET | Freq: Every day | ORAL | Status: DC
  Administered 2021-09-18: 1000 ug via ORAL
  Filled 2021-09-17: qty 1

## 2021-09-17 MED ORDER — ROPIVACAINE HCL 7.5 MG/ML IJ SOLN
INTRAMUSCULAR | Status: DC | PRN
  Administered 2021-09-17: 20 mL via PERINEURAL

## 2021-09-17 MED ORDER — BUPIVACAINE-EPINEPHRINE (PF) 0.25% -1:200000 IJ SOLN
INTRAMUSCULAR | Status: AC
  Filled 2021-09-17: qty 30

## 2021-09-17 MED ORDER — URSODIOL 300 MG PO CAPS
300.0000 mg | ORAL_CAPSULE | ORAL | Status: DC
  Administered 2021-09-18: 300 mg via ORAL
  Filled 2021-09-17: qty 1

## 2021-09-17 MED ORDER — SODIUM CHLORIDE (PF) 0.9 % IJ SOLN
INTRAMUSCULAR | Status: AC
  Filled 2021-09-17: qty 30

## 2021-09-17 MED ORDER — ACETAMINOPHEN 500 MG PO TABS
1000.0000 mg | ORAL_TABLET | Freq: Four times a day (QID) | ORAL | Status: AC
  Administered 2021-09-17 – 2021-09-18 (×4): 1000 mg via ORAL
  Filled 2021-09-17 (×4): qty 2

## 2021-09-17 MED ORDER — CHLORHEXIDINE GLUCONATE 0.12 % MT SOLN
15.0000 mL | Freq: Once | OROMUCOSAL | Status: AC
  Administered 2021-09-17: 15 mL via OROMUCOSAL

## 2021-09-17 MED ORDER — ONDANSETRON HCL 4 MG/2ML IJ SOLN
INTRAMUSCULAR | Status: DC | PRN
  Administered 2021-09-17: 4 mg via INTRAVENOUS

## 2021-09-17 MED ORDER — VITAMIN D 25 MCG (1000 UNIT) PO TABS
1000.0000 [IU] | ORAL_TABLET | Freq: Every day | ORAL | Status: DC
  Administered 2021-09-18: 1000 [IU] via ORAL
  Filled 2021-09-17: qty 1

## 2021-09-17 MED ORDER — OXYCODONE HCL 5 MG PO TABS
10.0000 mg | ORAL_TABLET | ORAL | Status: DC | PRN
  Administered 2021-09-17 – 2021-09-18 (×4): 10 mg via ORAL
  Filled 2021-09-17 (×5): qty 2

## 2021-09-17 MED ORDER — OXYCODONE HCL 5 MG/5ML PO SOLN: 5.0000 mg | Freq: Once | ORAL | Status: DC | PRN

## 2021-09-17 MED ORDER — PROPOFOL 500 MG/50ML IV EMUL
INTRAVENOUS | Status: DC | PRN
  Administered 2021-09-17: 40 ug/kg/min via INTRAVENOUS

## 2021-09-17 MED ORDER — SODIUM CHLORIDE 0.9 % IR SOLN
Status: DC | PRN
  Administered 2021-09-17: 1000 mL

## 2021-09-17 MED ORDER — TRANEXAMIC ACID-NACL 1000-0.7 MG/100ML-% IV SOLN
1000.0000 mg | Freq: Once | INTRAVENOUS | Status: AC
  Administered 2021-09-17: 1000 mg via INTRAVENOUS
  Filled 2021-09-17: qty 100

## 2021-09-17 MED ORDER — EPHEDRINE SULFATE-NACL 50-0.9 MG/10ML-% IV SOSY
PREFILLED_SYRINGE | INTRAVENOUS | Status: DC | PRN
  Administered 2021-09-17: 5 mg via INTRAVENOUS

## 2021-09-17 MED ORDER — URSODIOL 300 MG PO CAPS
600.0000 mg | ORAL_CAPSULE | Freq: Every evening | ORAL | Status: DC
  Administered 2021-09-17: 300 mg via ORAL
  Filled 2021-09-17: qty 2

## 2021-09-17 MED ORDER — IRBESARTAN 150 MG PO TABS
300.0000 mg | ORAL_TABLET | Freq: Every evening | ORAL | Status: DC
  Administered 2021-09-17: 300 mg via ORAL
  Filled 2021-09-17: qty 2

## 2021-09-17 MED ORDER — PHENOL 1.4 % MT LIQD: 1.0000 | OROMUCOSAL | Status: DC | PRN

## 2021-09-17 MED ORDER — ORAL CARE MOUTH RINSE: 15.0000 mL | Freq: Once | OROMUCOSAL | Status: AC

## 2021-09-17 MED ORDER — BISACODYL 10 MG RE SUPP
10.0000 mg | Freq: Every day | RECTAL | Status: DC | PRN
Start: 2021-09-17 — End: 2021-09-18

## 2021-09-17 MED ORDER — FLUTICASONE PROPIONATE 50 MCG/ACT NA SUSP: 1.0000 | Freq: Every day | NASAL | Status: DC | PRN

## 2021-09-17 MED ORDER — ACETAMINOPHEN 500 MG PO TABS
1000.0000 mg | ORAL_TABLET | Freq: Once | ORAL | Status: AC
  Administered 2021-09-17: 1000 mg via ORAL
  Filled 2021-09-17: qty 2

## 2021-09-17 MED ORDER — OXYCODONE HCL 5 MG PO TABS: 5.0000 mg | ORAL_TABLET | Freq: Once | ORAL | Status: DC | PRN

## 2021-09-17 MED ORDER — BUPIVACAINE-EPINEPHRINE 0.25% -1:200000 IJ SOLN
INTRAMUSCULAR | Status: DC | PRN
  Administered 2021-09-17: 30 mL

## 2021-09-17 MED ORDER — ONDANSETRON HCL 4 MG/2ML IJ SOLN: 4.0000 mg | Freq: Once | INTRAMUSCULAR | Status: DC | PRN

## 2021-09-17 MED ORDER — HYDROMORPHONE HCL 1 MG/ML IJ SOLN: 0.2500 mg | INTRAMUSCULAR | Status: DC | PRN

## 2021-09-17 MED ORDER — WATER FOR IRRIGATION, STERILE IR SOLN
Status: DC | PRN
Start: 2021-09-17 — End: 2021-09-17
  Administered 2021-09-17: 2000 mL

## 2021-09-17 MED ORDER — 0.9 % SODIUM CHLORIDE (POUR BTL) OPTIME
TOPICAL | Status: DC | PRN
  Administered 2021-09-17: 1000 mL

## 2021-09-17 MED ORDER — FENTANYL CITRATE PF 50 MCG/ML IJ SOSY
50.0000 ug | PREFILLED_SYRINGE | INTRAMUSCULAR | Status: DC
  Administered 2021-09-17: 50 ug via INTRAVENOUS
  Filled 2021-09-17: qty 2

## 2021-09-17 MED ORDER — MENTHOL 3 MG MT LOZG: 1.0000 | LOZENGE | OROMUCOSAL | Status: DC | PRN

## 2021-09-17 MED ORDER — METOCLOPRAMIDE HCL 5 MG/ML IJ SOLN: 5.0000 mg | Freq: Three times a day (TID) | INTRAMUSCULAR | Status: DC | PRN

## 2021-09-17 MED ORDER — CEFAZOLIN SODIUM-DEXTROSE 2-3 GM-%(50ML) IV SOLR
INTRAVENOUS | Status: DC | PRN
  Administered 2021-09-17: 2 g via INTRAVENOUS

## 2021-09-17 MED ORDER — ASPIRIN 81 MG PO CHEW
81.0000 mg | CHEWABLE_TABLET | Freq: Two times a day (BID) | ORAL | Status: DC
  Administered 2021-09-17 – 2021-09-18 (×2): 81 mg via ORAL
  Filled 2021-09-17 (×2): qty 1

## 2021-09-17 SURGICAL SUPPLY — 54 items
BAG COUNTER SPONGE SURGICOUNT (BAG) IMPLANT
BLADE HEX COATED 2.75 (ELECTRODE) ×2 IMPLANT
BLADE SAG 18X100X1.27 (BLADE) ×2 IMPLANT
BLADE SAGITTAL 25.0X1.37X90 (BLADE) ×2 IMPLANT
BLADE SURG 15 STRL LF DISP TIS (BLADE) ×1 IMPLANT
BLADE SURG 15 STRL SS (BLADE) ×2
BLADE SURG SZ10 CARB STEEL (BLADE) IMPLANT
BNDG ELASTIC 6X10 VLCR STRL LF (GAUZE/BANDAGES/DRESSINGS) ×2 IMPLANT
BNDG ELASTIC 6X15 VLCR STRL LF (GAUZE/BANDAGES/DRESSINGS) ×1 IMPLANT
BOWL SMART MIX CTS (DISPOSABLE) IMPLANT
CLSR STERI-STRIP ANTIMIC 1/2X4 (GAUZE/BANDAGES/DRESSINGS) ×3 IMPLANT
COMP FEMORAL TRIATHLON SZ3 (Joint) ×2 IMPLANT
COMPONENT FEMRL TRIATHLON SZ3 (Joint) IMPLANT
COVER SURGICAL LIGHT HANDLE (MISCELLANEOUS) ×2 IMPLANT
CUFF TOURN SGL QUICK 34 (TOURNIQUET CUFF) ×2
CUFF TRNQT CYL 34X4.125X (TOURNIQUET CUFF) ×1 IMPLANT
DRAPE U-SHAPE 47X51 STRL (DRAPES) ×2 IMPLANT
DRSG MEPILEX BORDER 4X12 (GAUZE/BANDAGES/DRESSINGS) ×2 IMPLANT
DURAPREP 26ML APPLICATOR (WOUND CARE) ×4 IMPLANT
GLOVE BIO SURGEON STRL SZ7.5 (GLOVE) ×4 IMPLANT
GLOVE BIOGEL PI IND STRL 7.5 (GLOVE) ×1 IMPLANT
GLOVE BIOGEL PI IND STRL 8 (GLOVE) ×1 IMPLANT
GLOVE BIOGEL PI INDICATOR 7.5 (GLOVE) ×1
GLOVE BIOGEL PI INDICATOR 8 (GLOVE) ×1
GLOVE SURG SYN 7.5  E (GLOVE) ×2
GLOVE SURG SYN 7.5 E (GLOVE) ×1 IMPLANT
GLOVE SURG SYN 7.5 PF PI (GLOVE) ×1 IMPLANT
GOWN SPEC L4 XLG W/TWL (GOWN DISPOSABLE) ×2 IMPLANT
GOWN STRL REUS W/ TWL LRG LVL3 (GOWN DISPOSABLE) ×1 IMPLANT
GOWN STRL REUS W/TWL LRG LVL3 (GOWN DISPOSABLE) ×2
HANDPIECE INTERPULSE COAX TIP (DISPOSABLE) ×2
HOLDER FOLEY CATH W/STRAP (MISCELLANEOUS) IMPLANT
IMMOBILIZER KNEE 20 (SOFTGOODS) ×2
IMMOBILIZER KNEE 20 THIGH 36 (SOFTGOODS) IMPLANT
IMMOBILIZER KNEE 22 UNIV (SOFTGOODS) ×2 IMPLANT
INSERT TIB BEARING SZ3 11 (Miscellaneous) ×1 IMPLANT
KNEE PATELLA ASYMMETRIC 9X29 (Knees) ×1 IMPLANT
KNEE TIBIAL COMPONENT SZ3 (Knees) ×1 IMPLANT
MANIFOLD NEPTUNE II (INSTRUMENTS) ×2 IMPLANT
NS IRRIG 1000ML POUR BTL (IV SOLUTION) ×2 IMPLANT
PACK TOTAL KNEE CUSTOM (KITS) ×2 IMPLANT
PIN FLUTED HEDLESS FIX 3.5X1/8 (PIN) ×1 IMPLANT
PROTECTOR NERVE ULNAR (MISCELLANEOUS) ×2 IMPLANT
SET HNDPC FAN SPRY TIP SCT (DISPOSABLE) ×1 IMPLANT
SPIKE FLUID TRANSFER (MISCELLANEOUS) ×2 IMPLANT
SUT MNCRL AB 3-0 PS2 18 (SUTURE) ×2 IMPLANT
SUT VIC AB 0 CT1 36 (SUTURE) ×2 IMPLANT
SUT VIC AB 1 CT1 36 (SUTURE) ×4 IMPLANT
SUT VIC AB 2-0 CT1 27 (SUTURE) ×2
SUT VIC AB 2-0 CT1 TAPERPNT 27 (SUTURE) ×1 IMPLANT
TRAY FOLEY MTR SLVR 14FR STAT (SET/KITS/TRAYS/PACK) IMPLANT
TRAY FOLEY MTR SLVR 16FR STAT (SET/KITS/TRAYS/PACK) IMPLANT
TUBE SUCTION HIGH CAP CLEAR NV (SUCTIONS) ×2 IMPLANT
WRAP KNEE MAXI GEL POST OP (GAUZE/BANDAGES/DRESSINGS) ×2 IMPLANT

## 2021-09-17 NOTE — Plan of Care (Signed)
?  Problem: Activity: ?Goal: Risk for activity intolerance will decrease ?Outcome: Progressing ?  ?Problem: Nutrition: ?Goal: Adequate nutrition will be maintained ?Outcome: Progressing ?  ?Problem: Pain Managment: ?Goal: General experience of comfort will improve ?Outcome: Progressing ?  ?Problem: Safety: ?Goal: Ability to remain free from injury will improve ?Outcome: Progressing ?  ?

## 2021-09-17 NOTE — Anesthesia Procedure Notes (Signed)
Anesthesia Regional Block: Adductor canal block  ? ?Pre-Anesthetic Checklist: , timeout performed,  Correct Patient, Correct Site, Correct Laterality,  Correct Procedure, Correct Position, site marked,  Risks and benefits discussed,  Surgical consent,  Pre-op evaluation,  At surgeon's request and post-op pain management ? ?Laterality: Left ? ?Prep: chloraprep     ?  ?Needles:  ?Injection technique: Single-shot ? ?Needle Type: Echogenic Stimulator Needle   ? ? ?Needle Length: 10cm  ?Needle Gauge: 21  ? ?Needle insertion depth: 6 cm ? ? ?Additional Needles: ? ? ?Procedures:,,,, ultrasound used (permanent image in chart),,    ?Narrative:  ?Start time: 09/17/2021 9:22 AM ?End time: 09/17/2021 9:27 AM ?Injection made incrementally with aspirations every 5 mL. ? ?Performed by: Personally  ?Anesthesiologist: Josephine Igo, MD ? ?Additional Notes: ?Timeout performed. Patient sedated. Relevant anatomy ID'd using [place]. Incremental 2-33m injection of LA with frequent aspiration. Patient tolerated procedure well. ? ? ? ?Left Adductor Canal Block ? ?

## 2021-09-17 NOTE — Transfer of Care (Signed)
Immediate Anesthesia Transfer of Care Note ? ?Patient: Heidi [name] ? ?Procedure(s) Performed: TOTAL KNEE ARTHROPLASTY (Left: Knee) ? ?Patient Location: PACU ? ?Anesthesia Type:Spinal ? ?Level of Consciousness: awake, drowsy and patient cooperative ? ?Airway & Oxygen Therapy: Patient Spontanous Breathing and Patient connected to face mask oxygen ? ?Post-op Assessment: Report given to RN and Post -op Vital signs reviewed and stable ? ?Post vital signs: Reviewed and stable ? ?Last Vitals:  ?Vitals Value Taken Time  ?BP 109/57 09/17/21 1149  ?Temp    ?Pulse    ?Resp 15 09/17/21 1151  ?SpO2    ?Vitals shown include unvalidated device data. ? ?Last Pain:  ?Vitals:  ? 09/17/21 0931  ?TempSrc:   ?PainSc: 0-No pain  ?   ? ?  ? ?Complications: No notable events documented. ?

## 2021-09-17 NOTE — Discharge Instructions (Signed)

## 2021-09-17 NOTE — Progress Notes (Signed)
Orthopedic Tech Progress Note ?Patient Details:  ?Heidi [name] ?1934/05/07 ?[id number] ? ?CPM Left Knee ?CPM Left Knee: Off ?Left Knee Flexion (Degrees): 60 ?Left Knee Extension (Degrees): 0 ? ?Post Interventions ?Patient Tolerated: Well ?Instructions Provided: Adjustment of device ? ?Vernona Rieger ?09/17/2021, 6:12 PM ? ?

## 2021-09-17 NOTE — Evaluation (Signed)
Physical Therapy Evaluation ?Patient Details ?Name: Heidi [name] ?MRN: [id number] ?DOB: 1934/08/04 ?Today's Date: 09/17/2021 ? ?History of Present Illness ? 87 yo female s/p L TTKA. PMH: L distal radius fx withORIF, R patella fx  ?Clinical Impression ? Pt is s/p TKA resulting in the deficits listed below (see PT Problem List).  ?Pt amb 52' with RW and min-min/guard assist. Returned to bed on request of ortho tech as pt has orders to start CPM today. TKA HEP initiated. Anticipate steady progress ? Pt will benefit from skilled PT to increase their independence and safety with mobility to allow discharge to the venue listed below.  ?   ?   ? ?Recommendations for follow up therapy are one component of a multi-disciplinary discharge planning process, led by the attending physician.  Recommendations may be updated based on patient status, additional functional criteria and insurance authorization. ? ?Follow Up Recommendations Follow physician's recommendations for discharge plan and follow up therapies ? ?  ?Assistance Recommended at Discharge    ?Patient can return home with the following ? Help with stairs or ramp for entrance;Assistance with cooking/housework;Assist for transportation;A little help with bathing/dressing/bathroom ? ?  ?Equipment Recommendations None recommended by PT  ?Recommendations for Other Services ?    ?  ?Functional Status Assessment Patient has had a recent decline in their functional status and demonstrates the ability to make significant improvements in function in a reasonable and predictable amount of time.  ? ?  ?Precautions / Restrictions Precautions ?Precautions: Fall;Knee ?Restrictions ?Weight Bearing Restrictions: No ?Other Position/Activity Restrictions: WBAT  ? ?  ? ?Mobility ? Bed Mobility ?Overal bed mobility: Needs Assistance ?Bed Mobility: Supine to Sit ?  ?  ?Supine to sit: Min guard ?  ?  ?General bed mobility comments: incr time and effort, cues for sequence ?   ? ?Transfers ?Overall transfer level: Needs assistance ?Equipment used: Rolling walker (2 wheels) ?Transfers: Sit to/from Stand ?Sit to Stand: Min assist ?  ?  ?  ?  ?  ?General transfer comment: cues for hand placement and LLE position ?  ? ?Ambulation/Gait ?Ambulation/Gait assistance: Min assist, Min guard ?Gait Distance (Feet): 50 Feet ?Assistive device: Rolling walker (2 wheels) ?Gait Pattern/deviations: Step-to pattern, Decreased stance time - left ?Gait velocity: decr ?  ?  ?General Gait Details: verbal cues for sequence, posture, shoulder depression. steady gait with RW, no LOB, no knee buckling noted ? ?Stairs ?  ?  ?  ?  ?  ? ?Wheelchair Mobility ?  ? ?Modified Rankin (Stroke Patients Only) ?  ? ?  ? ?Balance   ?  ?  ?  ?  ?  ?  ?  ?  ?  ?  ?  ?  ?  ?  ?  ?  ?  ?  ?   ? ? ? ?Pertinent Vitals/Pain Pain Assessment ?Pain Assessment: Faces ?Faces Pain Scale: Hurts even more ?Pain Location: L knee and lower leg/ankle ?Pain Descriptors / Indicators: Grimacing, Sore ?Pain Intervention(s): Limited activity within patient's tolerance, Monitored during session, Premedicated before session, Repositioned  ? ? ?Home Living Family/patient expects to be discharged to:: Private residence ?Living Arrangements: Children ?Available Help at Discharge: Family;Available 24 hours/day ?Type of Home: House ?Home Access: Stairs to enter ?  ?Entrance Stairs-Number of Steps: 1 ?  ?Home Layout: One level ?Home Equipment: Conservation officer, nature (2 wheels);Hand held shower head;Grab bars - tub/shower;Cane - single point;BSC/3in1 ?   ?  ?Prior Function Prior Level of Function : Independent/Modified  Independent ?  ?  ?  ?  ?  ?  ?  ?  ?  ? ? ?Hand Dominance  ?   ? ?  ?Extremity/Trunk Assessment  ? Upper Extremity Assessment ?Upper Extremity Assessment: Overall WFL for tasks assessed ?  ? ?Lower Extremity Assessment ?Lower Extremity Assessment: LLE deficits/detail ?LLE Deficits / Details: ankle WFL, knee extension and hip flexion 3/5 grossly, knee  flexion ~ 10 to 65 degrees AROM ?  ? ?   ?Communication  ? Communication: No difficulties  ?Cognition Arousal/Alertness: Awake/alert ?Behavior During Therapy: [facility] for tasks assessed/performed ?Overall Cognitive Status: Within Functional Limits for tasks assessed ?  ?  ?  ?  ?  ?  ?  ?  ?  ?  ?  ?  ?  ?  ?  ?  ?  ?  ?  ? ?  ?General Comments   ? ?  ?Exercises Total Joint Exercises ?Ankle Circles/Pumps: AROM, Both, 5 reps ?Heel Slides: AAROM, 5 reps, Left  ? ?Assessment/Plan  ?  ?PT Assessment Patient needs continued PT services  ?PT Problem List Decreased strength;Decreased range of motion;Decreased activity tolerance;Decreased mobility;Decreased balance;Pain ? ?   ?  ?PT Treatment Interventions DME instruction;Therapeutic exercise;Gait training;Functional mobility training;Therapeutic activities;Patient/family education;Stair training   ? ?PT Goals (Current goals can be found in the Care Plan section)  ?Acute Rehab PT Goals ?Patient Stated Goal: less pain ?PT Goal Formulation: With patient ?Time For Goal Achievement: 09/24/21 ?Potential to Achieve Goals: Good ? ?  ?Frequency 7X/week ?  ? ? ?Co-evaluation   ?  ?  ?  ?  ? ? ?  ?AM-PAC PT "6 Clicks" Mobility  ?Outcome Measure Help needed turning from your back to your side while in a flat bed without using bedrails?: A Little ?Help needed moving from lying on your back to sitting on the side of a flat bed without using bedrails?: A Little ?Help needed moving to and from a bed to a chair (including a wheelchair)?: A Little ?Help needed standing up from a chair using your arms (e.g., wheelchair or bedside chair)?: A Little ?Help needed to walk in hospital room?: A Little ?Help needed climbing 3-5 steps with a railing? : A Little ?6 Click Score: 18 ? ?  ?End of Session Equipment Utilized During Treatment: Gait belt;Left knee immobilizer ?Activity Tolerance: Patient tolerated treatment well ?Patient left: with call bell/phone within reach;in bed;with bed alarm set ?  ?PT  Visit Diagnosis: Difficulty in walking, not elsewhere classified (R26.2) ?  ? ?Time: 2993-7169 ?PT Time Calculation (min) (ACUTE ONLY): 21 min ? ? ?Charges:   PT Evaluation ?$PT Eval Low Complexity: 1 Low ?  ?  ?   ? ? ?Baxter Flattery, PT ? ?Acute Rehab Dept [facility]) [phone] ?Pager [phone] ? ?09/17/2021 ? ? ?Charli Halle ?09/17/2021, 4:01 PM ? ?

## 2021-09-17 NOTE — Op Note (Signed)
DATE OF SURGERY:  09/17/2021 ?TIME: 11:09 AM ? ?PATIENT NAME:  Heidi [name]   ?AGE: 87 y.o.  ? ? ?PRE-OPERATIVE DIAGNOSIS:  OA LEFT KNEE ? ?POST-OPERATIVE DIAGNOSIS:  Same ? ?PROCEDURE:  Procedure(s): ?TOTAL KNEE ARTHROPLASTY ? ? ?SURGEON:  Renette Butters, MD  ? ?ASSISTANTAggie Moats, PA-C, he was present and scrubbed throughout the case, critical for completion in a timely fashion, and for retraction, instrumentation, and closure. ? ? ? ?OPERATIVE IMPLANTS: [employer] Triathlon CR. Press fit knee  Femur size 3, Tibia size 3, Patella size 29 3-peg oval button, with a 11 mm polyethylene insert. ? ? ?PREOPERATIVE INDICATIONS: ? ?OCTAVIA VELADOR is a 87 y.o. year old female with end stage bone on bone degenerative arthritis of the knee who failed conservative treatment, including injections, antiinflammatories, activity modification, and assistive devices, and had significant impairment of their activities of daily living, and elected for Total Knee Arthroplasty.  ? ?The risks, benefits, and alternatives were discussed at length including but not limited to the risks of infection, bleeding, nerve injury, stiffness, blood clots, the need for revision surgery, cardiopulmonary complications, among others, and they were willing to proceed. ? ? ?OPERATIVE DESCRIPTION: ? ?The patient was brought to the operative room and placed in a supine position.  General anesthesia was administered.  IV antibiotics were given.  The lower extremity was prepped and draped in the usual sterile fashion.  Time out was performed.  The leg was elevated and exsanguinated and the tourniquet was inflated. ? ?Anterior approach was performed.  The patella was everted and osteophytes were removed.  The anterior horn of the medial and lateral meniscus was removed.  ? ?The distal femur was opened with the drill and the intramedullary distal femoral cutting jig was utilized, set at 5 degrees resecting 8 mm off the distal femur.  Care was taken to  protect the collateral ligaments. ? ?The distal femoral sizing jig was applied, taking care to avoid notching.  Then the 4-in-1 cutting jig was applied and the anterior and posterior femur was cut, along with the chamfer cuts.  All posterior osteophytes were removed.  The flexion gap was then measured and was symmetric with the extension gap. ? ?Then the extramedullary tibial cutting jig was utilized making the appropriate cut using the anterior tibial crest as a reference building in appropriate posterior slope.  Care was taken during the cut to protect the medial and collateral ligaments.  The proximal tibia was removed along with the posterior horns of the menisci.  The PCL was sacrificed.   ? ?The extensor gap was measured and was approximately 46m.   ? ?I completed the distal femoral preparation using the appropriate jig to prepare the box. ? ?The patella was then measured, and cut with the saw.   ? ?The proximal tibia sized and prepared accordingly with the reamer and the punch, and then all components were trialed with the above sized poly insert.  The knee was found to have excellent balance and full motion.   ? ?The above named components were then impacted into place and Poly tibial piece and patella were inserted. ? ?I was very happy with his stability and ROM ? ?I performed a periarticular injection with marcaine and toradol ? ?The knee was easily taken through a range of motion and the patella tracked well and the knee irrigated copiously and the parapatellar and subcutaneous tissue closed with vicryl, and monocryl with steri strips for the skin.  The  incision was dressed with sterile gauze and the tourniquet released and the patient was awakened and returned to the PACU in stable and satisfactory condition.  There were no complications. ? ?Total tourniquet time was roughly 60 minutes. ? ? ?POSTOPERATIVE PLAN: post op Abx, DVT px: SCD's, TED's, Early ambulation and chemical px ? ? ? ?

## 2021-09-17 NOTE — Anesthesia Procedure Notes (Signed)
Procedure Name: [place] ?Date/Time: 09/17/2021 9:53 AM ?Performed by: Eben Burow, CRNA ?Pre-anesthesia Checklist: Patient identified, Emergency Drugs available, Suction available, Patient being monitored and Timeout performed ?Oxygen Delivery Method: Simple face mask ?Placement Confirmation: positive ETCO2 ? ? ? ? ?

## 2021-09-17 NOTE — Anesthesia Postprocedure Evaluation (Signed)
Anesthesia Post Note ? ?Patient: Heidi [name] ? ?Procedure(s) Performed: TOTAL KNEE ARTHROPLASTY (Left: Knee) ? ?  ? ?Patient location during evaluation: PACU ?Anesthesia Type: Spinal ?Level of consciousness: oriented and awake and alert ?Pain management: pain level controlled ?Vital Signs Assessment: post-procedure vital signs reviewed and stable ?Respiratory status: spontaneous breathing, respiratory function stable and nonlabored ventilation ?Cardiovascular status: blood pressure returned to baseline and stable ?Postop Assessment: no headache, no backache, no apparent nausea or vomiting, spinal receding and patient able to bend at knees ?Anesthetic complications: no ? ? ?No notable events documented. ? ?Last Vitals:  ?Vitals:  ? 09/17/21 1245 09/17/21 1310  ?BP: (!) 144/63 (!) 141/75  ?Pulse: (!) 58 (!) 58  ?Resp: 12 16  ?Temp: 36.4 ?C 36.7 ?C  ?SpO2: 98% 98%  ?  ?Last Pain:  ?Vitals:  ? 09/17/21 1310  ?TempSrc:   ?PainSc: 0-No pain  ? ? ?LLE Motor Response: Purposeful movement (09/17/21 1328) ?LLE Sensation: Numbness (09/17/21 1328) ?RLE Motor Response: Purposeful movement (09/17/21 1328) ?RLE Sensation: Numbness (09/17/21 1328) ?  ?  ? ?Kendyn Zaman A. ? ? ? ? ?

## 2021-09-17 NOTE — Progress Notes (Signed)
Orthopedic Tech Progress Note ?Patient Details:  ?Heidi [name] ?[birth year] ?[id number] ? ?CPM Left Knee ?CPM Left Knee: On ?Left Knee Flexion (Degrees): 60 ?Left Knee Extension (Degrees): 0 ? ?Post Interventions ?Patient Tolerated: Well ?Instructions Provided: Adjustment of device ?PA contacted me via secure chat and requested that the patient begin using the CPM today, rather than starting tomorrow. ?[place] ?09/17/2021, 4:13 PM ? ?

## 2021-09-17 NOTE — Interval H&P Note (Signed)
History and Physical Interval Note: ? ?09/17/2021 ?8:46 AM ? ?Heidi [name]  has presented today for surgery, with the diagnosis of OA LEFT KNEE.  The various methods of treatment have been discussed with the patient and family. After consideration of risks, benefits and other options for treatment, the patient has consented to  Procedure(s): ?TOTAL KNEE ARTHROPLASTY (Left) as a surgical intervention.  The patient's history has been reviewed, patient examined, no change in status, stable for surgery.  I have reviewed the patient's chart and labs.  Questions were answered to the patient's satisfaction.   ? ? ?Renette Butters ? ? ?

## 2021-09-17 NOTE — Progress Notes (Signed)
Orthopedic Tech Progress Note ?Patient Details:  ?Heidi [name] ?October 06, 1934 ?[id number] ? ?Ortho Devices ?Type of Ortho Device: Bone foam zero knee ?Ortho Device/Splint Location: LLE ?Ortho Device/Splint Interventions: Ordered, Application, Adjustment ?  ?Post Interventions ?Patient Tolerated: Well ?Instructions Provided: Care of device, Adjustment of device ?Bone foam applied and CPM delivered to patients room. She is to be placed in the CPM tomorrow morning. ?[place] ?09/17/2021, 1:48 PM ? ?

## 2021-09-17 NOTE — Anesthesia Procedure Notes (Signed)
Spinal ? ?Patient location during procedure: OR ?Start time: 09/17/2021 9:58 AM ?End time: 09/17/2021 10:05 AM ?Reason for block: surgical anesthesia ?Staffing ?Performed: anesthesiologist and resident/CRNA  ?Anesthesiologist: Josephine Igo, MD ?Resident/CRNA: Eben Burow, CRNA ?Preanesthetic Checklist ?Completed: patient identified, IV checked, site marked, risks and benefits discussed, surgical consent, monitors and equipment checked, pre-op evaluation and timeout performed ?Spinal Block ?Patient position: sitting ?Prep: DuraPrep and site prepped and draped ?Patient monitoring: heart rate, cardiac monitor, continuous pulse ox and blood pressure ?Approach: midline ?Location: L3-4 ?Injection technique: single-shot ?Needle ?Needle type: Pencan  ?Needle gauge: 24 G ?Needle length: 9 cm ?Needle insertion depth: 7 cm ?Assessment ?Sensory level: T6 ?Events: CSF return and second provider ?Additional Notes ?Patient tolerated procedure well. Adequate sensory level. Technically difficult due to levoscoliosis. ? ? ? ? ? ?

## 2021-09-17 NOTE — Progress Notes (Signed)
AssistedDr. Royce Macadamia with left, adductor canal, ultrasound guided block. Side rails up, monitors on throughout procedure. See vital signs in flow sheet. Tolerated Procedure well. ? ?

## 2021-09-18 DIAGNOSIS — I10 Essential (primary) hypertension: Secondary | ICD-10-CM | POA: Diagnosis not present

## 2021-09-18 DIAGNOSIS — Z87891 Personal history of nicotine dependence: Secondary | ICD-10-CM | POA: Diagnosis not present

## 2021-09-18 DIAGNOSIS — M1712 Unilateral primary osteoarthritis, left knee: Secondary | ICD-10-CM | POA: Diagnosis not present

## 2021-09-18 DIAGNOSIS — Z85828 Personal history of other malignant neoplasm of skin: Secondary | ICD-10-CM | POA: Diagnosis not present

## 2021-09-18 DIAGNOSIS — Z85038 Personal history of other malignant neoplasm of large intestine: Secondary | ICD-10-CM | POA: Diagnosis not present

## 2021-09-18 DIAGNOSIS — Z79899 Other long term (current) drug therapy: Secondary | ICD-10-CM | POA: Diagnosis not present

## 2021-09-18 MED ORDER — DOCUSATE SODIUM 100 MG PO CAPS: 100.0000 mg | ORAL_CAPSULE | Freq: Two times a day (BID) | ORAL | 0 refills | Status: DC | PRN

## 2021-09-18 MED ORDER — OXYCODONE HCL 5 MG PO TABS: 2.5000 mg | ORAL_TABLET | Freq: Four times a day (QID) | ORAL | 0 refills | Status: DC | PRN

## 2021-09-18 MED ORDER — ASPIRIN EC 81 MG PO TBEC: 81.0000 mg | DELAYED_RELEASE_TABLET | Freq: Two times a day (BID) | ORAL | 0 refills | Status: DC

## 2021-09-18 MED ORDER — METHOCARBAMOL 750 MG PO TABS: 750.0000 mg | ORAL_TABLET | Freq: Three times a day (TID) | ORAL | 0 refills | Status: DC | PRN

## 2021-09-18 MED ORDER — ONDANSETRON 4 MG PO TBDP: 4.0000 mg | ORAL_TABLET | Freq: Two times a day (BID) | ORAL | 0 refills | Status: AC | PRN

## 2021-09-18 NOTE — Discharge Summary (Signed)
Physician Discharge Summary  Patient ID: Heidi [name] MRN: [id number] DOB/AGE: 85-Jun-1936 85 y.o.  Admit date: 09/17/2021 Discharge date: 09/18/2021  Admission Diagnoses: left knee OA  Discharge Diagnoses:  Principal Problem:   S/P total knee arthroplasty, left   Discharged Condition: fair  Hospital Course: Patient underwent a left total knee arthroplasty by Dr. Percell Miller on 9/93/57 without complications. She spent the night in observation for pain control and mobilization. She has passed her PT evaluations and is ready for discharge home with OPPT already set up.   Consults: None  Significant Diagnostic Studies: n/a  Treatments: IV hydration, antibiotics: Ancef, analgesia: acetaminophen, Dilaudid, Morphine, Tramadol, and Oxycodone, anticoagulation: ASA, therapies: PT and OT, and surgery: left TKA  Discharge Exam: Blood pressure 128/72, pulse (!) 57, temperature (!) 97.4 F (36.3 C), temperature source Oral, resp. rate 17, height 5' 2"  (1.575 m), weight 58 kg, SpO2 100 %. General appearance: alert, cooperative, and no distress Head: Normocephalic, without obvious abnormality, atraumatic Resp: clear to auscultation bilaterally Cardio: regular rate and rhythm, S1, S2 normal, no murmur, click, rub or gallop GI: soft, non-tender; bowel sounds normal; no masses,  no organomegaly Extremities: extremities normal, atraumatic, no cyanosis or edema Pulses:  L brachial 2+ R brachial 2+  L radial 2+ R radial 2+  L inguinal 2+ R inguinal 2+  L popliteal 2+ R popliteal 2+  L posterior tibial 2+ R posterior tibial 2+  L dorsalis pedis 2+ R dorsalis pedis 2+   Neurologic: Grossly normal Incision/Wound: c/d/i  Disposition: Discharge disposition: 01-Home or Self Care       Discharge Instructions     CPM   Complete by: As directed    Continuous passive motion machine (CPM):      Use the CPM from 0 to 90 degrees for 6 hours per day.      You may break it up into 2 or 3 sessions per  day.      Use CPM for 3 weeks or until you are told to stop.   Call MD / Call 911   Complete by: As directed    If you experience chest pain or shortness of breath, CALL 911 and be transported to the hospital emergency room.  If you develope a fever above 101 F, pus (white drainage) or increased drainage or redness at the wound, or calf pain, call your surgeon's office.   Diet - low sodium heart healthy   Complete by: As directed    Discharge instructions   Complete by: As directed    You may bear weight as tolerated. Keep your dressing on and dry until follow up. Take medicine to prevent blood clots as directed. Take pain medicine as needed with the goal of transitioning to over the counter medicines.    INSTRUCTIONS AFTER JOINT REPLACEMENT   Remove items at home which could result in a fall. This includes throw rugs or furniture in walking pathways ICE to the affected joint every three hours while awake for 30 minutes at a time, for at least the first 3-5 days, and then as needed for pain and swelling.  Continue to use ice for pain and swelling. You may notice swelling that will progress down to the foot and ankle.  This is normal after surgery.  Elevate your leg when you are not up walking on it.   Continue to use the breathing machine you got in the hospital (incentive spirometer) which will help keep your temperature down.  It is  common for your temperature to cycle up and down following surgery, especially at night when you are not up moving around and exerting yourself.  The breathing machine keeps your lungs expanded and your temperature down.   DIET:  As you were doing prior to hospitalization, we recommend a well-balanced diet.  DRESSING / WOUND CARE / SHOWERING  You may shower 3 days after surgery, but keep the wounds dry during showering.  You may use an occlusive plastic wrap (Press'n Seal for example) with blue painter's tape at edges, NO SOAKING/SUBMERGING IN THE BATHTUB.  If  the bandage gets wet, call the office.   ACTIVITY  Increase activity slowly as tolerated, but follow the weight bearing instructions below.   No driving for 6 weeks or until further direction given by your physician.  You cannot drive while taking narcotics.  No lifting or carrying greater than 10 lbs. until further directed by your surgeon. Avoid periods of inactivity such as sitting longer than an hour when not asleep. This helps prevent blood clots.  You may return to work once you are authorized by your doctor.    WEIGHT BEARING   Weight bearing as tolerated with assist device (walker, cane, etc) as directed, use it as long as suggested by your surgeon or therapist, typically at least 4-6 weeks.   EXERCISES  Results after joint replacement surgery are often greatly improved when you follow the exercise, range of motion and muscle strengthening exercises prescribed by your doctor. Safety measures are also important to protect the joint from further injury. Any time any of these exercises cause you to have increased pain or swelling, decrease what you are doing until you are comfortable again and then slowly increase them. If you have problems or questions, call your caregiver or physical therapist for advice.   Rehabilitation is important following a joint replacement. After just a few days of immobilization, the muscles of the leg can become weakened and shrink (atrophy).  These exercises are designed to build up the tone and strength of the thigh and leg muscles and to improve motion. Often times heat used for twenty to thirty minutes before working out will loosen up your tissues and help with improving the range of motion but do not use heat for the first two weeks following surgery (sometimes heat can increase post-operative swelling).   These exercises can be done on a training (exercise) mat, on the floor, on a table or on a bed. Use whatever works the best and is most comfortable for  you.    Use music or television while you are exercising so that the exercises are a pleasant break in your day. This will make your life better with the exercises acting as a break in your routine that you can look forward to.   Perform all exercises about fifteen times, three times per day or as directed.  You should exercise both the operative leg and the other leg as well.  Exercises include:   Quad Sets - Tighten up the muscle on the front of the thigh (Quad) and hold for 5-10 seconds.   Straight Leg Raises - With your knee straight (if you were given a brace, keep it on), lift the leg to 60 degrees, hold for 3 seconds, and slowly lower the leg.  Perform this exercise against resistance later as your leg gets stronger.  Leg Slides: Lying on your back, slowly slide your foot toward your buttocks, bending your knee up off the  floor (only go as far as is comfortable). Then slowly slide your foot back down until your leg is flat on the floor again.  Angel Wings: Lying on your back spread your legs to the side as far apart as you can without causing discomfort.  Hamstring Strength:  Lying on your back, push your heel against the floor with your leg straight by tightening up the muscles of your buttocks.  Repeat, but this time bend your knee to a comfortable angle, and push your heel against the floor.  You may put a pillow under the heel to make it more comfortable if necessary.   A rehabilitation program following joint replacement surgery can speed recovery and prevent re-injury in the future due to weakened muscles. Contact your doctor or a physical therapist for more information on knee rehabilitation.    CONSTIPATION  Constipation is defined medically as fewer than three stools per week and severe constipation as less than one stool per week.  Even if you have a regular bowel pattern at home, your normal regimen is likely to be disrupted due to multiple reasons following surgery.  Combination of  anesthesia, postoperative narcotics, change in appetite and fluid intake all can affect your bowels.   YOU MUST use at least one of the following options; they are listed in order of increasing strength to get the job done.  They are all available over the counter, and you may need to use some, POSSIBLY even all of these options:    Drink plenty of fluids (prune juice may be helpful) and high fiber foods Colace 100 mg by mouth twice a day  Senokot for constipation as directed and as needed Dulcolax (bisacodyl), take with full glass of water  Miralax (polyethylene glycol) once or twice a day as needed.  If you have tried all these things and are unable to have a bowel movement in the first 3-4 days after surgery call either your surgeon or your primary doctor.    If you experience loose stools or diarrhea, hold the medications until you stool forms back up.  If your symptoms do not get better within 1 week or if they get worse, check with your doctor.  If you experience "the worst abdominal pain ever" or develop nausea or vomiting, please contact the office immediately for further recommendations for treatment.   ITCHING:  If you experience itching with your medications, try taking only a single pain pill, or even half a pain pill at a time.  You can also use Benadryl over the counter for itching or also to help with sleep.   TED HOSE STOCKINGS:  Use stockings on both legs until for at least 2 weeks or as directed by physician office. They may be removed at night for sleeping.  MEDICATIONS:  See your medication summary on the "After Visit Summary" that nursing will review with you.  You may have some home medications which will be placed on hold until you complete the course of blood thinner medication.  It is important for you to complete the blood thinner medication as prescribed.  Take medicines as prescribed.   You have several different medicines that work in different ways. - Tylenol is for  mild to moderate pain. Try to take this medicine before turning to your narcotic medicines. - Robaxin is for muscle spasms. This medicine can make you drowsy. - Oxycodone is a narcotic pain medicine.  Take this for severe pain. This medicine can be dehydrating /  constipating. - Zofran is for nausea and vomiting. - Colace is for constipation prevention.   - Aspirin is to prevent blood clots after surgery.   PRECAUTIONS:  If you experience chest pain or shortness of breath - call 911 immediately for transfer to the hospital emergency department.   If you develop a fever greater that 101 F, purulent drainage from wound, increased redness or drainage from wound, foul odor from the wound/dressing, or calf pain - CONTACT YOUR SURGEON.                                                   FOLLOW-UP APPOINTMENTS:  If you do not already have a post-op appointment, please call the office [phone] for an appointment to be seen by Dr. Percell Miller in 2 weeks.   OTHER INSTRUCTIONS:   MAKE SURE YOU:  Understand these instructions.  Get help right away if you are not doing well or get worse.    Thank you for letting us be a part of your medical care team.  It is a privilege we respect greatly.  We hope these instructions will help you stay on track for a fast and full recovery!   Do not put a pillow under the knee. Place it under the heel.   Complete by: As directed    Driving restrictions   Complete by: As directed    No driving for 2 weeks   Post-operative opioid taper instructions:   Complete by: As directed    POST-OPERATIVE OPIOID TAPER INSTRUCTIONS: It is important to wean off of your opioid medication as soon as possible. If you do not need pain medication after your surgery it is ok to stop day one. Opioids include: Codeine, Hydrocodone(Norco, Vicodin), Oxycodone(Percocet, oxycontin) and hydromorphone amongst others.  Long term and even short term use of opiods can cause: Increased pain  response Dependence Constipation Depression Respiratory depression And more.  Withdrawal symptoms can include Flu like symptoms Nausea, vomiting And more Techniques to manage these symptoms Hydrate well Eat regular healthy meals Stay active Use relaxation techniques(deep breathing, meditating, yoga) Do Not substitute Alcohol to help with tapering If you have been on opioids for less than two weeks and do not have pain than it is ok to stop all together.  Plan to wean off of opioids This plan should start within one week post op of your joint replacement. Maintain the same interval or time between taking each dose and first decrease the dose.  Cut the total daily intake of opioids by one tablet each day Next start to increase the time between doses. The last dose that should be eliminated is the evening dose.      TED hose   Complete by: As directed    Use stockings (TED hose) for 2 weeks on left leg(s).  You may remove them at night for sleeping.   Weight bearing as tolerated   Complete by: As directed       Allergies as of 09/18/2021       Reactions   Codeine Nausea And Vomiting   Penicillins Hives        Medication List     TAKE these medications    ALLERGY RELIEF PO Take 1 tablet by mouth daily.   aspirin EC 81 MG tablet Take 1 tablet (81 mg total) by mouth  2 (two) times daily. For DVT prophylaxis for 30 days after surgery.   cholecalciferol 25 MCG (1000 UNIT) tablet Commonly known as: VITAMIN D3 Take 1,000 Units by mouth daily.   docusate sodium 100 MG capsule Commonly known as: Colace Take 1 capsule (100 mg total) by mouth 2 (two) times daily as needed for mild constipation or moderate constipation.   ezetimibe 10 MG tablet Commonly known as: ZETIA Take 5 mg by mouth 2 (two) times a week. Wednesday and Sunday.   fluticasone 50 MCG/ACT nasal spray Commonly known as: FLONASE Place 1 spray into both nostrils daily as needed for allergies.    irbesartan 300 MG tablet Commonly known as: AVAPRO Take 300 mg by mouth every evening.   mesalamine 1.2 g EC tablet Commonly known as: LIALDA Take 2 tablets (2.4 g total) by mouth daily with breakfast.   methocarbamol 750 MG tablet Commonly known as: Robaxin-750 Take 1 tablet (750 mg total) by mouth every 8 (eight) hours as needed for muscle spasms.   omeprazole 20 MG capsule Commonly known as: PRILOSEC TAKE ONE CAPSULE EACH DAY What changed: See the new instructions.   ondansetron 4 MG disintegrating tablet Commonly known as: ZOFRAN-ODT Take 1 tablet (4 mg total) by mouth 2 (two) times daily as needed for nausea or vomiting.   oxyCODONE 5 MG immediate release tablet Commonly known as: Roxicodone Take 0.5-1 tablets (2.5-5 mg total) by mouth every 6 (six) hours as needed for severe pain. Do not take more than 6 tablets in a 24 hour period.   Pitavastatin Calcium 4 MG Tabs Take 2 mg by mouth 2 (two) times a week. Wednesday and Sunday   PRESERVISION AREDS PO Take 1 tablet by mouth 2 (two) times daily.   TYLENOL ARTHRITIS PAIN PO Take 1,300 mg by mouth 2 (two) times daily.   ursodiol 300 MG capsule Commonly known as: ACTIGALL TAKE 2 CAPSULES EVERY OTHER DAY AND TAKE 3 CAPSULES EVERY OTHER DAY What changed:  how much to take how to take this when to take this additional instructions   Vitamin B 12 500 MCG Tabs Take 500 mcg by mouth daily. What changed: how much to take               Discharge Care Instructions  (From admission, onward)           Start     Ordered   09/18/21 0000  Weight bearing as tolerated        05 /17/23 1256            Follow-up Information     Renette Butters, MD. Go on 10/02/2021.   Specialty: Orthopedic Surgery Why: Your appointment is scheduled for 4:15. Contact information: [address] [place] [address] [phone]         [employer], [place]. Go on 09/19/2021.    Why: Your outpatient physical therapy is scheduled for 10:15. Please arrive at 10:00 to complete your paperwork Contact information: [facility] [place] [address] [phone]                 Signed: Alisa Graff 09/18/2021, 5:02 PM

## 2021-09-18 NOTE — TOC Transition Note (Signed)
Transition of Care (TOC) - CM/SW Discharge Note ? ?Patient Details  ?Name: Nobie M Linsley ?MRN: [id number] ?Date of Birth: 10/29/1934 ? ?Transition of Care (TOC) CM/SW Contact:  ? S , LCSW ?Phone Number: ?09/18/2021, 10:32 AM ? ?Clinical Narrative: Patient is expected to discharge home after working with PT. CSW met with patient to confirm discharge plan. Patient will go home with OPPT at [facility] Patient has a rolling walker at home and [employer] set up a CPM prior to surgery, so there are no additional DME needs at this time. TOC signing off. ? ?Final next level of care: OP Rehab ?Barriers to Discharge: No Barriers Identified ? ?Patient Goals and CMS Choice ?Patient states their goals for this hospitalization and ongoing recovery are:: Discharge home with OPPT at [facility] ?Choice offered to / list presented to : NA ? ?Discharge Plan and Services        ?DME Arranged: N/A ?DME Agency: NA ? ?Readmission Risk Interventions ?   ? View : No data to display.  ?  ?  ?  ? ?

## 2021-09-18 NOTE — Plan of Care (Signed)
  Problem: Education: Goal: Knowledge of General Education information will improve Description: Including pain rating scale, medication(s)/side effects and non-pharmacologic comfort measures Outcome: Progressing   Problem: Activity: Goal: Risk for activity intolerance will decrease Outcome: Progressing   Problem: Pain Managment: Goal: General experience of comfort will improve Outcome: Progressing   

## 2021-09-18 NOTE — Progress Notes (Signed)
? ? ?  Subjective: ?Patient reports pain as mild.  Tolerating diet.  Urinating.   No CP, SOB.  Has worked well with PT on mobilization OOB. Ready to go home.  ? ?Objective:  ? ?VITALS:   ?Vitals:  ? 09/17/21 1715 09/17/21 2101 09/18/21 0143 09/18/21 0524  ?BP: (!) 145/79 131/68 124/69 128/72  ?Pulse: 63 (!) 56 (!) 58 (!) 57  ?Resp:  17 17 17   ?Temp:  (!) 97.5 ?F (36.4 ?C) 97.8 ?F (36.6 ?C) (!) 97.4 ?F (36.3 ?C)  ?TempSrc:  Oral Oral Oral  ?SpO2:  100% 100% 100%  ?Weight:      ?Height:      ? ? ?  Latest Ref Rng & Units 08/21/2021  ? 10:43 AM 12/18/2020  ?  6:42 AM 12/18/2020  ?  6:22 AM  ?CBC  ?WBC 4.0 - 10.5 K/uL 5.3      ?Hemoglobin 12.0 - 15.0 g/dL 13.7   14.3   14.3    ?Hematocrit 36.0 - 46.0 % 41.2   42.0   42.0    ?Platelets 150.0 - 400.0 K/uL 211.0      ? ? ?  Latest Ref Rng & Units 08/21/2021  ? 10:43 AM 12/18/2020  ?  6:42 AM 12/18/2020  ?  6:22 AM  ?BMP  ?Glucose 70 - 99 mg/dL 95   87   93    ?BUN 6 - 23 mg/dL 22   19   25     ?Creatinine 0.40 - 1.20 mg/dL 0.78   0.70   0.60    ?Sodium 135 - 145 mEq/L 140   140   138    ?Potassium 3.5 - 5.1 mEq/L 4.2   4.3   7.3    ?Chloride 96 - 112 mEq/L 106   111   109    ?CO2 19 - 32 mEq/L 26      ?Calcium 8.4 - 10.5 mg/dL 8.7      ? ?Intake/Output   ?   05/16 0701 ?05/17 0700 05/17 0701 ?05/18 0700  ? P.O. 700   ? I.V. (mL/kg) 1144.2 (19.7)   ? IV Piggyback 783.4   ? Total Intake(mL/kg) 2627.6 (45.3)   ? Urine (mL/kg/hr) 1400   ? Blood 50   ? Total Output 1450   ? Net +1177.6   ?     ?  ? ? ?Physical Exam: ?General: NAD.  Sitting up in bedside chair. Calm, comfortable ?Resp: No increased wob ?Cardio: regular rate and rhythm ?ABD soft ?Neurologically intact ?MSK ?Neurovascularly intact ?Sensation intact distally ?Intact pulses distally ?Dorsiflexion/Plantar flexion intact ?Incision: dressing C/D/I ?Ice to knee ? ? ?Assessment: ?1 Day Post-Op  ?S/P Procedure(s) (LRB): ?TOTAL KNEE ARTHROPLASTY (Left) ?by Dr. Ernesta Amble. [facility] on 09/17/21 ? ?Principal Problem: ?  S/P total knee  arthroplasty, left ? ? ?Plan:  ?Advance diet ?Up with therapy ?Incentive Spirometry ?Elevate and Apply ice ? ?Weightbearing: WBAT LLE ?Insicional and dressing care: Dressings left intact until follow-up and Reinforce dressings as needed ?Orthopedic device(s):  CPM ?Showering: Keep dressing dry ?VTE prophylaxis: Aspirin 34m BID  x 30 days , SCDs, ambulation ?Pain control: continue current regimen ?Follow - up plan: 2 weeks ?Contact information:  TEdmonia LynchMD, MAggie MoatsPA-C ? ?Dispo: Home today  ? ? ? ? ?MBritt Bottom PA-C ?Office [phone]?09/18/2021, 8:57 AM  ?

## 2021-09-18 NOTE — Progress Notes (Signed)
Physical Therapy Treatment ?Patient Details ?Name: Heidi [name] ?MRN: [id number] ?DOB: Nov 04, 1934 ?Today's Date: 09/18/2021 ? ? ?History of Present Illness 87 yo female s/p L TTKA. PMH: L distal radius fx with ORIF, R patella fx ? ?  ?PT Comments  ? ? Progressing well with mobility. Pain controlled with activity. Reviewed/practiced exercises, gait training, and stair step training. Daughter present during session. All education completed.    ?Recommendations for follow up therapy are one component of a multi-disciplinary discharge planning process, led by the attending physician.  Recommendations may be updated based on patient status, additional functional criteria and insurance authorization. ? ?Follow Up Recommendations ? Follow physician's recommendations for discharge plan and follow up therapies ?  ?  ?Assistance Recommended at Discharge PRN  ?Patient can return home with the following Help with stairs or ramp for entrance;Assistance with cooking/housework;Assist for transportation;A little help with bathing/dressing/bathroom ?  ?Equipment Recommendations ? None recommended by PT  ?  ?Recommendations for Other Services   ? ? ?  ?Precautions / Restrictions Precautions ?Precautions: Fall;Knee ?Restrictions ?Weight Bearing Restrictions: No ?Other Position/Activity Restrictions: WBAT  ?  ? ?Mobility ? Bed Mobility ?Overal bed mobility: Needs Assistance ?Bed Mobility: Supine to Sit ?  ?  ?Supine to sit: Supervision ?  ?  ?  ?  ? ?Transfers ?Overall transfer level: Needs assistance ?Equipment used: Rolling walker (2 wheels) ?Transfers: Sit to/from Stand ?Sit to Stand: Min assist ?  ?  ?  ?  ?  ?General transfer comment: Assist to rise, stead. Cues for safety, hand placement. Increased time. More difficulty and effort when she doesn't have armrests to use ?  ? ?Ambulation/Gait ?Ambulation/Gait assistance: Min guard ?Gait Distance (Feet): 100 Feet ?Assistive device: Rolling walker (2 wheels) ?Gait Pattern/deviations:  Step-through pattern, Decreased stride length ?  ?  ?  ?General Gait Details: Min guard for safety. Cues for posture/shoulder depression, RW proximity. No LOB, no knee buckling, no dizziness ? ? ?Stairs ?Stairs: Yes ?Stairs assistance: Min guard ?Stair Management: Step to pattern, With walker, Forwards ?Number of Stairs: 1 ?General stair comments: cues for safety, technique, sequence. min guard assist for safe negotiation of 1 curb height step. ? ? ?Wheelchair Mobility ?  ? ?Modified Rankin (Stroke Patients Only) ?  ? ? ?  ?Balance Overall balance assessment: Needs assistance ?  ?  ?  ?  ?Standing balance support: During functional activity, Reliant on assistive device for balance ?Standing balance-Leahy Scale: Fair ?  ?  ?  ?  ?  ?  ?  ?  ?  ?  ?  ?  ?  ? ?  ?Cognition Arousal/Alertness: Awake/alert ?Behavior During Therapy: [facility] for tasks assessed/performed ?Overall Cognitive Status: Within Functional Limits for tasks assessed ?  ?  ?  ?  ?  ?  ?  ?  ?  ?  ?  ?  ?  ?  ?  ?  ?General Comments: HOH ?  ?  ? ?  ?Exercises Total Joint Exercises ?Ankle Circles/Pumps: AROM, Both, 10 reps ?Quad Sets: AROM, Both, 10 reps ?Hip ABduction/ADduction: AROM, Left, 10 reps ?Straight Leg Raises: AROM, Left, 10 reps ?Knee Flexion: AROM, Left, 10 reps, Seated ?Goniometric ROM: ~5-90 degrees ? ?  ?General Comments   ?  ?  ? ?Pertinent Vitals/Pain Pain Assessment ?Pain Assessment: 0-10 ?Pain Score: 5  ?Pain Location: L knee ?Pain Descriptors / Indicators: Discomfort, Sore ?Pain Intervention(s): Monitored during session, Ice applied, Repositioned  ? ? ?Home Living   ?  ?  ?  ?  ?  ?  ?  ?  ?  ?   ?  ?  Prior Function    ?  ?  ?   ? ?PT Goals (current goals can now be found in the care plan section) Progress towards PT goals: Progressing toward goals ? ?  ?Frequency ? ? ? 7X/week ? ? ? ?  ?PT Plan Current plan remains appropriate  ? ? ?Co-evaluation   ?  ?  ?  ?  ? ?  ?AM-PAC PT "6 Clicks" Mobility   ?Outcome Measure ? Help needed turning  from your back to your side while in a flat bed without using bedrails?: A Little ?Help needed moving from lying on your back to sitting on the side of a flat bed without using bedrails?: A Little ?Help needed moving to and from a bed to a chair (including a wheelchair)?: A Little ?Help needed standing up from a chair using your arms (e.g., wheelchair or bedside chair)?: A Little ?Help needed to walk in hospital room?: A Little ?Help needed climbing 3-5 steps with a railing? : A Little ?6 Click Score: 18 ? ?  ?End of Session Equipment Utilized During Treatment: Gait belt ?Activity Tolerance: Patient tolerated treatment well ?Patient left: in chair;with call bell/phone within reach;with family/visitor present ?  ?PT Visit Diagnosis: Other abnormalities of gait and mobility (R26.89) ?  ? ? ?Time: 8921-1941 ?PT Time Calculation (min) (ACUTE ONLY): 35 min ? ?Charges:  $Gait Training: 8-22 mins ?$Therapeutic Exercise: 8-22 mins          ?          ? ? ? ? ?Doreatha Massed, PT ?Acute Rehabilitation  ?Office: [phone] ?Pager: [phone] ? ?  ? ?

## 2021-09-19 DIAGNOSIS — Z96652 Presence of left artificial knee joint: Secondary | ICD-10-CM | POA: Diagnosis not present

## 2021-09-19 DIAGNOSIS — M6281 Muscle weakness (generalized): Secondary | ICD-10-CM | POA: Diagnosis not present

## 2021-09-19 DIAGNOSIS — R269 Unspecified abnormalities of gait and mobility: Secondary | ICD-10-CM | POA: Diagnosis not present

## 2021-09-23 DIAGNOSIS — M6281 Muscle weakness (generalized): Secondary | ICD-10-CM | POA: Diagnosis not present

## 2021-09-23 DIAGNOSIS — Z96652 Presence of left artificial knee joint: Secondary | ICD-10-CM | POA: Diagnosis not present

## 2021-09-23 DIAGNOSIS — R269 Unspecified abnormalities of gait and mobility: Secondary | ICD-10-CM | POA: Diagnosis not present

## 2021-09-25 ENCOUNTER — Encounter (HOSPITAL_COMMUNITY): Payer: Self-pay | Admitting: Orthopedic Surgery

## 2021-09-26 DIAGNOSIS — Z96652 Presence of left artificial knee joint: Secondary | ICD-10-CM | POA: Diagnosis not present

## 2021-09-26 DIAGNOSIS — M6281 Muscle weakness (generalized): Secondary | ICD-10-CM | POA: Diagnosis not present

## 2021-09-26 DIAGNOSIS — R269 Unspecified abnormalities of gait and mobility: Secondary | ICD-10-CM | POA: Diagnosis not present

## 2021-10-01 DIAGNOSIS — Z96652 Presence of left artificial knee joint: Secondary | ICD-10-CM | POA: Diagnosis not present

## 2021-10-01 DIAGNOSIS — M6281 Muscle weakness (generalized): Secondary | ICD-10-CM | POA: Diagnosis not present

## 2021-10-01 DIAGNOSIS — R269 Unspecified abnormalities of gait and mobility: Secondary | ICD-10-CM | POA: Diagnosis not present

## 2021-10-02 DIAGNOSIS — M25562 Pain in left knee: Secondary | ICD-10-CM | POA: Diagnosis not present

## 2021-10-04 DIAGNOSIS — R269 Unspecified abnormalities of gait and mobility: Secondary | ICD-10-CM | POA: Diagnosis not present

## 2021-10-04 DIAGNOSIS — M6281 Muscle weakness (generalized): Secondary | ICD-10-CM | POA: Diagnosis not present

## 2021-10-04 DIAGNOSIS — Z96652 Presence of left artificial knee joint: Secondary | ICD-10-CM | POA: Diagnosis not present

## 2021-10-07 DIAGNOSIS — Z96652 Presence of left artificial knee joint: Secondary | ICD-10-CM | POA: Diagnosis not present

## 2021-10-07 DIAGNOSIS — M6281 Muscle weakness (generalized): Secondary | ICD-10-CM | POA: Diagnosis not present

## 2021-10-07 DIAGNOSIS — R269 Unspecified abnormalities of gait and mobility: Secondary | ICD-10-CM | POA: Diagnosis not present

## 2021-10-14 DIAGNOSIS — Z96652 Presence of left artificial knee joint: Secondary | ICD-10-CM | POA: Diagnosis not present

## 2021-10-14 DIAGNOSIS — R269 Unspecified abnormalities of gait and mobility: Secondary | ICD-10-CM | POA: Diagnosis not present

## 2021-10-14 DIAGNOSIS — M6281 Muscle weakness (generalized): Secondary | ICD-10-CM | POA: Diagnosis not present

## 2021-10-17 DIAGNOSIS — E785 Hyperlipidemia, unspecified: Secondary | ICD-10-CM | POA: Diagnosis not present

## 2021-10-17 DIAGNOSIS — R7989 Other specified abnormal findings of blood chemistry: Secondary | ICD-10-CM | POA: Diagnosis not present

## 2021-10-17 DIAGNOSIS — M810 Age-related osteoporosis without current pathological fracture: Secondary | ICD-10-CM | POA: Diagnosis not present

## 2021-10-17 DIAGNOSIS — E538 Deficiency of other specified B group vitamins: Secondary | ICD-10-CM | POA: Diagnosis not present

## 2021-10-17 DIAGNOSIS — Z96652 Presence of left artificial knee joint: Secondary | ICD-10-CM | POA: Diagnosis not present

## 2021-10-17 DIAGNOSIS — M6281 Muscle weakness (generalized): Secondary | ICD-10-CM | POA: Diagnosis not present

## 2021-10-17 DIAGNOSIS — I10 Essential (primary) hypertension: Secondary | ICD-10-CM | POA: Diagnosis not present

## 2021-10-17 DIAGNOSIS — R269 Unspecified abnormalities of gait and mobility: Secondary | ICD-10-CM | POA: Diagnosis not present

## 2021-10-22 DIAGNOSIS — R269 Unspecified abnormalities of gait and mobility: Secondary | ICD-10-CM | POA: Diagnosis not present

## 2021-10-22 DIAGNOSIS — M6281 Muscle weakness (generalized): Secondary | ICD-10-CM | POA: Diagnosis not present

## 2021-10-22 DIAGNOSIS — Z96652 Presence of left artificial knee joint: Secondary | ICD-10-CM | POA: Diagnosis not present

## 2021-10-25 DIAGNOSIS — Z1331 Encounter for screening for depression: Secondary | ICD-10-CM | POA: Diagnosis not present

## 2021-10-25 DIAGNOSIS — R269 Unspecified abnormalities of gait and mobility: Secondary | ICD-10-CM | POA: Diagnosis not present

## 2021-10-25 DIAGNOSIS — Z85038 Personal history of other malignant neoplasm of large intestine: Secondary | ICD-10-CM | POA: Diagnosis not present

## 2021-10-25 DIAGNOSIS — K743 Primary biliary cirrhosis: Secondary | ICD-10-CM | POA: Diagnosis not present

## 2021-10-25 DIAGNOSIS — Z96652 Presence of left artificial knee joint: Secondary | ICD-10-CM | POA: Diagnosis not present

## 2021-10-25 DIAGNOSIS — Z0000 Encounter for general adult medical examination without abnormal findings: Secondary | ICD-10-CM | POA: Diagnosis not present

## 2021-10-25 DIAGNOSIS — I10 Essential (primary) hypertension: Secondary | ICD-10-CM | POA: Diagnosis not present

## 2021-10-25 DIAGNOSIS — M1990 Unspecified osteoarthritis, unspecified site: Secondary | ICD-10-CM | POA: Diagnosis not present

## 2021-10-25 DIAGNOSIS — M810 Age-related osteoporosis without current pathological fracture: Secondary | ICD-10-CM | POA: Diagnosis not present

## 2021-10-25 DIAGNOSIS — M7122 Synovial cyst of popliteal space [Baker], left knee: Secondary | ICD-10-CM | POA: Diagnosis not present

## 2021-10-25 DIAGNOSIS — K449 Diaphragmatic hernia without obstruction or gangrene: Secondary | ICD-10-CM | POA: Diagnosis not present

## 2021-10-25 DIAGNOSIS — I872 Venous insufficiency (chronic) (peripheral): Secondary | ICD-10-CM | POA: Diagnosis not present

## 2021-10-25 DIAGNOSIS — M5416 Radiculopathy, lumbar region: Secondary | ICD-10-CM | POA: Diagnosis not present

## 2021-10-25 DIAGNOSIS — E785 Hyperlipidemia, unspecified: Secondary | ICD-10-CM | POA: Diagnosis not present

## 2021-10-25 DIAGNOSIS — M6281 Muscle weakness (generalized): Secondary | ICD-10-CM | POA: Diagnosis not present

## 2021-10-25 DIAGNOSIS — Z1339 Encounter for screening examination for other mental health and behavioral disorders: Secondary | ICD-10-CM | POA: Diagnosis not present

## 2021-10-25 DIAGNOSIS — C189 Malignant neoplasm of colon, unspecified: Secondary | ICD-10-CM | POA: Diagnosis not present

## 2021-10-28 ENCOUNTER — Other Ambulatory Visit: Payer: Self-pay | Admitting: Internal Medicine

## 2021-10-28 DIAGNOSIS — M5416 Radiculopathy, lumbar region: Secondary | ICD-10-CM

## 2021-10-29 ENCOUNTER — Other Ambulatory Visit (HOSPITAL_COMMUNITY): Payer: Self-pay | Admitting: *Deleted

## 2021-10-29 DIAGNOSIS — Z96652 Presence of left artificial knee joint: Secondary | ICD-10-CM | POA: Diagnosis not present

## 2021-10-29 DIAGNOSIS — R269 Unspecified abnormalities of gait and mobility: Secondary | ICD-10-CM | POA: Diagnosis not present

## 2021-10-29 DIAGNOSIS — M6281 Muscle weakness (generalized): Secondary | ICD-10-CM | POA: Diagnosis not present

## 2021-10-30 ENCOUNTER — Ambulatory Visit (HOSPITAL_COMMUNITY)
Admission: RE | Admit: 2021-10-30 | Discharge: 2021-10-30 | Disposition: A | Discharge status: Home / Self Care | Payer: Medicare Other | Source: Ambulatory Visit | Attending: Internal Medicine | Admitting: Internal Medicine

## 2021-10-30 DIAGNOSIS — M810 Age-related osteoporosis without current pathological fracture: Secondary | ICD-10-CM | POA: Diagnosis not present

## 2021-10-30 MED ORDER — DENOSUMAB 60 MG/ML ~~LOC~~ SOSY
60.0000 mg | PREFILLED_SYRINGE | Freq: Once | SUBCUTANEOUS | Status: AC
Start: 2021-10-30 — End: 2021-10-30

## 2021-10-30 MED ORDER — DENOSUMAB 60 MG/ML ~~LOC~~ SOSY
PREFILLED_SYRINGE | SUBCUTANEOUS | Status: AC
  Administered 2021-10-30: 60 mg via SUBCUTANEOUS
  Filled 2021-10-30: qty 1

## 2021-10-31 DIAGNOSIS — R269 Unspecified abnormalities of gait and mobility: Secondary | ICD-10-CM | POA: Diagnosis not present

## 2021-10-31 DIAGNOSIS — M6281 Muscle weakness (generalized): Secondary | ICD-10-CM | POA: Diagnosis not present

## 2021-10-31 DIAGNOSIS — Z96652 Presence of left artificial knee joint: Secondary | ICD-10-CM | POA: Diagnosis not present

## 2021-11-04 DIAGNOSIS — R269 Unspecified abnormalities of gait and mobility: Secondary | ICD-10-CM | POA: Diagnosis not present

## 2021-11-04 DIAGNOSIS — M6281 Muscle weakness (generalized): Secondary | ICD-10-CM | POA: Diagnosis not present

## 2021-11-04 DIAGNOSIS — Z96652 Presence of left artificial knee joint: Secondary | ICD-10-CM | POA: Diagnosis not present

## 2021-11-08 DIAGNOSIS — Z96652 Presence of left artificial knee joint: Secondary | ICD-10-CM | POA: Diagnosis not present

## 2021-11-08 DIAGNOSIS — R269 Unspecified abnormalities of gait and mobility: Secondary | ICD-10-CM | POA: Diagnosis not present

## 2021-11-08 DIAGNOSIS — M6281 Muscle weakness (generalized): Secondary | ICD-10-CM | POA: Diagnosis not present

## 2021-11-11 ENCOUNTER — Ambulatory Visit
Admission: RE | Admit: 2021-11-11 | Discharge: 2021-11-11 | Disposition: A | Discharge status: Home / Self Care | Payer: Medicare Other | Source: Ambulatory Visit | Attending: Internal Medicine | Admitting: Internal Medicine

## 2021-11-11 DIAGNOSIS — Z96652 Presence of left artificial knee joint: Secondary | ICD-10-CM | POA: Diagnosis not present

## 2021-11-11 DIAGNOSIS — M5416 Radiculopathy, lumbar region: Secondary | ICD-10-CM

## 2021-11-11 DIAGNOSIS — M6281 Muscle weakness (generalized): Secondary | ICD-10-CM | POA: Diagnosis not present

## 2021-11-11 DIAGNOSIS — M545 Low back pain, unspecified: Secondary | ICD-10-CM | POA: Diagnosis not present

## 2021-11-11 DIAGNOSIS — R269 Unspecified abnormalities of gait and mobility: Secondary | ICD-10-CM | POA: Diagnosis not present

## 2021-11-11 DIAGNOSIS — M48061 Spinal stenosis, lumbar region without neurogenic claudication: Secondary | ICD-10-CM | POA: Diagnosis not present

## 2021-11-12 DIAGNOSIS — R82998 Other abnormal findings in urine: Secondary | ICD-10-CM | POA: Diagnosis not present

## 2021-11-14 DIAGNOSIS — R269 Unspecified abnormalities of gait and mobility: Secondary | ICD-10-CM | POA: Diagnosis not present

## 2021-11-14 DIAGNOSIS — M6281 Muscle weakness (generalized): Secondary | ICD-10-CM | POA: Diagnosis not present

## 2021-11-14 DIAGNOSIS — Z96652 Presence of left artificial knee joint: Secondary | ICD-10-CM | POA: Diagnosis not present

## 2021-12-03 DIAGNOSIS — I10 Essential (primary) hypertension: Secondary | ICD-10-CM | POA: Diagnosis not present

## 2021-12-09 DIAGNOSIS — I10 Essential (primary) hypertension: Secondary | ICD-10-CM | POA: Diagnosis not present

## 2022-01-29 ENCOUNTER — Telehealth: Payer: Self-pay | Admitting: Gastroenterology

## 2022-01-29 MED ORDER — MESALAMINE 1.2 G PO TBEC: 2.4000 g | DELAYED_RELEASE_TABLET | Freq: Every day | ORAL | 1 refills | Status: DC

## 2022-01-29 NOTE — Telephone Encounter (Signed)
Inbound call from patient stating pharmacy is waiting on response from provider in regards to refilling patient prescription for Mesalamine. Please advise.  Thank you

## 2022-01-29 NOTE — Telephone Encounter (Signed)
Per last office note in 08-2021, continue mesalamine. Refill sent to pharmacy.

## 2022-02-12 DIAGNOSIS — M25561 Pain in right knee: Secondary | ICD-10-CM | POA: Diagnosis not present

## 2022-02-13 ENCOUNTER — Other Ambulatory Visit (HOSPITAL_COMMUNITY): Payer: Self-pay

## 2022-02-15 DIAGNOSIS — Z23 Encounter for immunization: Secondary | ICD-10-CM | POA: Diagnosis not present

## 2022-03-03 ENCOUNTER — Other Ambulatory Visit: Payer: Self-pay | Admitting: Gastroenterology

## 2022-03-24 DIAGNOSIS — H35373 Puckering of macula, bilateral: Secondary | ICD-10-CM | POA: Diagnosis not present

## 2022-04-03 ENCOUNTER — Encounter (INDEPENDENT_AMBULATORY_CARE_PROVIDER_SITE_OTHER): Payer: Medicare Other | Admitting: Ophthalmology

## 2022-04-03 DIAGNOSIS — I10 Essential (primary) hypertension: Secondary | ICD-10-CM | POA: Diagnosis not present

## 2022-04-03 DIAGNOSIS — H35033 Hypertensive retinopathy, bilateral: Secondary | ICD-10-CM

## 2022-04-03 DIAGNOSIS — H43813 Vitreous degeneration, bilateral: Secondary | ICD-10-CM

## 2022-04-03 DIAGNOSIS — H353132 Nonexudative age-related macular degeneration, bilateral, intermediate dry stage: Secondary | ICD-10-CM

## 2022-05-29 DIAGNOSIS — E785 Hyperlipidemia, unspecified: Secondary | ICD-10-CM | POA: Diagnosis not present

## 2022-05-29 DIAGNOSIS — K219 Gastro-esophageal reflux disease without esophagitis: Secondary | ICD-10-CM | POA: Diagnosis not present

## 2022-05-29 DIAGNOSIS — M810 Age-related osteoporosis without current pathological fracture: Secondary | ICD-10-CM | POA: Diagnosis not present

## 2022-05-29 DIAGNOSIS — K743 Primary biliary cirrhosis: Secondary | ICD-10-CM | POA: Diagnosis not present

## 2022-05-29 DIAGNOSIS — M7122 Synovial cyst of popliteal space [Baker], left knee: Secondary | ICD-10-CM | POA: Diagnosis not present

## 2022-05-29 DIAGNOSIS — R809 Proteinuria, unspecified: Secondary | ICD-10-CM | POA: Diagnosis not present

## 2022-05-29 DIAGNOSIS — I872 Venous insufficiency (chronic) (peripheral): Secondary | ICD-10-CM | POA: Diagnosis not present

## 2022-05-29 DIAGNOSIS — J309 Allergic rhinitis, unspecified: Secondary | ICD-10-CM | POA: Diagnosis not present

## 2022-05-29 DIAGNOSIS — I10 Essential (primary) hypertension: Secondary | ICD-10-CM | POA: Diagnosis not present

## 2022-05-29 DIAGNOSIS — I7300 Raynaud's syndrome without gangrene: Secondary | ICD-10-CM | POA: Diagnosis not present

## 2022-05-29 DIAGNOSIS — C189 Malignant neoplasm of colon, unspecified: Secondary | ICD-10-CM | POA: Diagnosis not present

## 2022-06-02 ENCOUNTER — Telehealth: Payer: Self-pay | Admitting: Gastroenterology

## 2022-06-02 NOTE — Telephone Encounter (Signed)
Inbound call from patient, states she has sent a request from [facility] to lower the cost of her medications. Patient wanted to advise nurse of these documents so she can proceed with getting her medication refilled. Please advise.

## 2022-06-02 NOTE — Telephone Encounter (Signed)
Noted. Thank you. Patient last rec'd prescription for #180 with 1 refill (6 month supply) in September 2023. - She should have refills through March.

## 2022-06-02 NOTE — Telephone Encounter (Signed)
Inbound call from [facility] states Mesalamine was approved for a year from today.

## 2022-06-03 ENCOUNTER — Telehealth: Payer: Self-pay | Admitting: Pharmacy Technician

## 2022-06-03 ENCOUNTER — Other Ambulatory Visit (HOSPITAL_COMMUNITY): Payer: Self-pay

## 2022-06-03 NOTE — Telephone Encounter (Signed)
A Tier Exception for URSODIOL has been requested by patient to patient plan [employer]. The plan DENIED the request for reason below.      Test billing results with [facility] returned a copay of $45.

## 2022-06-04 NOTE — Telephone Encounter (Signed)
Called and spoke to patient. She is aware of the copay and said it is higher than last year but she can manage the $45 a month.

## 2022-06-06 ENCOUNTER — Other Ambulatory Visit: Payer: Self-pay | Admitting: Gastroenterology

## 2022-06-09 DIAGNOSIS — M25561 Pain in right knee: Secondary | ICD-10-CM | POA: Diagnosis not present

## 2022-06-12 ENCOUNTER — Telehealth: Payer: Self-pay | Admitting: *Deleted

## 2022-06-12 NOTE — Telephone Encounter (Signed)
   Pre-operative Risk Assessment    Patient Name: Heidi [name]  DOB: 04/27/1935 MRN: [id number]      Request for Surgical Clearance    Procedure:   RT TOTAL KNEE REPLACEMENT  Date of Surgery:  Clearance TBD                                 Surgeon:  Edmonia Lynch, MD Surgeon's Group or Practice Name:  [facility] Phone number:  [phone] Fax number:  [phone]   Type of Clearance Requested:   - Medical  - Pharmacy:  Hold Aspirin NOT INDICATED HOW LONG   Type of Anesthesia:  Spinal   Additional requests/questions:    Astrid Divine   06/12/2022, 12:46 PM

## 2022-06-12 NOTE — Telephone Encounter (Signed)
Pt is already scheduled to see Coletta Memos, NP, 06/19/22, clearance will be addressed at that time.  Will route to requesting surgeon's office to make them aware.

## 2022-06-17 DIAGNOSIS — Z1152 Encounter for screening for COVID-19: Secondary | ICD-10-CM | POA: Diagnosis not present

## 2022-06-17 DIAGNOSIS — J0100 Acute maxillary sinusitis, unspecified: Secondary | ICD-10-CM | POA: Diagnosis not present

## 2022-06-17 DIAGNOSIS — J309 Allergic rhinitis, unspecified: Secondary | ICD-10-CM | POA: Diagnosis not present

## 2022-06-17 DIAGNOSIS — R5383 Other fatigue: Secondary | ICD-10-CM | POA: Diagnosis not present

## 2022-06-17 DIAGNOSIS — R058 Other specified cough: Secondary | ICD-10-CM | POA: Diagnosis not present

## 2022-06-17 DIAGNOSIS — R0981 Nasal congestion: Secondary | ICD-10-CM | POA: Diagnosis not present

## 2022-06-19 ENCOUNTER — Encounter (HOSPITAL_COMMUNITY): Payer: Medicare Other

## 2022-06-19 ENCOUNTER — Ambulatory Visit: Payer: Medicare Other | Admitting: General Practice

## 2022-06-24 NOTE — Progress Notes (Signed)
Cardiology Clinic Note   Patient Name: Heidi [name] Date of Encounter: 06/27/2022  Primary Care Provider:  Crist Infante, MD Primary Cardiologist:  None  Patient Profile    88 year old with history of HTN, and HL who was last seen by Dr.Jordan on 07/04/2021, for pre-operative assessment. No further cardiac workup was warranted.   Past Medical History    Past Medical History:  Diagnosis Date   Acute meniscal tear of left knee    Arthritis    Baker cyst, left    Chronic lower back pain    Cirrhosis, primary biliary 2008   followed by dr Trinna Balloon---  dx by liver bx 2008,  per md note no evidence on images/ labs, clinicially in remission taking ursodiol   Colon cancer ([place])    Diverticulosis of colon    GERD (gastroesophageal reflux disease)    Heart murmur    Per Cardiology no murmur   Hiatal hernia    History of basal cell carcinoma (BCC) excision 2008   forehead   Hx of colon cancer, stage I 2014   09-15-2012  s/p laparoscopy partial colectomy,  no chemo/ radiation;  previously followed by oncologist, dr Benay Spice per released for lov note in epic 12-20-2015 with no recurrence   Hyperlipidemia, mixed    Hypertension    followed by pcp   Left knee pain    Macular degeneration of both eyes    Osteoporosis    Raynaud disease    hands    Schatzki's ring    hx dilatation 11/ 2017   Ulcerative colitis    followed by dr Havery Moros---  dx 2006,  left side (rectosigmoiditis without complications)   Venous insufficiency    lower legs   Wears hearing aid in both ears    Past Surgical History:  Procedure Laterality Date   CATARACT EXTRACTION W/ INTRAOCULAR LENS  IMPLANT, BILATERAL Bilateral 2008   COLONOSCOPY     last one 01-19-2019 by dr Havery Moros   ESOPHAGOGASTRODUODENOSCOPY (EGD) WITH ESOPHAGEAL DILATION  03/14/2016   KNEE ARTHROSCOPY Right 1990's   KNEE ARTHROSCOPY Left 08/23/2015   Procedure: ARTHROSCOPY KNEE PARTIAL MEDIAL AND LATERAL MENISCECTOMY AND DEBRIDMENT;   Surgeon: Susa Day, MD;  Location: [facility];  Service: Orthopedics;  Laterality: Left;   KNEE ARTHROSCOPY WITH MEDIAL MENISECTOMY Left 12/18/2020   Procedure: KNEE ARTHROSCOPY WITH MEDIAL AND LATERAL MENISECTOMIES;  Surgeon: Renette Butters, MD;  Location: [facility];  Service: Orthopedics;  Laterality: Left;   LAPAROSCOPIC PARTIAL COLECTOMY N/A 09/15/2012   Procedure: LAPAROSCOPIC ILEOCOLECTOMY;  Surgeon: Stark Klein, MD;  Location: [facility];  Service: General;  Laterality: N/A;   ORIF PATELLA Right 09/27/2019   Procedure: OPEN REDUCTION INTERNAL (ORIF) FIXATION PATELLA;  Surgeon: Renette Butters, MD;  Location: [facility];  Service: Orthopedics;  Laterality: Right;   ORIF WRIST FRACTURE Left 09/27/2019   Procedure: OPEN REDUCTION INTERNAL FIXATION (ORIF) WRIST FRACTURE;  Surgeon: Renette Butters, MD;  Location: [facility];  Service: Orthopedics;  Laterality: Left;   TONSILLECTOMY  1952   TOTAL KNEE ARTHROPLASTY Left 09/17/2021   Procedure: TOTAL KNEE ARTHROPLASTY;  Surgeon: Renette Butters, MD;  Location: [facility];  Service: Orthopedics;  Laterality: Left;   TUBAL LIGATION  1973    Allergies  Allergies  Allergen Reactions   Codeine Nausea And Vomiting   Penicillins Hives    History of Present Illness    Heidi [name] returns today for pre-operative cardiac clearance for RT total knee replacement  on TBD and recommendations for holding ASA for spinal anesthesia.  Heidi [name] is without any cardiac complaints today.  She is not as active as she wants to be because of her right knee.  She notices most of the pain at night when she is laying in bed.  She is very hard of hearing, but responds well to questions and denies any shortness of breath, dizziness, palpitations, or near syncope.  She is able to walk around her home but not do a lot of exertional activities at this time.  She is medically compliant.  Home Medications    Current Outpatient Medications  Medication Sig  Dispense Refill   Acetaminophen (TYLENOL ARTHRITIS PAIN PO) Take 1,300 mg by mouth 2 (two) times daily.     aspirin EC 81 MG tablet Take 1 tablet (81 mg total) by mouth 2 (two) times daily. For DVT prophylaxis for 30 days after surgery. 60 tablet 0   Chlorpheniramine Maleate (ALLERGY RELIEF PO) Take 1 tablet by mouth daily.     cholecalciferol (VITAMIN D3) 25 MCG (1000 UNIT) tablet Take 1,000 Units by mouth daily.     Cyanocobalamin (VITAMIN B 12) 500 MCG TABS Take 500 mcg by mouth daily. (Patient taking differently: Take 1,000 mcg by mouth daily.) 30 tablet    ezetimibe (ZETIA) 10 MG tablet Take 5 mg by mouth 2 (two) times a week. Wednesday and Sunday.     fluticasone (FLONASE) 50 MCG/ACT nasal spray Place 1 spray into both nostrils daily as needed for allergies.  1   irbesartan (AVAPRO) 300 MG tablet Take 300 mg by mouth every evening.     mesalamine (LIALDA) 1.2 g EC tablet Take 2 tablets (2.4 g total) by mouth daily with breakfast. 180 tablet 1   Multiple Vitamins-Minerals (PRESERVISION AREDS PO) Take 1 tablet by mouth 2 (two) times daily.     omeprazole (PRILOSEC) 20 MG capsule TAKE ONE CAPSULE EACH DAY (Patient taking differently: Take 20 mg by mouth daily.) 30 capsule 6   ondansetron (ZOFRAN-ODT) 4 MG disintegrating tablet Take 1 tablet (4 mg total) by mouth 2 (two) times daily as needed for nausea or vomiting. 10 tablet 0   oxyCODONE (ROXICODONE) 5 MG immediate release tablet Take 0.5-1 tablets (2.5-5 mg total) by mouth every 6 (six) hours as needed for severe pain. Do not take more than 6 tablets in a 24 hour period. 28 tablet 0   Pitavastatin Calcium 4 MG TABS Take 2 mg by mouth 2 (two) times a week. Wednesday and Sunday     ursodiol (ACTIGALL) 300 MG capsule TAKE 2 CAPSULES EVERY OTHER DAY AND TAKE 3 CAPSULES EVERY OTHER DAY 75 capsule 0   No current facility-administered medications for this visit.     Family History    Family History  Problem Relation Age of Onset   Heart disease  Mother    Heart disease Brother    Stroke Father    Lung cancer Brother    Breast cancer Paternal Aunt    Ovarian cancer Paternal Grandmother    Colon cancer Neg Hx    Esophageal cancer Neg Hx    Stomach cancer Neg Hx    Pancreatic cancer Neg Hx    Rectal cancer Neg Hx    She indicated that her mother is deceased. She indicated that her father is deceased. She indicated that both of her brothers are deceased. She indicated that the status of her paternal grandmother is unknown. She indicated that the status of  her paternal aunt is unknown. She indicated that the status of her neg hx is unknown.  Social History    Social History   Socioeconomic History   Marital status: Widowed    Spouse name: Not on file   Number of children: 3   Years of education: Not on file   Highest education level: Not on file  Occupational History   Occupation: Compliance    Comment: insurance    Employer: [employer]  Tobacco Use   Smoking status: Former    Packs/day: 0.50    Years: 40.00    Total pack years: 20.00    Types: Cigarettes    Quit date: 01/16/2012    Years since quitting: 10.4   Smokeless tobacco: Never  Vaping Use   Vaping Use: Never used  Substance and Sexual Activity   Alcohol use: Yes    Alcohol/week: 7.0 standard drinks of alcohol    Types: 7 Glasses of wine per week    Comment: 12-13-2020  one wine daily   Drug use: No   Sexual activity: Not Currently  Other Topics Concern   Not on file  Social History Narrative   Widowed--lives alone   Drives and independent in [place]   [employer] industry   Social Determinants of Health   Financial Resource Strain: Not on file  Food Insecurity: Not on file  Transportation Needs: Not on file  Physical Activity: Not on file  Stress: Not on file  Social Connections: Not on file  Intimate Partner Violence: Not on file     Review of Systems    General:  No chills, fever, night sweats or weight changes.   Cardiovascular:  No chest pain, dyspnea on exertion, edema, orthopnea, palpitations, paroxysmal nocturnal dyspnea. Dermatological: No rash, lesions/masses Respiratory: No cough, dyspnea Urologic: No hematuria, dysuria Abdominal:   No nausea, vomiting, diarrhea, bright red blood per rectum, melena, or hematemesis Neurologic:  No visual changes, wkns, changes in mental status. All other systems reviewed and are otherwise negative except as noted above.     Physical Exam    VS:  BP (!) 148/78   Pulse (!) 58   Ht '5\' 2"'$  (1.575 m)   Wt 129 lb 3.2 oz (58.6 kg)   SpO2 99%   BMI 23.63 kg/m  , BMI Body mass index is 23.63 kg/m.     GEN: Well nourished, well developed, in no acute distress. HEENT: normal. Neck: Supple, no JVD, carotid bruits, or masses. Cardiac: RRR, no murmurs, rubs, or gallops. No clubbing, cyanosis, edema.  Radials/DP/PT 2+ and equal bilaterally.  Respiratory:  Respirations regular and unlabored, clear to auscultation bilaterally. GI: Soft, nontender, nondistended, BS + x 4. MS: no deformity or atrophy.  Stiff range of motion of the right knee Skin: warm and dry, no rash. Neuro:  Strength and sensation are intact.  Very hard of hearing Psych: Normal affect.  Accessory Clinical Findings    ECG personally reviewed by me today-sinus bradycardia, heart rate 58 bpm- No acute changes  Lab Results  Component Value Date   WBC 5.3 08/21/2021   HGB 13.7 08/21/2021   HCT 41.2 08/21/2021   MCV 98.2 08/21/2021   PLT 211.0 08/21/2021   Lab Results  Component Value Date   CREATININE 0.78 08/21/2021   BUN 22 08/21/2021   NA 140 08/21/2021   K 4.2 08/21/2021   CL 106 08/21/2021   CO2 26 08/21/2021   Lab Results  Component Value Date   ALT  13 08/21/2021   AST 15 08/21/2021   ALKPHOS 59 08/21/2021   BILITOT 0.7 08/21/2021   No results found for: "CHOL", "HDL", "LDLCALC", "LDLDIRECT", "TRIG", "CHOLHDL"  No results found for: "HGBA1C"  Review of Prior  Studies:   Assessment & Plan   Pre-Operative Cardiac Evaluation: Chart reviewed as part of pre-operative protocol coverage. Given past medical history and time since last visit, based on ACC/AHA guidelines, Heidi [name] would be at acceptable risk for the planned procedure without further cardiovascular testing.  Okay to hold aspirin perioperatively, and begin again as soon as hemodynamically stable.  2. Hypertension: Elevated today in the office.  I did recheck it and it was correlating with initial blood pressure of 148/78.  She admits to eating salty foods and I have instructed her on reduction of this.  She has been on HCTZ in the past which she was taking twice a week.  She became very dizzy on this.  May need to reconsider adding this or adding addition of low-dose amlodipine.  Will check echocardiogram as 1 has not been completed with longstanding hypertension.  This will not impact her ability to proceed with surgery.    Current medicines are reviewed at length with the patient today.  I have spent 25 min's  dedicated to the care of this patient on the date of this encounter to include pre-visit review of records, assessment, management and diagnostic testing,with shared decision making. Signed, Phill Myron. [place] Pugh, ANP, [place]   06/27/2022 12:56 PM      Office [phone] Fax [phone]  Notice: This dictation was prepared with Dragon dictation along with smaller phrase technology. Any transcriptional errors that result from this process are unintentional and may not be corrected upon review.

## 2022-06-25 ENCOUNTER — Other Ambulatory Visit (HOSPITAL_COMMUNITY): Payer: Self-pay

## 2022-06-26 ENCOUNTER — Ambulatory Visit
Admission: RE | Admit: 2022-06-26 | Discharge: 2022-06-26 | Disposition: A | Discharge status: Home / Self Care | Payer: Medicare Other | Source: Ambulatory Visit | Attending: Internal Medicine | Admitting: Internal Medicine

## 2022-06-26 DIAGNOSIS — I10 Essential (primary) hypertension: Secondary | ICD-10-CM | POA: Diagnosis not present

## 2022-06-26 DIAGNOSIS — Z01818 Encounter for other preprocedural examination: Secondary | ICD-10-CM | POA: Insufficient documentation

## 2022-06-26 MED ORDER — DENOSUMAB 60 MG/ML ~~LOC~~ SOSY
PREFILLED_SYRINGE | SUBCUTANEOUS | Status: AC
  Filled 2022-06-26: qty 1

## 2022-06-26 MED ORDER — DENOSUMAB 60 MG/ML ~~LOC~~ SOSY
60.0000 mg | PREFILLED_SYRINGE | Freq: Once | SUBCUTANEOUS | Status: AC
  Administered 2022-06-26: 60 mg via SUBCUTANEOUS

## 2022-06-27 ENCOUNTER — Encounter: Payer: Self-pay | Admitting: Adult Health

## 2022-06-27 ENCOUNTER — Ambulatory Visit (INDEPENDENT_AMBULATORY_CARE_PROVIDER_SITE_OTHER): Payer: Medicare Other | Admitting: Adult Health

## 2022-06-27 VITALS — BP 148/78 | HR 58 | Ht 62.0 in | Wt 129.2 lb

## 2022-06-27 DIAGNOSIS — Z01818 Encounter for other preprocedural examination: Secondary | ICD-10-CM | POA: Diagnosis not present

## 2022-06-27 DIAGNOSIS — I10 Essential (primary) hypertension: Secondary | ICD-10-CM

## 2022-06-27 NOTE — Patient Instructions (Signed)
Medication Instructions:  No Changes *If you need a refill on your cardiac medications before your next appointment, please call your pharmacy*   Lab Work: No Labs If you have labs (blood work) drawn today and your tests are completely normal, you will receive your results only by: [facility] (if you have MyChart) OR A paper copy in the mail If you have any lab test that is abnormal or we need to change your treatment, we will call you to review the results.   Testing/Procedures: [address], [address]. Your physician has requested that you have an echocardiogram. Echocardiography is a painless test that uses sound waves to create images of your heart. It provides your doctor with information about the size and shape of your heart and how well your heart's chambers and valves are working. This procedure takes approximately one hour. There are no restrictions for this procedure. Please do NOT wear cologne, perfume, aftershave, or lotions (deodorant is allowed). Please arrive 15 minutes prior to your appointment time.    Follow-Up: At [facility] you and your health needs are our priority.  As part of our continuing mission to provide you with exceptional heart care, we have created designated Provider Care Teams.  These Care Teams include your primary Cardiologist (physician) and Advanced Practice Providers (APPs -  Physician Assistants and Nurse Practitioners) who all work together to provide you with the care you need, when you need it.  We recommend signing up for the patient portal called "MyChart".  Sign up information is provided on this After Visit Summary.  MyChart is used to connect with patients for Virtual Visits (Telemedicine).  Patients are able to view lab/test results, encounter notes, upcoming appointments, etc.  Non-urgent messages can be sent to your provider as well.   To learn more about what you can do with MyChart, go to  [email or web address].    Your next appointment:   6 month(s)  Provider:   Peter Martinique, MD

## 2022-07-03 DIAGNOSIS — M1711 Unilateral primary osteoarthritis, right knee: Secondary | ICD-10-CM | POA: Diagnosis not present

## 2022-07-07 ENCOUNTER — Other Ambulatory Visit: Payer: Self-pay | Admitting: Gastroenterology

## 2022-07-11 NOTE — Progress Notes (Signed)
Sent message, via epic in basket, requesting orders in epic from surgeon.  

## 2022-07-15 ENCOUNTER — Ambulatory Visit: Payer: Medicare Other | Admitting: General Practice

## 2022-07-16 NOTE — Patient Instructions (Signed)
SURGICAL WAITING ROOM VISITATION Patients having surgery or a procedure may have no more than 2 support people in the waiting area - these visitors may rotate.    If the patient needs to stay at the hospital during part of their recovery, the visitor guidelines for inpatient rooms apply. Pre-op nurse will coordinate an appropriate time for 1 support person to accompany patient in pre-op.  This support person may not rotate.    Please refer to the [facility] website for the visitor guidelines for Inpatients (after your surgery is over and you are in a regular room).   Due to an increase in RSV and influenza rates and associated hospitalizations, children ages 49 and under may not visit patients in [place].     Your procedure is scheduled on: 07-29-22   Report to [facility] Main Entrance    Report to admitting at 5:15 AM   Call this number if you have problems the morning of surgery [phone]   Do not eat food :After Midnight.   After Midnight you may have the following liquids until  4:30 AM DAY OF SURGERY  Water Non-Citrus Juices (without pulp, NO RED) Carbonated Beverages Black Coffee (NO MILK/CREAM OR CREAMERS, sugar ok)  Clear Tea (NO MILK/CREAM OR CREAMERS, sugar ok) regular and decaf                             Plain Jell-O (NO RED)                                           Fruit ices (not with fruit pulp, NO RED)                                     Popsicles (NO RED)                                                               Sports drinks like Gatorade (NO RED)                   The day of surgery:  Drink ONE (1) Pre-Surgery Clear Ensure at 4:30 AM the morning of surgery. Drink in one sitting. Do not sip.  This drink was given to you during your hospital  pre-op appointment visit. Nothing else to drink after completing the Pre-Surgery Clear Ensure          If you have questions, please contact your surgeon's office.   FOLLOW ANY  ADDITIONAL PRE OP INSTRUCTIONS YOU RECEIVED FROM YOUR SURGEON'S OFFICE!!!     Oral Hygiene is also important to reduce your risk of infection.                                    Remember - BRUSH YOUR TEETH THE MORNING OF SURGERY WITH YOUR REGULAR TOOTHPASTE   Do NOT smoke after Midnight   Take these medicines the morning of surgery with A SIP OF WATER:  Ezetimibe  Mesalamine  Omeprazole  Pitavastatin  Ursodiol  Tylenol if needed  Flonase nasal spray                              You may not have any metal on your body including hair pins, jewelry, and body piercing             Do not wear make-up, lotions, powders, perfumes or deodorant  Do not wear nail polish including gel and S&S, artificial/acrylic nails, or any other type of covering on natural nails including finger and toenails. If you have artificial nails, gel coating, etc. that needs to be removed by a nail salon please have this removed prior to surgery or surgery may need to be canceled/ delayed if the surgeon/ anesthesia feels like they are unable to be safely monitored.   Do not shave  48 hours prior to surgery.    Do not bring valuables to the hospital. [place].   Contacts, dentures or bridgework may not be worn into surgery.   Bring small overnight bag day of surgery.   DO NOT [place]. PHARMACY WILL DISPENSE MEDICATIONS LISTED ON YOUR MEDICATION LIST TO YOU DURING YOUR ADMISSION [place]!   Special Instructions: Bring a copy of your healthcare power of attorney and living will documents the day of surgery if you haven't scanned them before.              Please read over the following fact sheets you were given: IF [place]  If you received a COVID test during your pre-op visit  it is requested that you wear a mask when out in public, stay away from anyone that  may not be feeling well and notify your surgeon if you develop symptoms. If you test positive for Covid or have been in contact with anyone that has tested positive in the last 10 days please notify you surgeon.  [place] - Preparing for Surgery Before surgery, you can play an important role.  Because skin is not sterile, your skin needs to be as free of germs as possible.  You can reduce the number of germs on your skin by washing with CHG (chlorahexidine gluconate) soap before surgery.  CHG is an antiseptic cleaner which kills germs and bonds with the skin to continue killing germs even after washing. Please DO NOT use if you have an allergy to CHG or antibacterial soaps.  If your skin becomes reddened/irritated stop using the CHG and inform your nurse when you arrive at Short Stay. Do not shave (including legs and underarms) for at least 48 hours prior to the first CHG shower.  You may shave your face/neck.  Please follow these instructions carefully:  1.  Shower with CHG Soap the night before surgery and the  morning of surgery.  2.  If you choose to wash your hair, wash your hair first as usual with your normal  shampoo.  3.  After you shampoo, rinse your hair and body thoroughly to remove the shampoo.                             4.  Use CHG as you would any other liquid soap.  You can apply chg directly to  the skin and wash.  Gently with a scrungie or clean washcloth.  5.  Apply the CHG Soap to your body ONLY FROM THE NECK DOWN.   Do   not use on face/ open                           Wound or open sores. Avoid contact with eyes, ears mouth and   genitals (private parts).                       Wash face,  Genitals (private parts) with your normal soap.             6.  Wash thoroughly, paying special attention to the area where your    surgery  will be performed.  7.  Thoroughly rinse your body with warm water from the neck down.  8.  DO NOT shower/wash with your normal soap after using and  rinsing off the CHG Soap.                9.  Pat yourself dry with a clean towel.            10.  Wear clean pajamas.            11.  Place clean sheets on your bed the night of your first shower and do not  sleep with pets. Day of Surgery : Do not apply any lotions/deodorants the morning of surgery.  Please wear clean clothes to the hospital/surgery center.  FAILURE TO FOLLOW THESE INSTRUCTIONS MAY RESULT IN THE CANCELLATION OF YOUR SURGERY  PATIENT SIGNATURE_________________________________  NURSE SIGNATURE__________________________________  ________________________________________________________________________    Heidi [name]  An incentive spirometer is a tool that can help keep your lungs clear and active. This tool measures how well you are filling your lungs with each breath. Taking long deep breaths may help reverse or decrease the chance of developing breathing (pulmonary) problems (especially infection) following: A long period of time when you are unable to move or be active. BEFORE THE PROCEDURE  If the spirometer includes an indicator to show your best effort, your nurse or respiratory therapist will set it to a desired goal. If possible, sit up straight or lean slightly forward. Try not to slouch. Hold the incentive spirometer in an upright position. INSTRUCTIONS FOR USE  Sit on the edge of your bed if possible, or sit up as far as you can in bed or on a chair. Hold the incentive spirometer in an upright position. Breathe out normally. Place the mouthpiece in your mouth and seal your lips tightly around it. Breathe in slowly and as deeply as possible, raising the piston or the ball toward the top of the column. Hold your breath for 3-5 seconds or for as long as possible. Allow the piston or ball to fall to the bottom of the column. Remove the mouthpiece from your mouth and breathe out normally. Rest for a few seconds and repeat Steps 1 through 7 at least 10  times every 1-2 hours when you are awake. Take your time and take a few normal breaths between deep breaths. The spirometer may include an indicator to show your best effort. Use the indicator as a goal to work toward during each repetition. After each set of 10 deep breaths, practice coughing to be sure your lungs are clear. If you have an incision (the cut made at the time of surgery),  support your incision when coughing by placing a pillow or rolled up towels firmly against it. Once you are able to get out of bed, walk around indoors and cough well. You may stop using the incentive spirometer when instructed by your caregiver.  RISKS AND COMPLICATIONS Take your time so you do not get dizzy or light-headed. If you are in pain, you may need to take or ask for pain medication before doing incentive spirometry. It is harder to take a deep breath if you are having pain. AFTER USE Rest and breathe slowly and easily. It can be helpful to keep track of a log of your progress. Your caregiver can provide you with a simple table to help with this. If you are using the spirometer at home, follow these instructions: [place] IF:  You are having difficultly using the spirometer. You have trouble using the spirometer as often as instructed. Your pain medication is not giving enough relief while using the spirometer. You develop fever of 100.5 F (38.1 C) or higher. SEEK IMMEDIATE MEDICAL CARE IF:  You cough up bloody sputum that had not been present before. You develop fever of 102 F (38.9 C) or greater. You develop worsening pain at or near the incision site. MAKE SURE YOU:  Understand these instructions. Will watch your condition. Will get help right away if you are not doing well or get worse. Document Released: 09/01/2006 Document Revised: 07/14/2011 Document Reviewed: 11/02/2006 [facility] Patient Information 2014 [place].   ________________________________________________________________________

## 2022-07-16 NOTE — Progress Notes (Addendum)
COVID Vaccine Completed:  Yes x4  Date of COVID positive in last 90 days:  No  PCP - Crist Infante, MD (office note requested) Cardiologist - Jory Sims, DNP to eval murmur  Cardiac clearance in Epic dated 06-27-22 by Jory Sims  Chest x-ray - N/A EKG - 06-27-22 Epic Stress Test - 20 Years ago ECHO - 07-25-22 scheduled  Cardiac Cath - N/A Pacemaker/ICD device last checked: Spinal Cord Stimulator: N/A  Bowel Prep - N/A  Sleep Study - N/A CPAP -   Fasting Blood Sugar - N/A Checks Blood Sugar _____ times a day  Last dose of GLP1 agonist-  N/A GLP1 instructions:  N/A   Last dose of SGLT-2 inhibitors-  N/A SGLT-2 instructions: N/A  Blood Thinner Instructions: Aspirin Instructions:  N/A Last Dose:  Activity level:  Can go up a flight of stairs and perform activities of daily living without stopping and without symptoms of chest pain or shortness of breath.  Some limitations due to knee pain  Anesthesia review:  Murmur, scheduled for echocardiogram to evaluate.  BP elevated at PAT, patient denies any symptoms.  States that BP tends to be high in the mornings, when she checks in the afternoon it is lower from 130 to 140s/80s.  Patient denies shortness of breath, fever, cough and chest pain at PAT appointment  Patient verbalized understanding of instructions that were given to them at the PAT appointment. Patient was also instructed that they will need to review over the PAT instructions again at home before surgery.

## 2022-07-18 ENCOUNTER — Encounter (HOSPITAL_COMMUNITY)
Admission: RE | Admit: 2022-07-18 | Discharge: 2022-07-18 | Disposition: A | Discharge status: Home / Self Care | Payer: Medicare Other | Source: Ambulatory Visit | Attending: Orthopedic Surgery | Admitting: Orthopedic Surgery

## 2022-07-18 ENCOUNTER — Encounter (HOSPITAL_COMMUNITY): Payer: Self-pay

## 2022-07-18 ENCOUNTER — Other Ambulatory Visit: Payer: Self-pay

## 2022-07-18 VITALS — BP 167/96 | HR 58 | Temp 98.0°F | Resp 20 | Ht 62.0 in | Wt 125.2 lb

## 2022-07-18 DIAGNOSIS — Z01812 Encounter for preprocedural laboratory examination: Secondary | ICD-10-CM | POA: Insufficient documentation

## 2022-07-18 DIAGNOSIS — K769 Liver disease, unspecified: Secondary | ICD-10-CM | POA: Diagnosis not present

## 2022-07-18 DIAGNOSIS — Z01818 Encounter for other preprocedural examination: Secondary | ICD-10-CM

## 2022-07-18 HISTORY — DX: Pneumonia, unspecified organism: J18.9

## 2022-07-18 LAB — CBC
HCT: 42.4 % (ref 36.0–46.0)
Hemoglobin: 13.7 g/dL (ref 12.0–15.0)
MCH: 32.2 pg (ref 26.0–34.0)
MCHC: 32.3 g/dL (ref 30.0–36.0)
MCV: 99.8 fL (ref 80.0–100.0)
Platelets: 223 10*3/uL (ref 150–400)
RBC: 4.25 MIL/uL (ref 3.87–5.11)
RDW: 13.3 % (ref 11.5–15.5)
WBC: 4.5 10*3/uL (ref 4.0–10.5)
nRBC: 0 % (ref 0.0–0.2)

## 2022-07-18 LAB — SURGICAL PCR SCREEN
MRSA, PCR: NEGATIVE
Staphylococcus aureus: NEGATIVE

## 2022-07-18 LAB — COMPREHENSIVE METABOLIC PANEL
ALT: 11 U/L (ref 0–44)
AST: 21 U/L (ref 15–41)
Albumin: 4.2 g/dL (ref 3.5–5.0)
Alkaline Phosphatase: 58 U/L (ref 38–126)
Anion gap: 8 (ref 5–15)
BUN: 20 mg/dL (ref 8–23)
CO2: 23 mmol/L (ref 22–32)
Calcium: 8.7 mg/dL — ABNORMAL LOW (ref 8.9–10.3)
Chloride: 107 mmol/L (ref 98–111)
Creatinine, Ser: 0.79 mg/dL (ref 0.44–1.00)
GFR, Estimated: >60 mL/min (ref >60)
Glucose, Bld: 91 mg/dL (ref 70–99)
Potassium: 4.6 mmol/L (ref 3.5–5.1)
Sodium: 138 mmol/L (ref 135–145)
Total Bilirubin: 1.2 mg/dL (ref 0.3–1.2)
Total Protein: 7 g/dL (ref 6.5–8.1)

## 2022-07-22 NOTE — H&P (Signed)
KNEE ARTHROPLASTY ADMISSION H&P  Patient ID: Heidi [name] MRN: [id number] DOB/AGE: 1935-03-24 88 y.o.  Chief Complaint: right knee pain.  Planned Procedure Date: 07/29/22 Medical Clearance by Dr. Crist Infante   Cardiac Clearance by Janalyn Shy NP   HPI: Heidi [name] is a 88 y.o. female who presents for evaluation of OA RIGHT KNEE. The patient has a history of pain and functional disability in the right knee due to arthritis and has failed non-surgical conservative treatments for greater than 12 weeks to include NSAID's and/or analgesics, corticosteriod injections, supervised PT with diminished ADL's post treatment, and activity modification.  Onset of symptoms was gradual, starting 2 years ago with gradually worsening course since that time. The patient noted prior procedures on the knee to include  arthroscopy, menisectomy, and patella ORIF  on the right knee.  Patient currently rates pain at 5 out of 10 with activity. Patient has night pain, worsening of pain with activity and weight bearing, and pain that interferes with activities of daily living.  Patient has evidence of subchondral sclerosis, periarticular osteophytes, joint space narrowing, and stable implantation of ORIF screws in the patella  by imaging studies.  There is no active infection.  Past Medical History:  Diagnosis Date   Acute meniscal tear of left knee    Arthritis    Baker cyst, left    Chronic lower back pain    Cirrhosis, primary biliary 2008   followed by dr Trinna Balloon---  dx by liver bx 2008,  per md note no evidence on images/ labs, clinicially in remission taking ursodiol   Colon cancer ([place])    Diverticulosis of colon    GERD (gastroesophageal reflux disease)    Heart murmur    Per Cardiology no murmur   Hiatal hernia    History of basal cell carcinoma (BCC) excision 2008   forehead   Hx of colon cancer, stage I 2014   09-15-2012  s/p laparoscopy partial colectomy,  no chemo/ radiation;   previously followed by oncologist, dr Benay Spice per released for lov note in epic 12-20-2015 with no recurrence   Hyperlipidemia, mixed    Hypertension    followed by pcp   Left knee pain    Macular degeneration of both eyes    Osteoporosis    Pneumonia    Raynaud disease    hands    Schatzki's ring    hx dilatation 11/ 2017   Ulcerative colitis    followed by dr Havery Moros---  dx 2006,  left side (rectosigmoiditis without complications)   Venous insufficiency    lower legs   Wears hearing aid in both ears    Past Surgical History:  Procedure Laterality Date   CATARACT EXTRACTION W/ INTRAOCULAR LENS  IMPLANT, BILATERAL Bilateral 2008   COLONOSCOPY     last one 01-19-2019 by dr Havery Moros   ESOPHAGOGASTRODUODENOSCOPY (EGD) WITH ESOPHAGEAL DILATION  03/14/2016   KNEE ARTHROSCOPY Right 1990's   KNEE ARTHROSCOPY Left 08/23/2015   Procedure: ARTHROSCOPY KNEE PARTIAL MEDIAL AND LATERAL MENISCECTOMY AND DEBRIDMENT;  Surgeon: Susa Day, MD;  Location: [facility];  Service: Orthopedics;  Laterality: Left;   KNEE ARTHROSCOPY WITH MEDIAL MENISECTOMY Left 12/18/2020   Procedure: KNEE ARTHROSCOPY WITH MEDIAL AND LATERAL MENISECTOMIES;  Surgeon: Renette Butters, MD;  Location: [facility];  Service: Orthopedics;  Laterality: Left;   LAPAROSCOPIC PARTIAL COLECTOMY N/A 09/15/2012   Procedure: LAPAROSCOPIC ILEOCOLECTOMY;  Surgeon: Stark Klein, MD;  Location: [facility];  Service: General;  Laterality: N/A;  ORIF PATELLA Right 09/27/2019   Procedure: OPEN REDUCTION INTERNAL (ORIF) FIXATION PATELLA;  Surgeon: Renette Butters, MD;  Location: [facility];  Service: Orthopedics;  Laterality: Right;   ORIF WRIST FRACTURE Left 09/27/2019   Procedure: OPEN REDUCTION INTERNAL FIXATION (ORIF) WRIST FRACTURE;  Surgeon: Renette Butters, MD;  Location: [facility];  Service: Orthopedics;  Laterality: Left;   TONSILLECTOMY  1952   TOTAL KNEE ARTHROPLASTY Left 09/17/2021   Procedure: TOTAL KNEE  ARTHROPLASTY;  Surgeon: Renette Butters, MD;  Location: [facility];  Service: Orthopedics;  Laterality: Left;   TUBAL LIGATION  1973   Allergies  Allergen Reactions   Codeine Nausea And Vomiting   Penicillins Hives   Prior to Admission medications   Medication Sig Start Date End Date Taking? Authorizing Provider  Acetaminophen (TYLENOL ARTHRITIS PAIN PO) Take 1,300 mg by mouth every 8 (eight) hours as needed (pain).   Yes [provider]  amLODipine (NORVASC) 5 MG tablet Take 5 mg by mouth at bedtime.   Yes [provider]  Chlorpheniramine Maleate (ALLERGY RELIEF PO) Take 1 tablet by mouth daily.   Yes [provider]  ezetimibe (ZETIA) 10 MG tablet Take 5 mg by mouth every Monday, Wednesday, and Friday.   Yes [provider]  fluticasone (FLONASE) 50 MCG/ACT nasal spray Place 1 spray into both nostrils daily as needed for allergies. 01/19/18  Yes [provider]  irbesartan (AVAPRO) 300 MG tablet Take 300 mg by mouth in the morning. 09/14/19  Yes [provider]  mesalamine (LIALDA) 1.2 g EC tablet Take 2 tablets (2.4 g total) by mouth daily with breakfast. 01/29/22  Yes Armbruster, Carlota Raspberry, MD  omeprazole (PRILOSEC) 20 MG capsule TAKE ONE CAPSULE EACH DAY 06/02/14  Yes Inda Castle, MD  Pitavastatin Calcium 4 MG TABS Take 2 mg by mouth every Monday, Wednesday, and Friday.   Yes [provider]  ursodiol (ACTIGALL) 300 MG capsule TAKE 2 CAPSULES EVERY OTHER DAY AND TAKE 3 CAPSULES EVERY OTHER DAY. Please schedule a yearly follow up for further refills. 07/07/22  Yes Armbruster, Carlota Raspberry, MD  aspirin EC 81 MG tablet Take 1 tablet (81 mg total) by mouth 2 (two) times daily. For DVT prophylaxis for 30 days after surgery. Patient not taking: Reported on 07/17/2022 09/18/21   Britt Bottom, PA-C  cholecalciferol (VITAMIN D3) 25 MCG (1000 UNIT) tablet Take 1,000 Units by mouth daily.    [provider]  Cyanocobalamin (VITAMIN B  12) 500 MCG TABS Take 500 mcg by mouth daily. Patient taking differently: Take 1,000 mcg by mouth daily. 09/29/19   Desiree Hane, MD  Multiple Vitamins-Minerals (PRESERVISION AREDS PO) Take 1 tablet by mouth 2 (two) times daily.    [provider]  ondansetron (ZOFRAN-ODT) 4 MG disintegrating tablet Take 1 tablet (4 mg total) by mouth 2 (two) times daily as needed for nausea or vomiting. Patient not taking: Reported on 07/17/2022 09/18/21   Britt Bottom, PA-C  oxyCODONE (ROXICODONE) 5 MG immediate release tablet Take 0.5-1 tablets (2.5-5 mg total) by mouth every 6 (six) hours as needed for severe pain. Do not take more than 6 tablets in a 24 hour period. Patient not taking: Reported on 07/17/2022 09/18/21   Britt Bottom, PA-C   Social History   Socioeconomic History   Marital status: Widowed    Spouse name: Not on file   Number of children: 3   Years of education: Not on file  Highest education level: Not on file  Occupational History   Occupation: Compliance    Comment: insurance    Employer: [employer]  Tobacco Use   Smoking status: Former    Packs/day: 0.50    Years: 40.00    Additional pack years: 0.00    Total pack years: 20.00    Types: Cigarettes    Quit date: 01/16/2012    Years since quitting: 10.5   Smokeless tobacco: Never  Vaping Use   Vaping Use: Never used  Substance and Sexual Activity   Alcohol use: Yes    Alcohol/week: 7.0 standard drinks of alcohol    Types: 7 Glasses of wine per week    Comment: 12-13-2020  one wine daily   Drug use: No   Sexual activity: Not Currently  Other Topics Concern   Not on file  Social History Narrative   Widowed--lives alone   Drives and independent in ADLs   [employer] industry   Social Determinants of Health   Financial Resource Strain: Not on file  Food Insecurity: Not on file  Transportation Needs: Not on file  Physical Activity: Not on file  Stress: Not on file  Social  Connections: Not on file   Family History  Problem Relation Age of Onset   Heart disease Mother    Heart disease Brother    Stroke Father    Lung cancer Brother    Breast cancer Paternal Aunt    Ovarian cancer Paternal Grandmother    Colon cancer Neg Hx    Esophageal cancer Neg Hx    Stomach cancer Neg Hx    Pancreatic cancer Neg Hx    Rectal cancer Neg Hx     ROS: Currently denies lightheadedness, dizziness, Fever, chills, CP, SOB.   No personal history of DVT, PE, MI, or CVA. No loose teeth or dentures All other systems have been reviewed and were otherwise currently negative with the exception of those mentioned in the HPI and as above.  Objective: Vitals: Ht: 5'1" Wt: 130 lbs Temp: 97.5 BP: 167/84 Pulse: 62 O2 98% on room air.   Physical Exam: General: Alert, NAD.  Antalgic Gait  HEENT: EOMI, Good Neck Extension. Very HOH Pulm: No increased work of breathing.  Clear B/L A/P w/o crackle or wheeze.  CV: RRR, No m/g/r appreciated  GI: soft, NT, ND. BS x 4 quadrants Neuro: CN II-XII grossly intact without focal deficit.  Sensation intact distally Skin: No lesions in the area of chief complaint MSK/Surgical Site:   + JLT. ROM slow from 0-110 degrees.  Decreased strength in extension and flexion.  +EHL/FHL.  NVI. Pain and instability with varus and valgus stress.    Imaging Review Plain radiographs demonstrate severe degenerative joint disease of the right knee.   The overall alignment issignificant valgus. The bone quality appears to be fair for age and reported activity level.  Preoperative templating of the joint replacement has been completed, documented, and submitted to the Operating Room personnel in order to optimize intra-operative equipment management.  Assessment: OA RIGHT KNEE Active Problems:   * No active hospital problems. *   Plan: Plan for Procedure(s): TOTAL KNEE ARTHROPLASTY  The patient history, physical exam, clinical judgement of the provider  and imaging are consistent with end stage degenerative joint disease and total joint arthroplasty is deemed medically necessary. The treatment options including medical management, injection therapy, and arthroplasty were discussed at length. The risks and benefits of Procedure(s): TOTAL KNEE ARTHROPLASTY were presented  and reviewed.  The risks of nonoperative treatment, versus surgical intervention including but not limited to continued pain, aseptic loosening, stiffness, dislocation/subluxation, infection, bleeding, nerve injury, blood clots, cardiopulmonary complications, morbidity, mortality, among others were discussed. The patient verbalizes understanding and wishes to proceed with the plan.  Patient is being admitted for inpatient treatment for surgery, pain control, PT, prophylactic antibiotics, VTE prophylaxis, progressive ambulation, ADL's and discharge planning. She will spend the night in observation.  Dental prophylaxis discussed and recommended for 2 years postoperatively.  The patient does meet the criteria for TXA which will be used perioperatively.   ASA 81 mg BID will be used postoperatively for DVT prophylaxis in addition to SCDs, and early ambulation. Plan for Tylenol and Oxycodone for pain.   Robaxin for muscle spasms.   Zofran for nausea and vomiting. Colace for constipation prevention Pharmacy- [facility] on [address] in [place] The patient is planning to be discharged home with OPPT and into the care of her daughter [name] who can be reached at [phone] or her daughter Erasmo Downer who can be reached at [phone] Follow up appt 08/13/22 at 4:15pm     Alisa Graff Office [phone] 07/22/2022 3:28 PM

## 2022-07-23 NOTE — Care Plan (Signed)
Ortho Bundle Case Management Note  Patient Details  Name: Heidi [name] MRN: [id number] Date of Birth: 06/19/1934  met with patient in the office for H&P. she will discharge to home with family to assist. has rolling walker, CPM ordered. OPPT set up with [facility]. discharge instructions discussed and questions answered.                    DME Arranged:  [facility] DME Agency:  [facility]  HH Arranged:    [place] Agency:     Additional Comments: Please contact me with any questions of if this plan should need to change.  Ladell Heads,  [facility]  [phone] 07/23/2022, 12:50 PM

## 2022-07-25 ENCOUNTER — Ambulatory Visit (HOSPITAL_COMMUNITY): Payer: Medicare Other | Attending: Adult Health

## 2022-07-25 DIAGNOSIS — Z01818 Encounter for other preprocedural examination: Secondary | ICD-10-CM | POA: Diagnosis not present

## 2022-07-25 DIAGNOSIS — I10 Essential (primary) hypertension: Secondary | ICD-10-CM | POA: Diagnosis not present

## 2022-07-26 LAB — ECHOCARDIOGRAM COMPLETE
Area-P 1/2: 3.24 cm2
MV M vel: 5.74 m/s
MV Peak grad: 131.8 mmHg
S' Lateral: 2 cm

## 2022-07-28 ENCOUNTER — Encounter (HOSPITAL_COMMUNITY): Payer: Self-pay | Admitting: Physician Assistant

## 2022-07-28 ENCOUNTER — Telehealth: Payer: Self-pay

## 2022-07-28 DIAGNOSIS — N390 Urinary tract infection, site not specified: Secondary | ICD-10-CM | POA: Diagnosis not present

## 2022-07-28 NOTE — Progress Notes (Signed)
Anesthesia Chart Review   Case: [id number] Date/Time: 07/29/22 0715   Procedure: TOTAL KNEE ARTHROPLASTY (Right: Knee)   Anesthesia type: Spinal   Pre-op diagnosis: OA RIGHT KNEE   Location: [facility]OM 08 / [facility]   Surgeons: Renette Butters, MD       DISCUSSION:88 y.o. former smoker with h/o GERD, HTN, right knee OA scheduled for above procedure 07/29/2022 with Dr. Edmonia Lynch.   Pt seen by cardiology 06/27/2022 for preoperative evaluation.  Per OV note, "Chart reviewed as part of pre-operative protocol coverage. Given past medical history and time since last visit, based on ACC/AHA guidelines, LEIGHLAH [name] would be at acceptable risk for the planned procedure without further cardiovascular testing.  Okay to hold aspirin perioperatively, and begin again as soon as hemodynamically stable. "  Echo 07/25/22 with EF 65-70%, mild mitral valve regurgitation, no aortic stenosis.   Anticipate pt can proceed with planned procedure barring acute status change.   VS: BP (!) 167/96   Pulse (!) 58   Temp 36.7 C (Oral)   Resp 20   Ht 5\' 2"  (1.575 m)   Wt 56.8 kg   SpO2 100%   BMI 22.90 kg/m   PROVIDERS: Crist Infante, MD is PCP    LABS: Labs reviewed: Acceptable for surgery. (all labs ordered are listed, but only abnormal results are displayed)  Labs Reviewed  COMPREHENSIVE METABOLIC PANEL - Abnormal; Notable for the following components:      Result Value   Calcium 8.7 (*)    All other components within normal limits  SURGICAL PCR SCREEN  CBC     IMAGES:   EKG:   CV: Echo 07/25/22  1. Left ventricular ejection fraction, by estimation, is 65 to 70%. Left  ventricular ejection fraction by PLAX is 69 %. The left ventricle has  normal function. The left ventricle has no regional wall motion  abnormalities. There is mild concentric left  ventricular hypertrophy. Left ventricular diastolic parameters are  consistent with Grade I diastolic dysfunction (impaired relaxation).  The  average left ventricular global longitudinal strain is -19.6 %. The global  longitudinal strain is normal.   2. Right ventricular systolic function is normal. The right ventricular  size is normal. There is normal pulmonary artery systolic pressure.   3. The mitral valve is normal in structure. Mild mitral valve  regurgitation. No evidence of mitral stenosis. There is mild late systolic  prolapse of the middle segment of the anterior leaflet of the mitral  valve.   4. Left coronary cusp is fused. The aortic valve is tricuspid. There is  mild calcification of the aortic valve. There is mild thickening of the  aortic valve. Aortic valve regurgitation is not visualized. No aortic  stenosis is present.   5. The inferior vena cava is normal in size with greater than 50%  respiratory variability, suggesting right atrial pressure of 3 mmHg.  Past Medical History:  Diagnosis Date   Acute meniscal tear of left knee    Arthritis    Baker cyst, left    Chronic lower back pain    Cirrhosis, primary biliary 2008   followed by dr Trinna Balloon---  dx by liver bx 2008,  per md note no evidence on images/ labs, clinicially in remission taking ursodiol   Colon cancer ([place])    Diverticulosis of colon    GERD (gastroesophageal reflux disease)    Heart murmur    Per Cardiology no murmur   Hiatal hernia  History of basal cell carcinoma (BCC) excision 2008   forehead   Hx of colon cancer, stage I 2014   09-15-2012  s/p laparoscopy partial colectomy,  no chemo/ radiation;  previously followed by oncologist, dr Benay Spice per released for lov note in epic 12-20-2015 with no recurrence   Hyperlipidemia, mixed    Hypertension    followed by pcp   Left knee pain    Macular degeneration of both eyes    Osteoporosis    Pneumonia    Raynaud disease    hands    Schatzki's ring    hx dilatation 11/ 2017   Ulcerative colitis    followed by dr Havery Moros---  dx 2006,  left side (rectosigmoiditis  without complications)   Venous insufficiency    lower legs   Wears hearing aid in both ears     Past Surgical History:  Procedure Laterality Date   CATARACT EXTRACTION W/ INTRAOCULAR LENS  IMPLANT, BILATERAL Bilateral 2008   COLONOSCOPY     last one 01-19-2019 by dr Havery Moros   ESOPHAGOGASTRODUODENOSCOPY (EGD) WITH ESOPHAGEAL DILATION  03/14/2016   KNEE ARTHROSCOPY Right 1990's   KNEE ARTHROSCOPY Left 08/23/2015   Procedure: ARTHROSCOPY KNEE PARTIAL MEDIAL AND LATERAL MENISCECTOMY AND DEBRIDMENT;  Surgeon: Susa Day, MD;  Location: [facility];  Service: Orthopedics;  Laterality: Left;   KNEE ARTHROSCOPY WITH MEDIAL MENISECTOMY Left 12/18/2020   Procedure: KNEE ARTHROSCOPY WITH MEDIAL AND LATERAL MENISECTOMIES;  Surgeon: Renette Butters, MD;  Location: [place];  Service: Orthopedics;  Laterality: Left;   LAPAROSCOPIC PARTIAL COLECTOMY N/A 09/15/2012   Procedure: LAPAROSCOPIC ILEOCOLECTOMY;  Surgeon: Stark Klein, MD;  Location: [place];  Service: General;  Laterality: N/A;   ORIF PATELLA Right 09/27/2019   Procedure: OPEN REDUCTION INTERNAL (ORIF) FIXATION PATELLA;  Surgeon: Renette Butters, MD;  Location: [facility];  Service: Orthopedics;  Laterality: Right;   ORIF WRIST FRACTURE Left 09/27/2019   Procedure: OPEN REDUCTION INTERNAL FIXATION (ORIF) WRIST FRACTURE;  Surgeon: Renette Butters, MD;  Location: [facility];  Service: Orthopedics;  Laterality: Left;   TONSILLECTOMY  1952   TOTAL KNEE ARTHROPLASTY Left 09/17/2021   Procedure: TOTAL KNEE ARTHROPLASTY;  Surgeon: Renette Butters, MD;  Location: [facility];  Service: Orthopedics;  Laterality: Left;   TUBAL LIGATION  1973    MEDICATIONS:  Acetaminophen (TYLENOL ARTHRITIS PAIN PO)   amLODipine (NORVASC) 5 MG tablet   aspirin EC 81 MG tablet   Chlorpheniramine Maleate (ALLERGY RELIEF PO)   cholecalciferol (VITAMIN D3) 25 MCG (1000 UNIT) tablet   Cyanocobalamin (VITAMIN B 12) 500 MCG TABS   ezetimibe (ZETIA) 10 MG  tablet   fluticasone (FLONASE) 50 MCG/ACT nasal spray   irbesartan (AVAPRO) 300 MG tablet   mesalamine (LIALDA) 1.2 g EC tablet   Multiple Vitamins-Minerals (PRESERVISION AREDS PO)   omeprazole (PRILOSEC) 20 MG capsule   ondansetron (ZOFRAN-ODT) 4 MG disintegrating tablet   oxyCODONE (ROXICODONE) 5 MG immediate release tablet   Pitavastatin Calcium 4 MG TABS   ursodiol (ACTIGALL) 300 MG capsule   No current facility-administered medications for this encounter.    Konrad Felix Ward, PA-C [facility] [phone]

## 2022-07-28 NOTE — Telephone Encounter (Addendum)
Called patient regarding results. Patient had understanding of results.----- Message from Lendon Colonel, NP sent at 07/26/2022 10:27 AM EDT ----- I have reviewed her echocardiogram results. She has normal heart pumping function Some mild stiffening on relaxation. No significant issues with heart valves. Some mild calcium on the aortic valve that is not affecting blood flow. Overall good report. No changes in her medication regimen. Decrease salt in her diet as we discussed on last office visit.   KL

## 2022-07-29 ENCOUNTER — Encounter (HOSPITAL_COMMUNITY): Admission: RE | Payer: Self-pay | Source: Home / Self Care

## 2022-07-29 ENCOUNTER — Ambulatory Visit (HOSPITAL_COMMUNITY): Admission: RE | Admit: 2022-07-29 | Payer: Medicare Other | Source: Home / Self Care | Admitting: Orthopedic Surgery

## 2022-07-29 SURGERY — ARTHROPLASTY, KNEE, TOTAL
Anesthesia: Spinal | Site: Knee | Laterality: Right

## 2022-08-05 DIAGNOSIS — I10 Essential (primary) hypertension: Secondary | ICD-10-CM | POA: Diagnosis not present

## 2022-08-05 DIAGNOSIS — R310 Gross hematuria: Secondary | ICD-10-CM | POA: Diagnosis not present

## 2022-08-06 ENCOUNTER — Telehealth: Payer: Self-pay | Admitting: Gastroenterology

## 2022-08-06 MED ORDER — MESALAMINE 1.2 G PO TBEC: 2.4000 g | DELAYED_RELEASE_TABLET | Freq: Every day | ORAL | 0 refills | Status: DC

## 2022-08-06 NOTE — Telephone Encounter (Signed)
Patient last seen 08-2021. "Continue Lialda". Ninety day script sent to pharmacy. Patient needs an annual appointment

## 2022-08-06 NOTE — Telephone Encounter (Signed)
Inbound call from patient, is requesting refill for mesalamine. Sent to [facility] in [place]

## 2022-08-19 IMAGING — DX DG KNEE 1-2V PORT*L*
1 series · 1 of 1 positions shown · non-contrast
Comparison: None Available.

CLINICAL DATA: Status post total left knee arthroplasty

EXAM:
PORTABLE LEFT KNEE - 1-2 VIEW

[knee lat]
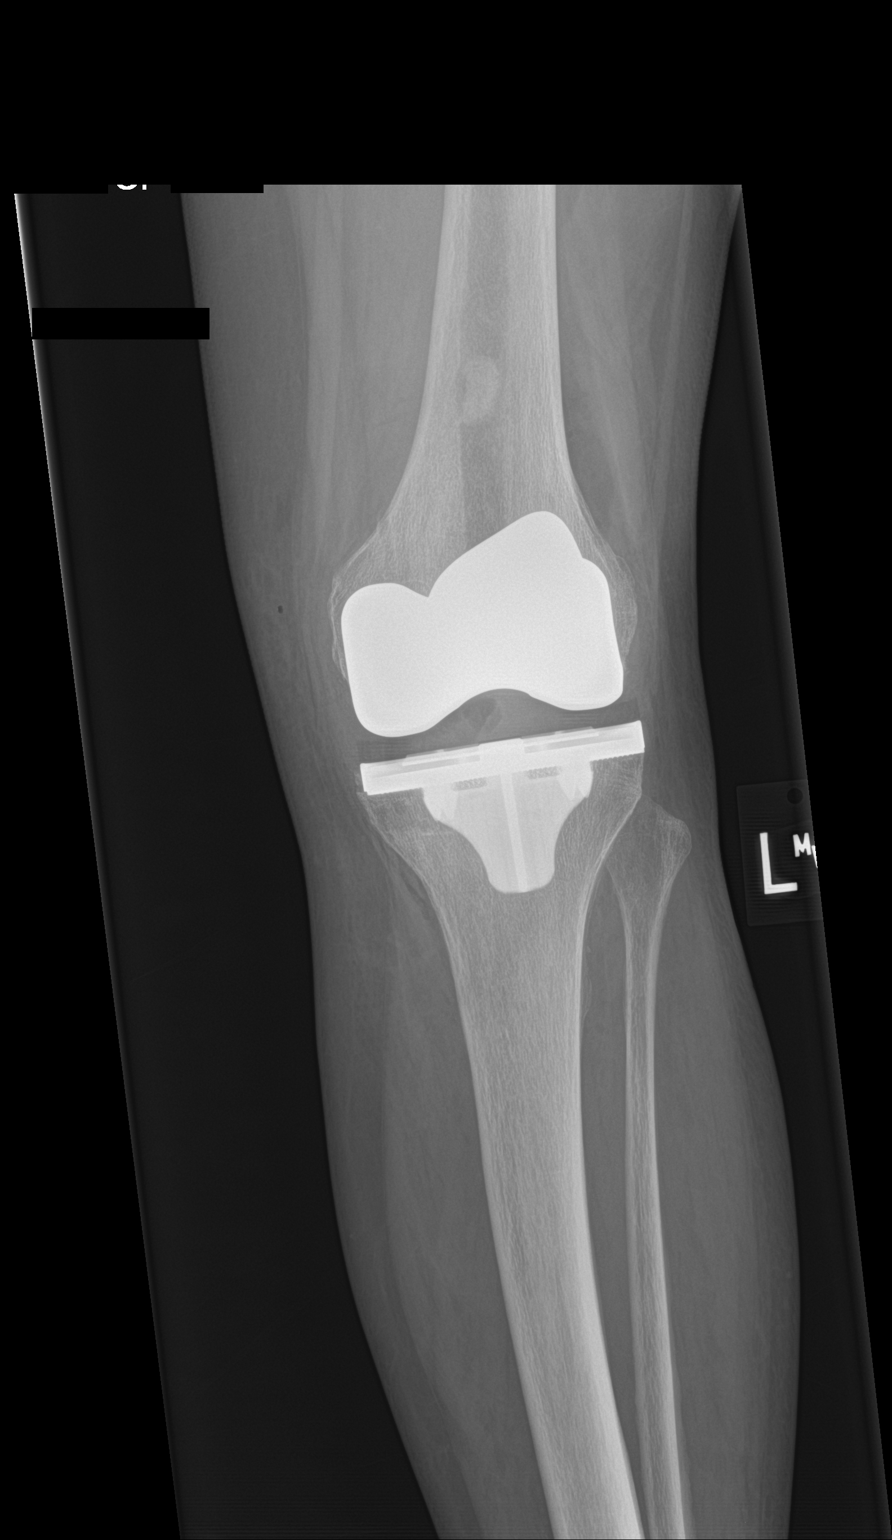

[1 of 1 positions shown; findings below may reference images not displayed]

FINDINGS: Status post left total knee arthroplasty. The prosthetic components
appear in near anatomic alignment. No perihardware lucency. No acute
fracture. Air within the joint and soft tissues is not unexpected
postoperatively.
IMPRESSION: Expected postoperative appearance, status post left total knee
arthroplasty.

## 2022-09-01 DIAGNOSIS — R310 Gross hematuria: Secondary | ICD-10-CM | POA: Diagnosis not present

## 2022-09-05 ENCOUNTER — Other Ambulatory Visit: Payer: Self-pay | Admitting: Gastroenterology

## 2022-09-22 DIAGNOSIS — H903 Sensorineural hearing loss, bilateral: Secondary | ICD-10-CM | POA: Diagnosis not present

## 2022-09-24 DIAGNOSIS — R310 Gross hematuria: Secondary | ICD-10-CM | POA: Diagnosis not present

## 2022-09-24 DIAGNOSIS — R319 Hematuria, unspecified: Secondary | ICD-10-CM | POA: Diagnosis not present

## 2022-10-17 DIAGNOSIS — R310 Gross hematuria: Secondary | ICD-10-CM | POA: Diagnosis not present

## 2022-10-17 DIAGNOSIS — N3000 Acute cystitis without hematuria: Secondary | ICD-10-CM | POA: Diagnosis not present

## 2022-10-28 DIAGNOSIS — H6122 Impacted cerumen, left ear: Secondary | ICD-10-CM | POA: Diagnosis not present

## 2022-10-29 DIAGNOSIS — M5416 Radiculopathy, lumbar region: Secondary | ICD-10-CM | POA: Diagnosis not present

## 2022-10-29 DIAGNOSIS — M5432 Sciatica, left side: Secondary | ICD-10-CM | POA: Diagnosis not present

## 2022-11-03 DIAGNOSIS — E538 Deficiency of other specified B group vitamins: Secondary | ICD-10-CM | POA: Diagnosis not present

## 2022-11-03 DIAGNOSIS — I10 Essential (primary) hypertension: Secondary | ICD-10-CM | POA: Diagnosis not present

## 2022-11-03 DIAGNOSIS — E785 Hyperlipidemia, unspecified: Secondary | ICD-10-CM | POA: Diagnosis not present

## 2022-11-03 DIAGNOSIS — M810 Age-related osteoporosis without current pathological fracture: Secondary | ICD-10-CM | POA: Diagnosis not present

## 2022-11-04 ENCOUNTER — Telehealth: Payer: Self-pay | Admitting: Gastroenterology

## 2022-11-04 NOTE — Telephone Encounter (Signed)
Inbound call from patient stating she needs a refill for mesalamine medication. States she does not have enough to last her the rest of the week. Requesting a call back regarding prescription. Please advise, thank you.

## 2022-11-05 ENCOUNTER — Other Ambulatory Visit: Payer: Self-pay

## 2022-11-05 MED ORDER — MESALAMINE 1.2 G PO TBEC: 2.4000 g | DELAYED_RELEASE_TABLET | Freq: Every day | ORAL | 0 refills | Status: DC

## 2022-11-05 NOTE — Telephone Encounter (Signed)
Called and spoke to patient.  Scheduled her for a 1 yr F/U appointment with Dr. Adela Lank on July 16th. Ninety day refill of Lialda sent to pharmacy

## 2022-11-07 ENCOUNTER — Other Ambulatory Visit: Payer: Self-pay | Admitting: Gastroenterology

## 2022-11-10 DIAGNOSIS — K219 Gastro-esophageal reflux disease without esophagitis: Secondary | ICD-10-CM | POA: Diagnosis not present

## 2022-11-10 DIAGNOSIS — I10 Essential (primary) hypertension: Secondary | ICD-10-CM | POA: Diagnosis not present

## 2022-11-10 DIAGNOSIS — M5432 Sciatica, left side: Secondary | ICD-10-CM | POA: Diagnosis not present

## 2022-11-10 DIAGNOSIS — R82998 Other abnormal findings in urine: Secondary | ICD-10-CM | POA: Diagnosis not present

## 2022-11-10 DIAGNOSIS — M1990 Unspecified osteoarthritis, unspecified site: Secondary | ICD-10-CM | POA: Diagnosis not present

## 2022-11-10 DIAGNOSIS — Z0000 Encounter for general adult medical examination without abnormal findings: Secondary | ICD-10-CM | POA: Diagnosis not present

## 2022-11-10 DIAGNOSIS — Z85038 Personal history of other malignant neoplasm of large intestine: Secondary | ICD-10-CM | POA: Diagnosis not present

## 2022-11-10 DIAGNOSIS — I7300 Raynaud's syndrome without gangrene: Secondary | ICD-10-CM | POA: Diagnosis not present

## 2022-11-10 DIAGNOSIS — M7122 Synovial cyst of popliteal space [Baker], left knee: Secondary | ICD-10-CM | POA: Diagnosis not present

## 2022-11-10 DIAGNOSIS — M5416 Radiculopathy, lumbar region: Secondary | ICD-10-CM | POA: Diagnosis not present

## 2022-11-10 DIAGNOSIS — M2032 Hallux varus (acquired), left foot: Secondary | ICD-10-CM | POA: Diagnosis not present

## 2022-11-10 DIAGNOSIS — K449 Diaphragmatic hernia without obstruction or gangrene: Secondary | ICD-10-CM | POA: Diagnosis not present

## 2022-11-10 DIAGNOSIS — M810 Age-related osteoporosis without current pathological fracture: Secondary | ICD-10-CM | POA: Diagnosis not present

## 2022-11-18 ENCOUNTER — Encounter: Payer: Self-pay | Admitting: Gastroenterology

## 2022-11-18 ENCOUNTER — Ambulatory Visit: Payer: Medicare Other | Admitting: Gastroenterology

## 2022-11-18 VITALS — BP 136/80 | HR 71 | Ht 62.0 in | Wt 127.2 lb

## 2022-11-18 DIAGNOSIS — K743 Primary biliary cirrhosis: Secondary | ICD-10-CM

## 2022-11-18 DIAGNOSIS — K5130 Ulcerative (chronic) rectosigmoiditis without complications: Secondary | ICD-10-CM

## 2022-11-18 MED ORDER — CITRUCEL PO POWD: 1.0000 | Freq: Every day | ORAL | Status: AC

## 2022-11-18 MED ORDER — URSODIOL 300 MG PO CAPS: ORAL_CAPSULE | ORAL | 3 refills | Status: DC

## 2022-11-18 MED ORDER — MESALAMINE 1.2 G PO TBEC: 2.4000 g | DELAYED_RELEASE_TABLET | Freq: Every day | ORAL | 3 refills | Status: DC

## 2022-11-18 NOTE — Patient Instructions (Addendum)
We have sent the following medications to your pharmacy for you to pick up at your convenience: Lialda Ursodiol  Please go to the lab in the basement of our building to have lab work done as you leave today. Hit "B" for basement when you get on the elevator.  When the doors open the lab is on your left.  We will call you with the results. Thank you.  Please purchase the following medications over the counter and take as directed: Citrucel- take once daily  Please follow up in 1 year.  Thank you for entrusting me with your care and for choosing [facility], Dr. Ileene Patrick  If your blood pressure at your visit was 140/90 or greater, please contact your primary care physician to follow up on this. ______________________________________________________  If you are age 67 or older, your body mass index should be between 23-30. Your Body mass index is 23.27 kg/m. If this is out of the aforementioned range listed, please consider follow up with your Primary Care Provider.  If you are age 1 or younger, your body mass index should be between 19-25. Your Body mass index is 23.27 kg/m. If this is out of the aformentioned range listed, please consider follow up with your Primary Care Provider.  ________________________________________________________  The [facility] providers would like to encourage you to use [facility] to communicate with providers for non-urgent requests or questions.  Due to long hold times on the telephone, sending your provider a message by [facility] may be a faster and more efficient way to get a response.  Please allow 48 business hours for a response.  Please remember that this is for non-urgent requests.  _______________________________________________________  Due to recent changes in healthcare laws, you may see the results of your imaging and laboratory studies on MyChart before your provider has had a chance to review them.  We understand that in some cases  there may be results that are confusing or concerning to you. Not all laboratory results come back in the same time frame and the provider may be waiting for multiple results in order to interpret others.  Please give [place] 48 hours in order for your provider to thoroughly review all the results before contacting the office for clarification of your results.

## 2022-11-18 NOTE — Progress Notes (Signed)
HPI :  88 year old female here for follow-up visit for the following issues:   UC - left sided colitis. Dx 15 years ago. Recall she has been treated with mesalamine monotherapy over the years.  Currently on Lialda 2 tabs daily, she has done well with this without flares for some time.  Her last colonoscopy was in 2020, she had no active disease and in remission.  Her last 3 colonoscopies have not shown any activity.    Since I last seen her she has been compliant with her Lialda and takes 2 tabs every day.  She states that sometimes when she urinates she can have some stool leaking out.  Stool appears to be formed.  Denies any diarrhea, denies any blood in her stools, denies any urgency.  No incontinence otherwise.  She had a fecal calprotectin in April of last year showing a normal value.  She otherwise states she is feeling pretty well today without complaints   PBC - diagnosed via liver biopsy around 2008. On Ursodiol with adjusted dosed for weight, 13-15mg  /kg. Based on her weight she has alternated 600mg  one day and then 900mg  the next.   She has had no evidence of cirrhosis on prior imaging or labs.  LFTs have remained normal as has TSH.  She had labs with Dr. Sherrye Payor done in July which I reviewed, LFTs are normal.  She is followed for osteopenia, currently on Prolia.  She had a right knee fracture a few years ago.   Recent colonoscopies: Colonoscopy 10/10/2016 - healthy colo-colonic anastomosis in the ascending colon. terminal ileum appeared normal. Many medium-mouthed diverticula were found in the left colon. A 10 mm polyp was found in the descending colon. The exam was otherwise without abnormality. Biopsies were taken with a cold forceps from the left colon for ulcerative colitis surveillance.   Colonoscopy 10/08/2017 - healthy ileo-colonic anastomosis in the ascending colon. the terminal ileum appeared normal. - Two flat polyps were found in the ascending colon. The polyps were 3 to 4 mm in  size. Two flat polyps were found in the descending colon. The polyps were 3 to 4 mm in size. A 3 mm polyp was found in the sigmoid colon. The polyp was sessile. Multiple medium-mouthed diverticula were found in the left colon. Internal hemorrhoids were found during retroflexion. Minimally active colitis   Colonoscopy 01/19/2019 - - The perianal and digital rectal examinations were normal. - The terminal ileum appeared normal. - There was evidence of a prior end-to-side ileo-colonic anastomosis in the ascending colon. This was patent and was characterized by healthy appearing mucosa. - A 3 mm polyp was found in the transverse colon. The polyp was flat. The polyp was removed with a cold snare. Resection and retrieval were complete. - Numerous flat polyps were noted in the rectum and rectosigmoid colon consistent with benign hyperplastic polyps. Two representative polyps in the recto-sigmoid colon, 5 to 6 mm in size, were removed with a cold snare as a representative sample. Resection and retrieval were complete. - Multiple large-mouthed diverticula were found in the sigmoid colon. - The exam was otherwise without abnormality. - Biopsies were taken with a cold forceps from the descending colon, sigmoid colon and rectum for ulcerative colitis surveillance. These biopsy specimens were sent to Pathology.   1. Surgical [P], colon, transverse, polyp - HYPERPLASTIC POLYP. - NO DYSPLASIA OR MALIGNANCY. 2. Surgical [P], left colon - BENIGN COLONIC MUCOSA. - NO ACTIVE INFLAMMATION. - NO DYSPLASIA OR MALIGNANCY. 3. Surgical [P],  colon, rectosigmoid, polyp (2) - HYPERPLASTIC POLYP (X2 FRAGMENTS). - NO DYSPLASIA OR MALIGNANCY.  Fecal calprotectin 08/2021: 33   Past Medical History:  Diagnosis Date   Acute meniscal tear of left knee    Arthritis    Baker cyst, left    Chronic lower back pain    Cirrhosis, primary biliary 2008   followed by dr Mortimer Fries---  dx by liver bx 2008,  per md note no  evidence on images/ labs, clinicially in remission taking ursodiol   Colon cancer ([facility])    Diverticulosis of colon    GERD (gastroesophageal reflux disease)    Heart murmur    Per Cardiology no murmur   Hiatal hernia    History of basal cell carcinoma (BCC) excision 2008   forehead   Hx of colon cancer, stage I 2014   09-15-2012  s/p laparoscopy partial colectomy,  no chemo/ radiation;  previously followed by oncologist, dr Truett Perna per released for lov note in epic 12-20-2015 with no recurrence   Hyperlipidemia, mixed    Hypertension    followed by pcp   Left knee pain    Macular degeneration of both eyes    Osteoporosis    Pneumonia    Raynaud disease    hands    Schatzki's ring    hx dilatation 11/ 2017   Ulcerative colitis    followed by dr Adela Lank---  dx 2006,  left side (rectosigmoiditis without complications)   Venous insufficiency    lower legs   Wears hearing aid in both ears      Past Surgical History:  Procedure Laterality Date   CATARACT EXTRACTION W/ INTRAOCULAR LENS  IMPLANT, BILATERAL Bilateral 2008   COLONOSCOPY     last one 01-19-2019 by dr Adela Lank   ESOPHAGOGASTRODUODENOSCOPY (EGD) WITH ESOPHAGEAL DILATION  03/14/2016   KNEE ARTHROSCOPY Right 1990's   KNEE ARTHROSCOPY Left 08/23/2015   Procedure: ARTHROSCOPY KNEE PARTIAL MEDIAL AND LATERAL MENISCECTOMY AND DEBRIDMENT;  Surgeon: Jene Every, MD;  Location: [facility];  Service: Orthopedics;  Laterality: Left;   KNEE ARTHROSCOPY WITH MEDIAL MENISECTOMY Left 12/18/2020   Procedure: KNEE ARTHROSCOPY WITH MEDIAL AND LATERAL MENISECTOMIES;  Surgeon: Sheral Apley, MD;  Location: [facility];  Service: Orthopedics;  Laterality: Left;   LAPAROSCOPIC PARTIAL COLECTOMY N/A 09/15/2012   Procedure: LAPAROSCOPIC ILEOCOLECTOMY;  Surgeon: Almond Lint, MD;  Location: [facility];  Service: General;  Laterality: N/A;   ORIF PATELLA Right 09/27/2019   Procedure: OPEN REDUCTION INTERNAL (ORIF) FIXATION  PATELLA;  Surgeon: Sheral Apley, MD;  Location: [facility];  Service: Orthopedics;  Laterality: Right;   ORIF WRIST FRACTURE Left 09/27/2019   Procedure: OPEN REDUCTION INTERNAL FIXATION (ORIF) WRIST FRACTURE;  Surgeon: Sheral Apley, MD;  Location: [facility];  Service: Orthopedics;  Laterality: Left;   TONSILLECTOMY  1952   TOTAL KNEE ARTHROPLASTY Left 09/17/2021   Procedure: TOTAL KNEE ARTHROPLASTY;  Surgeon: Sheral Apley, MD;  Location: [facility];  Service: Orthopedics;  Laterality: Left;   TUBAL LIGATION  1973   Family History  Problem Relation Age of Onset   Heart disease Mother    Heart disease Brother    Stroke Father    Lung cancer Brother    Breast cancer Paternal Aunt    Ovarian cancer Paternal Grandmother    Colon cancer Neg Hx    Esophageal cancer Neg Hx    Stomach cancer Neg Hx    Pancreatic cancer Neg Hx    Rectal cancer Neg  Hx    Social History   Tobacco Use   Smoking status: Former    Current packs/day: 0.00    Average packs/day: 0.5 packs/day for 40.0 years (20.0 ttl pk-yrs)    Types: Cigarettes    Start date: 01/16/1972    Quit date: 01/16/2012    Years since quitting: 10.8   Smokeless tobacco: Never  Vaping Use   Vaping status: Never Used  Substance Use Topics   Alcohol use: Yes    Alcohol/week: 7.0 standard drinks of alcohol    Types: 7 Glasses of wine per week    Comment: 12-13-2020  one wine daily   Drug use: No   Current Outpatient Medications  Medication Sig Dispense Refill   Acetaminophen (TYLENOL ARTHRITIS PAIN PO) Take 1,300 mg by mouth every 8 (eight) hours as needed (pain).     amLODipine (NORVASC) 5 MG tablet Take 5 mg by mouth at bedtime.     Chlorpheniramine Maleate (ALLERGY RELIEF PO) Take 1 tablet by mouth daily.     cholecalciferol (VITAMIN D3) 25 MCG (1000 UNIT) tablet Take 1,000 Units by mouth daily.     Cyanocobalamin (VITAMIN B 12) 500 MCG TABS Take 500 mcg by mouth daily. (Patient taking differently: Take 1,000 mcg by mouth  daily.) 30 tablet    ezetimibe (ZETIA) 10 MG tablet Take 5 mg by mouth every Monday, Wednesday, and Friday.     fluticasone (FLONASE) 50 MCG/ACT nasal spray Place 1 spray into both nostrils daily as needed for allergies.  1   irbesartan (AVAPRO) 300 MG tablet Take 300 mg by mouth in the morning.     mesalamine (LIALDA) 1.2 g EC tablet Take 2 tablets (2.4 g total) by mouth daily with breakfast. Please schedule a yearly follow up for any further refills. Thank you 180 tablet 0   Multiple Vitamins-Minerals (PRESERVISION AREDS PO) Take 1 tablet by mouth 2 (two) times daily.     omeprazole (PRILOSEC) 20 MG capsule TAKE ONE CAPSULE EACH DAY 30 capsule 6   Pitavastatin Calcium 4 MG TABS Take 2 mg by mouth every Monday, Wednesday, and Friday.     ursodiol (ACTIGALL) 300 MG capsule TAKE 2 CAPSULES EVERY OTHER DAY AND TAKE 3 CAPSULES EVERY OTHER DAY. Please keep your July appointment for further refills 75 capsule 0   aspirin EC 81 MG tablet Take 1 tablet (81 mg total) by mouth 2 (two) times daily. For DVT prophylaxis for 30 days after surgery. (Patient not taking: Reported on 07/17/2022) 60 tablet 0   ondansetron (ZOFRAN-ODT) 4 MG disintegrating tablet Take 1 tablet (4 mg total) by mouth 2 (two) times daily as needed for nausea or vomiting. (Patient not taking: Reported on 07/17/2022) 10 tablet 0   oxyCODONE (ROXICODONE) 5 MG immediate release tablet Take 0.5-1 tablets (2.5-5 mg total) by mouth every 6 (six) hours as needed for severe pain. Do not take more than 6 tablets in a 24 hour period. (Patient not taking: Reported on 07/17/2022) 28 tablet 0   No current facility-administered medications for this visit.   Allergies  Allergen Reactions   Codeine Nausea And Vomiting   Penicillins Hives     Review of Systems: All systems reviewed and negative except where noted in HPI.   Labs from Dr. Laurey Morale office reviewed from visit in July 2024.  Physical Exam: BP 136/80 (BP Location: Left Arm, Patient  Position: Sitting, Cuff Size: Normal)   Pulse 71   Ht 5\' 2"  (1.575 m)   Wt 127  lb 4 oz (57.7 kg)   SpO2 97%   BMI 23.27 kg/m  Constitutional: Pleasant,well-developed, female in no acute distress. Neurological: Alert and oriented to person place and time. Psychiatric: Normal mood and affect. Behavior is normal.   ASSESSMENT: 88 y.o. female here for assessment of the following  1. 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 Primary biliary cholangitis (HCC)    Colitis appears to continue to be well-controlled on Lialda.  Her prior colonoscopy showed no active inflammation.  Her fecal calprotectin over 1 year ago was normal.  She has some occasional fecal leakage when urinating however no incontinence or problems with diarrhea/urgency otherwise.  Will check a fecal calprotectin again to make sure her colitis is under good control.  Otherwise we have stopped routine surveillance colonoscopy for her if she is otherwise doing well, given her age.  Otherwise offered a trial of Citrucel once daily to see if this can help bulk stools provide some better regularity.  She is agreeable with this.  Her labs are otherwise up-to-date and normal.  We will refill her Lialda for 1 year, follow-up sooner if any issues.  PBC well-controlled on ursodiol.  Her labs are normal.  Continue ursodiol as presently prescribed.  She is following up with her primary care for osteopenia management, currently on Prolia.  I will continue to see her yearly, sooner if any issues  PLAN: - continue Lialda - refilled  - fecal calprotectin to make sure stable - start Citrucel once daily - refill Ursodiol - f/u Dr. Waynard Edwards for osteopenia / porosis, on Prolia  - f/u one year, if not sooner with issues  Harlin Rain, MD [facility]

## 2022-11-19 ENCOUNTER — Ambulatory Visit: Payer: Medicare Other

## 2022-11-19 DIAGNOSIS — K743 Primary biliary cirrhosis: Secondary | ICD-10-CM | POA: Diagnosis not present

## 2022-11-19 DIAGNOSIS — K5130 Ulcerative (chronic) rectosigmoiditis without complications: Secondary | ICD-10-CM

## 2022-11-25 LAB — CALPROTECTIN, FECAL: Calprotectin, Fecal: 27 ug/g (ref 0–120)

## 2022-12-08 ENCOUNTER — Other Ambulatory Visit: Payer: Self-pay | Admitting: Gastroenterology

## 2022-12-26 ENCOUNTER — Other Ambulatory Visit (HOSPITAL_COMMUNITY): Payer: Self-pay | Admitting: *Deleted

## 2022-12-29 ENCOUNTER — Ambulatory Visit (HOSPITAL_COMMUNITY)
Admission: RE | Admit: 2022-12-29 | Discharge: 2022-12-29 | Disposition: A | Discharge status: Home / Self Care | Payer: Medicare Other | Source: Ambulatory Visit | Attending: Internal Medicine | Admitting: Internal Medicine

## 2022-12-29 DIAGNOSIS — M810 Age-related osteoporosis without current pathological fracture: Secondary | ICD-10-CM | POA: Diagnosis not present

## 2022-12-29 MED ORDER — DENOSUMAB 60 MG/ML ~~LOC~~ SOSY
PREFILLED_SYRINGE | SUBCUTANEOUS | Status: AC
  Administered 2022-12-29: 60 mg via SUBCUTANEOUS
  Filled 2022-12-29: qty 1

## 2022-12-29 MED ORDER — DENOSUMAB 60 MG/ML ~~LOC~~ SOSY: 60.0000 mg | PREFILLED_SYRINGE | Freq: Once | SUBCUTANEOUS | Status: AC

## 2023-01-13 NOTE — Progress Notes (Signed)
Cardiology Office Note   Date:  01/16/2023   ID:  Heidi [name], DOB [birth year], MRN [id number]  PCP:  Rodrigo Ran, MD  Cardiologist:   Christopherjame Carnell [place], MD   Chief Complaint  Patient presents with   Hypertension      History of Present Illness: Heidi [name] is a 88 y.o. female who is seen for follow up. She did see me in 2008 and records indicate she had an Echo and nuclear stress test that was normal. She does not have a history of cardiac disease. She has mild hypercholesterolemia and HTN. She was seen before for pre op clearance for knee surgery but her knee pain improved and she didn't have surgery. She does walk daily. She did have a murmur noted and Echo done in March was benign. Some AV sclerosis and MV prolapse with mild MR  Past Medical History:  Diagnosis Date   Acute meniscal tear of left knee    Arthritis    Baker cyst, left    Chronic lower back pain    Cirrhosis, primary biliary 2008   followed by dr Mortimer Fries---  dx by liver bx 2008,  per md note no evidence on images/ labs, clinicially in remission taking ursodiol   Colon cancer (HCC)    Diverticulosis of colon    GERD (gastroesophageal reflux disease)    Heart murmur    Per Cardiology no murmur   Hiatal hernia    History of basal cell carcinoma (BCC) excision 2008   forehead   Hx of colon cancer, stage I 2014   09-15-2012  s/p laparoscopy partial colectomy,  no chemo/ radiation;  previously followed by oncologist, dr Truett Perna per released for lov note in epic 12-20-2015 with no recurrence   Hyperlipidemia, mixed    Hypertension    followed by pcp   Left knee pain    Macular degeneration of both eyes    Osteoporosis    Pneumonia    Raynaud disease    hands    Schatzki's ring    hx dilatation 11/ 2017   Ulcerative colitis    followed by dr Adela Lank---  dx 2006,  left side (rectosigmoiditis without complications)   Venous insufficiency    lower legs   Wears hearing aid in both ears      Past Surgical History:  Procedure Laterality Date   CATARACT EXTRACTION W/ INTRAOCULAR LENS  IMPLANT, BILATERAL Bilateral 2008   COLONOSCOPY     last one 01-19-2019 by dr Adela Lank   ESOPHAGOGASTRODUODENOSCOPY (EGD) WITH ESOPHAGEAL DILATION  03/14/2016   KNEE ARTHROSCOPY Right 1990's   KNEE ARTHROSCOPY Left 08/23/2015   Procedure: ARTHROSCOPY KNEE PARTIAL MEDIAL AND LATERAL MENISCECTOMY AND DEBRIDMENT;  Surgeon: Jene Every, MD;  Location: [facility];  Service: Orthopedics;  Laterality: Left;   KNEE ARTHROSCOPY WITH MEDIAL MENISECTOMY Left 12/18/2020   Procedure: KNEE ARTHROSCOPY WITH MEDIAL AND LATERAL MENISECTOMIES;  Surgeon: Sheral Apley, MD;  Location[facility];  Service: Orthopedics;  Laterality: Left;   LAPAROSCOPIC PARTIAL COLECTOMY N/A 09/15/2012   Procedure: LAPAROSCOPIC ILEOCOLECTOMY;  Surgeon: Almond Lint, MD;  Location: [facility];  Service: General;  Laterality: N/A;   ORIF PATELLA Right 09/27/2019   Procedure: OPEN REDUCTION INTERNAL (ORIF) FIXATION PATELLA;  Surgeon: Sheral Apley, MD;  Location: [facility];  Service: Orthopedics;  Laterality: Right;   ORIF WRIST FRACTURE Left 09/27/2019   Procedure: OPEN REDUCTION INTERNAL FIXATION (ORIF) WRIST FRACTURE;  Surgeon: Sheral Apley, MD;  Location:  [facility];  Service: Orthopedics;  Laterality: Left;   TONSILLECTOMY  1952   TOTAL KNEE ARTHROPLASTY Left 09/17/2021   Procedure: TOTAL KNEE ARTHROPLASTY;  Surgeon: Sheral Apley, MD;  Location: [facility];  Service: Orthopedics;  Laterality: Left;   TUBAL LIGATION  1973     Current Outpatient Medications  Medication Sig Dispense Refill   Acetaminophen (TYLENOL ARTHRITIS PAIN PO) Take 1,300 mg by mouth every 8 (eight) hours as needed (pain).     amLODipine (NORVASC) 5 MG tablet Take 5 mg by mouth at bedtime.     Chlorpheniramine Maleate (ALLERGY RELIEF PO) Take 1 tablet by mouth daily.     cholecalciferol (VITAMIN D3) 25 MCG (1000 UNIT) tablet Take 1,000 Units  by mouth daily.     Cyanocobalamin (VITAMIN B 12) 500 MCG TABS Take 500 mcg by mouth daily. (Patient taking differently: Take 1,000 mcg by mouth daily.) 30 tablet    ezetimibe (ZETIA) 10 MG tablet Take 5 mg by mouth every Monday, Wednesday, and Friday.     fluticasone (FLONASE) 50 MCG/ACT nasal spray Place 1 spray into both nostrils daily as needed for allergies.  1   irbesartan (AVAPRO) 300 MG tablet Take 300 mg by mouth in the morning.     mesalamine (LIALDA) 1.2 g EC tablet Take 2 tablets (2.4 g total) by mouth daily with breakfast. 180 tablet 3   methylcellulose (CITRUCEL) oral powder Take 1 packet by mouth daily.     Multiple Vitamins-Minerals (PRESERVISION AREDS PO) Take 1 tablet by mouth 2 (two) times daily.     omeprazole (PRILOSEC) 20 MG capsule TAKE ONE CAPSULE EACH DAY 30 capsule 6   Pitavastatin Calcium 4 MG TABS Take 2 mg by mouth every Monday, Wednesday, and Friday.     ursodiol (ACTIGALL) 300 MG capsule TAKE 2 CAPSULES EVERY OTHER DAY AND TAKE 3 CAPSULES EVERY OTHER DAY. 225 capsule 3   aspirin EC 81 MG tablet Take 1 tablet (81 mg total) by mouth 2 (two) times daily. For DVT prophylaxis for 30 days after surgery. (Patient not taking: Reported on 07/17/2022) 60 tablet 0   ondansetron (ZOFRAN-ODT) 4 MG disintegrating tablet Take 1 tablet (4 mg total) by mouth 2 (two) times daily as needed for nausea or vomiting. (Patient not taking: Reported on 07/17/2022) 10 tablet 0   oxyCODONE (ROXICODONE) 5 MG immediate release tablet Take 0.5-1 tablets (2.5-5 mg total) by mouth every 6 (six) hours as needed for severe pain. Do not take more than 6 tablets in a 24 hour period. (Patient not taking: Reported on 07/17/2022) 28 tablet 0   No current facility-administered medications for this visit.    Allergies:   Codeine and Penicillins    Social History:  The patient  reports that she quit smoking about 11 years ago. Her smoking use included cigarettes. She started smoking about 51 years ago. She has  a 20 pack-year smoking history. She has never used smokeless tobacco. She reports current alcohol use of about 7.0 standard drinks of alcohol per week. She reports that she does not use drugs.   Family History:  The patient's family history includes Breast cancer in her paternal aunt; Heart disease in her brother and mother; Lung cancer in her brother; Ovarian cancer in her paternal grandmother; Stroke in her father.    ROS:  Please see the history of present illness.   Otherwise, review of systems are positive for none.   All other systems are reviewed and negative.  PHYSICAL EXAM: VS:  BP 132/84   Pulse 65   Ht 5\' 2"  (1.575 m)   Wt 130 lb 6.4 oz (59.1 kg)   SpO2 95%   BMI 23.85 kg/m  , BMI Body mass index is 23.85 kg/m. GEN: Well nourished, well developed, in no acute distress HEENT: normal Neck: no JVD, carotid bruits, or masses Cardiac: RRR; soft 1/6 systolic murmur, rubs, or gallops,no edema  Respiratory:  clear to auscultation bilaterally, normal work of breathing GI: soft, nontender, nondistended, + BS MS: no deformity or atrophy Skin: warm and dry, no rash Neuro:  Strength and sensation are intact Psych: euthymic mood, full affect    Recent Labs: 07/18/2022: ALT 11; BUN 20; Creatinine, Ser 0.79; Hemoglobin 13.7; Platelets 223; Potassium 4.6; Sodium 138    Lipid Panel No results found for: "CHOL", "TRIG", "HDL", "CHOLHDL", "VLDL", "LDLCALC", "LDLDIRECT"  Dated 08/14/20: cholesterol 217, triglycerides 57, HDL 80, LDL 126. TSH normal.    Wt Readings from Last 3 Encounters:  01/16/23 130 lb 6.4 oz (59.1 kg)  11/18/22 127 lb 4 oz (57.7 kg)  07/18/22 125 lb 3.2 oz (56.8 kg)      Other studies Reviewed: Echo 07/25/22: IMPRESSIONS     1. Left ventricular ejection fraction, by estimation, is 65 to 70%. Left  ventricular ejection fraction by PLAX is 69 %. The left ventricle has  normal function. The left ventricle has no regional wall motion  abnormalities. There is  mild concentric left  ventricular hypertrophy. Left ventricular diastolic parameters are  consistent with Grade I diastolic dysfunction (impaired relaxation). The  average left ventricular global longitudinal strain is -19.6 %. The global  longitudinal strain is normal.   2. Right ventricular systolic function is normal. The right ventricular  size is normal. There is normal pulmonary artery systolic pressure.   3. The mitral valve is normal in structure. Mild mitral valve  regurgitation. No evidence of mitral stenosis. There is mild late systolic  prolapse of the middle segment of the anterior leaflet of the mitral  valve.   4. Left coronary cusp is fused. The aortic valve is tricuspid. There is  mild calcification of the aortic valve. There is mild thickening of the  aortic valve. Aortic valve regurgitation is not visualized. No aortic  stenosis is present.   5. The inferior vena cava is normal in size with greater than 50%  respiratory variability, suggesting right atrial pressure of 3 mmHg.    ASSESSMENT AND PLAN:  1. HTN on irbesartan. Controlled.  2. Hypercholesterolemia. On very low dose livalo and Zetia. Intolerant of higher dose statin.  3. Mild MV prolapse. No concern.     Current medicines are reviewed at length with the patient today.  The patient does not have concerns regarding medicines.  The following changes have been made:  no change  Labs/ tests ordered today include:  No orders of the defined types were placed in this encounter.        Disposition:   FU with me PRN  Signed, Rylinn Linzy [place], MD  01/16/2023 2:17 PM    [facility] [address] [place], [place], [address] Phone [phone], Fax [phone]

## 2023-01-16 ENCOUNTER — Encounter: Payer: Self-pay | Admitting: Cardiology

## 2023-01-16 ENCOUNTER — Ambulatory Visit: Payer: Medicare Other | Attending: Cardiology | Admitting: Cardiology

## 2023-01-16 VITALS — BP 132/84 | HR 65 | Ht 62.0 in | Wt 130.4 lb

## 2023-01-16 DIAGNOSIS — E7800 Pure hypercholesterolemia, unspecified: Secondary | ICD-10-CM | POA: Diagnosis not present

## 2023-01-16 DIAGNOSIS — I10 Essential (primary) hypertension: Secondary | ICD-10-CM

## 2023-01-16 DIAGNOSIS — I341 Nonrheumatic mitral (valve) prolapse: Secondary | ICD-10-CM

## 2023-01-16 NOTE — Patient Instructions (Signed)
Medication Instructions:  No Changes *If you need a refill on your cardiac medications before your next appointment, please call your pharmacy*   Lab Work: No Testing If you have labs (blood work) drawn today and your tests are completely normal, you will receive your results only by: MyChart Message (if you have MyChart) OR A paper copy in the mail If you have any lab test that is abnormal or we need to change your treatment, we will call you to review the results.   Testing/Procedures: No Labs   Follow-Up: At [facility], you and your health needs are our priority.  As part of our continuing mission to provide you with exceptional heart care, we have created designated Provider Care Teams.  These Care Teams include your primary Cardiologist (physician) and Advanced Practice Providers (APPs -  Physician Assistants and Nurse Practitioners) who all work together to provide you with the care you need, when you need it.  We recommend signing up for the patient portal called "MyChart".  Sign up information is provided on this After Visit Summary.  MyChart is used to connect with patients for Virtual Visits (Telemedicine).  Patients are able to view lab/test results, encounter notes, upcoming appointments, etc.  Non-urgent messages can be sent to your provider as well.   To learn more about what you can do with MyChart, go to [email or web address].    Your next appointment:   As needed  Provider:   None

## 2023-02-21 DIAGNOSIS — Z23 Encounter for immunization: Secondary | ICD-10-CM | POA: Diagnosis not present

## 2023-03-11 ENCOUNTER — Other Ambulatory Visit: Payer: Self-pay | Admitting: Internal Medicine

## 2023-03-11 DIAGNOSIS — M545 Low back pain, unspecified: Secondary | ICD-10-CM

## 2023-03-18 NOTE — Discharge Instructions (Signed)

## 2023-03-19 ENCOUNTER — Ambulatory Visit
Admission: RE | Admit: 2023-03-19 | Discharge: 2023-03-19 | Disposition: A | Discharge status: Home / Self Care | Payer: Medicare Other | Source: Ambulatory Visit | Attending: Internal Medicine | Admitting: Internal Medicine

## 2023-03-19 DIAGNOSIS — M4727 Other spondylosis with radiculopathy, lumbosacral region: Secondary | ICD-10-CM | POA: Diagnosis not present

## 2023-03-19 DIAGNOSIS — M545 Low back pain, unspecified: Secondary | ICD-10-CM

## 2023-03-19 MED ORDER — IOPAMIDOL (ISOVUE-M 200) INJECTION 41%
1.0000 mL | Freq: Once | INTRAMUSCULAR | Status: AC
  Administered 2023-03-19: 1 mL via EPIDURAL

## 2023-03-19 MED ORDER — METHYLPREDNISOLONE ACETATE 40 MG/ML INJ SUSP (RADIOLOG
80.0000 mg | Freq: Once | INTRAMUSCULAR | Status: AC
  Administered 2023-03-19: 80 mg via EPIDURAL

## 2023-04-09 ENCOUNTER — Encounter (INDEPENDENT_AMBULATORY_CARE_PROVIDER_SITE_OTHER): Payer: Medicare Other | Admitting: Ophthalmology

## 2023-04-09 DIAGNOSIS — I10 Essential (primary) hypertension: Secondary | ICD-10-CM

## 2023-04-09 DIAGNOSIS — H353132 Nonexudative age-related macular degeneration, bilateral, intermediate dry stage: Secondary | ICD-10-CM | POA: Diagnosis not present

## 2023-04-09 DIAGNOSIS — H35033 Hypertensive retinopathy, bilateral: Secondary | ICD-10-CM

## 2023-04-09 DIAGNOSIS — H43813 Vitreous degeneration, bilateral: Secondary | ICD-10-CM | POA: Diagnosis not present

## 2023-04-23 DIAGNOSIS — R310 Gross hematuria: Secondary | ICD-10-CM | POA: Diagnosis not present

## 2023-04-24 DIAGNOSIS — L720 Epidermal cyst: Secondary | ICD-10-CM | POA: Diagnosis not present

## 2023-04-24 DIAGNOSIS — D0462 Carcinoma in situ of skin of left upper limb, including shoulder: Secondary | ICD-10-CM | POA: Diagnosis not present

## 2023-04-24 DIAGNOSIS — Z85828 Personal history of other malignant neoplasm of skin: Secondary | ICD-10-CM | POA: Diagnosis not present

## 2023-04-24 DIAGNOSIS — L821 Other seborrheic keratosis: Secondary | ICD-10-CM | POA: Diagnosis not present

## 2023-06-10 DIAGNOSIS — H35371 Puckering of macula, right eye: Secondary | ICD-10-CM | POA: Diagnosis not present

## 2023-06-15 DIAGNOSIS — Z1231 Encounter for screening mammogram for malignant neoplasm of breast: Secondary | ICD-10-CM | POA: Diagnosis not present

## 2023-06-17 ENCOUNTER — Telehealth: Payer: Self-pay

## 2023-06-17 NOTE — Telephone Encounter (Signed)
Rec'd fax from [facility] of [place] regarding Tier Exception Request for Lialda. Form completed and placed on Dr. Lanetta Inch desk for his review and signature

## 2023-06-18 NOTE — Telephone Encounter (Signed)
Signed form faxed to [employer]

## 2023-06-18 NOTE — Telephone Encounter (Signed)
Tiering Exception for Mesalamine 1.2 gm tablet DR approved at Tier 1 through 06-14-2024. Customer Service  [phone] . Member ID: [id number]

## 2023-06-19 DIAGNOSIS — M5432 Sciatica, left side: Secondary | ICD-10-CM | POA: Diagnosis not present

## 2023-06-19 DIAGNOSIS — K743 Primary biliary cirrhosis: Secondary | ICD-10-CM | POA: Diagnosis not present

## 2023-06-19 DIAGNOSIS — I872 Venous insufficiency (chronic) (peripheral): Secondary | ICD-10-CM | POA: Diagnosis not present

## 2023-06-19 DIAGNOSIS — H3530 Unspecified macular degeneration: Secondary | ICD-10-CM | POA: Diagnosis not present

## 2023-06-19 DIAGNOSIS — K219 Gastro-esophageal reflux disease without esophagitis: Secondary | ICD-10-CM | POA: Diagnosis not present

## 2023-06-19 DIAGNOSIS — I7300 Raynaud's syndrome without gangrene: Secondary | ICD-10-CM | POA: Diagnosis not present

## 2023-06-19 DIAGNOSIS — I10 Essential (primary) hypertension: Secondary | ICD-10-CM | POA: Diagnosis not present

## 2023-06-19 DIAGNOSIS — Z85038 Personal history of other malignant neoplasm of large intestine: Secondary | ICD-10-CM | POA: Diagnosis not present

## 2023-06-19 DIAGNOSIS — M1990 Unspecified osteoarthritis, unspecified site: Secondary | ICD-10-CM | POA: Diagnosis not present

## 2023-06-19 DIAGNOSIS — E785 Hyperlipidemia, unspecified: Secondary | ICD-10-CM | POA: Diagnosis not present

## 2023-06-19 DIAGNOSIS — M7122 Synovial cyst of popliteal space [Baker], left knee: Secondary | ICD-10-CM | POA: Diagnosis not present

## 2023-06-19 DIAGNOSIS — M810 Age-related osteoporosis without current pathological fracture: Secondary | ICD-10-CM | POA: Diagnosis not present

## 2023-06-22 NOTE — Telephone Encounter (Signed)
Called and spoke to patient. Advised her that the request has been approved.    She wanted Dr. Adela Lank to know that Dr. Waynard Edwards recently did Liver Function labs and will be sending it to our office.   Called Dr. Laurey Morale office and requested that recent labs be sent

## 2023-06-22 NOTE — Telephone Encounter (Signed)
Inbound call from patient stating she has not heard back from [facility]. Requesting a call back to discuss further. Please advise, thank you.

## 2023-07-14 ENCOUNTER — Other Ambulatory Visit (HOSPITAL_COMMUNITY): Payer: Self-pay | Admitting: *Deleted

## 2023-07-17 ENCOUNTER — Ambulatory Visit (HOSPITAL_COMMUNITY)
Admission: RE | Admit: 2023-07-17 | Discharge: 2023-07-17 | Disposition: A | Discharge status: Home / Self Care | Source: Ambulatory Visit | Attending: Internal Medicine | Admitting: Internal Medicine

## 2023-07-17 DIAGNOSIS — M810 Age-related osteoporosis without current pathological fracture: Secondary | ICD-10-CM | POA: Diagnosis not present

## 2023-07-17 MED ORDER — DENOSUMAB 60 MG/ML ~~LOC~~ SOSY
PREFILLED_SYRINGE | SUBCUTANEOUS | Status: AC
  Filled 2023-07-17: qty 1

## 2023-07-17 MED ORDER — DENOSUMAB 60 MG/ML ~~LOC~~ SOSY
60.0000 mg | PREFILLED_SYRINGE | Freq: Once | SUBCUTANEOUS | Status: AC
  Administered 2023-07-17: 60 mg via SUBCUTANEOUS

## 2023-07-20 DIAGNOSIS — L720 Epidermal cyst: Secondary | ICD-10-CM | POA: Diagnosis not present

## 2023-07-20 DIAGNOSIS — D692 Other nonthrombocytopenic purpura: Secondary | ICD-10-CM | POA: Diagnosis not present

## 2023-07-20 DIAGNOSIS — Z85828 Personal history of other malignant neoplasm of skin: Secondary | ICD-10-CM | POA: Diagnosis not present

## 2023-07-20 DIAGNOSIS — L570 Actinic keratosis: Secondary | ICD-10-CM | POA: Diagnosis not present

## 2023-11-30 ENCOUNTER — Telehealth: Payer: Self-pay | Admitting: Gastroenterology

## 2023-11-30 ENCOUNTER — Other Ambulatory Visit: Payer: Self-pay | Admitting: Gastroenterology

## 2023-11-30 NOTE — Telephone Encounter (Signed)
 Inbound call from patient requesting a call to discuss redoing Calprotectin Fecal lab work her PCP advised her of. Please advise, thank you

## 2023-11-30 NOTE — Telephone Encounter (Signed)
 Spoke with pt and she stated that her PCP told her she should have fecal calprotectin done every . PCP did it and she states she needs to have it done at the time of her OV in Oct.

## 2023-12-16 ENCOUNTER — Other Ambulatory Visit: Payer: Self-pay

## 2023-12-16 MED ORDER — MESALAMINE 1.2 G PO TBEC: 2.4000 g | DELAYED_RELEASE_TABLET | Freq: Every day | ORAL | 1 refills | Status: DC

## 2023-12-16 NOTE — Progress Notes (Signed)
 REFILL request rec'd for mesalamine  from [facility]. Patient needs an appointment as she was last seen 11-2022.  A 30 days supply w/ 1 refill [facility]to [facility] and patient need to schedule appointment for further refills.

## 2024-01-08 ENCOUNTER — Telehealth (HOSPITAL_COMMUNITY): Payer: Self-pay

## 2024-01-08 NOTE — Telephone Encounter (Signed)
 Auth Submission: NO AUTH NEEDED Site of care: Site of care: [facility] Payer: Medicare A/B, BCBS Supplement Medication & CPT/J Code(s) submitted: Prolia  (Denosumab ) [id number] Diagnosis Code: M81.0 Route of submission (phone, fax, portal):  Phone # Fax # Auth type: Buy/Bill HB Units/visits requested: 60mg  q6 months x 2 doses Reference number:  Approval from: 01/08/24 to 06/04/24

## 2024-01-11 ENCOUNTER — Other Ambulatory Visit (HOSPITAL_COMMUNITY): Payer: Self-pay | Admitting: Internal Medicine

## 2024-01-11 DIAGNOSIS — M810 Age-related osteoporosis without current pathological fracture: Secondary | ICD-10-CM | POA: Insufficient documentation

## 2024-01-18 ENCOUNTER — Ambulatory Visit (HOSPITAL_COMMUNITY)
Admission: RE | Admit: 2024-01-18 | Discharge: 2024-01-18 | Disposition: A | Discharge status: Home / Self Care | Source: Ambulatory Visit | Attending: Internal Medicine | Admitting: Internal Medicine

## 2024-01-18 VITALS — BP 161/84 | HR 66 | Temp 97.8°F | Resp 16

## 2024-01-18 DIAGNOSIS — M810 Age-related osteoporosis without current pathological fracture: Secondary | ICD-10-CM | POA: Diagnosis not present

## 2024-01-18 MED ORDER — DENOSUMAB 60 MG/ML ~~LOC~~ SOSY
60.0000 mg | PREFILLED_SYRINGE | Freq: Once | SUBCUTANEOUS | Status: AC
  Administered 2024-01-18: 60 mg via SUBCUTANEOUS

## 2024-01-18 MED ORDER — DENOSUMAB 60 MG/ML ~~LOC~~ SOSY
PREFILLED_SYRINGE | SUBCUTANEOUS | Status: AC
Start: 2024-01-18 — End: 2024-01-18
  Filled 2024-01-18: qty 1

## 2024-02-02 ENCOUNTER — Other Ambulatory Visit: Payer: Self-pay | Admitting: Gastroenterology

## 2024-02-08 DIAGNOSIS — E785 Hyperlipidemia, unspecified: Secondary | ICD-10-CM | POA: Diagnosis not present

## 2024-02-08 DIAGNOSIS — I10 Essential (primary) hypertension: Secondary | ICD-10-CM | POA: Diagnosis not present

## 2024-02-08 DIAGNOSIS — E538 Deficiency of other specified B group vitamins: Secondary | ICD-10-CM | POA: Diagnosis not present

## 2024-02-08 DIAGNOSIS — M810 Age-related osteoporosis without current pathological fracture: Secondary | ICD-10-CM | POA: Diagnosis not present

## 2024-02-15 DIAGNOSIS — Z85038 Personal history of other malignant neoplasm of large intestine: Secondary | ICD-10-CM | POA: Diagnosis not present

## 2024-02-15 DIAGNOSIS — M810 Age-related osteoporosis without current pathological fracture: Secondary | ICD-10-CM | POA: Diagnosis not present

## 2024-02-15 DIAGNOSIS — M1990 Unspecified osteoarthritis, unspecified site: Secondary | ICD-10-CM | POA: Diagnosis not present

## 2024-02-15 DIAGNOSIS — Z0000 Encounter for general adult medical examination without abnormal findings: Secondary | ICD-10-CM | POA: Diagnosis not present

## 2024-02-15 DIAGNOSIS — K219 Gastro-esophageal reflux disease without esophagitis: Secondary | ICD-10-CM | POA: Diagnosis not present

## 2024-02-15 DIAGNOSIS — E785 Hyperlipidemia, unspecified: Secondary | ICD-10-CM | POA: Diagnosis not present

## 2024-02-15 DIAGNOSIS — R82998 Other abnormal findings in urine: Secondary | ICD-10-CM | POA: Diagnosis not present

## 2024-02-15 DIAGNOSIS — I10 Essential (primary) hypertension: Secondary | ICD-10-CM | POA: Diagnosis not present

## 2024-02-15 DIAGNOSIS — K743 Primary biliary cirrhosis: Secondary | ICD-10-CM | POA: Diagnosis not present

## 2024-02-15 DIAGNOSIS — I7300 Raynaud's syndrome without gangrene: Secondary | ICD-10-CM | POA: Diagnosis not present

## 2024-02-15 DIAGNOSIS — Z23 Encounter for immunization: Secondary | ICD-10-CM | POA: Diagnosis not present

## 2024-02-15 DIAGNOSIS — H9190 Unspecified hearing loss, unspecified ear: Secondary | ICD-10-CM | POA: Diagnosis not present

## 2024-02-15 DIAGNOSIS — H3530 Unspecified macular degeneration: Secondary | ICD-10-CM | POA: Diagnosis not present

## 2024-02-17 ENCOUNTER — Ambulatory Visit: Admitting: Gastroenterology

## 2024-02-17 VITALS — BP 124/86 | HR 94 | Ht 62.0 in | Wt 131.0 lb

## 2024-02-17 DIAGNOSIS — K743 Primary biliary cirrhosis: Secondary | ICD-10-CM | POA: Diagnosis not present

## 2024-02-17 DIAGNOSIS — K5130 Ulcerative (chronic) rectosigmoiditis without complications: Secondary | ICD-10-CM | POA: Diagnosis not present

## 2024-02-17 MED ORDER — MESALAMINE 1.2 G PO TBEC: 2.4000 g | DELAYED_RELEASE_TABLET | Freq: Every day | ORAL | 3 refills | Status: AC

## 2024-02-17 NOTE — Progress Notes (Signed)
 HPI :  89 year old female here for follow-up visit for IC and PBC   UC - left sided colitis. Dx 15 years ago. Recall she has been treated with mesalamine  monotherapy over the years.  Currently on Lialda  2 tabs daily, she has done well with this without flares for some time.  Her last colonoscopy was in 2020, she had no active disease and in remission.  Her last 3 colonoscopies have not shown any activity.     She continues to do well since have last seen her.  She has some food intolerance that can stimulate loose stools however generally her bowels are pretty stable.  She states she is compliant with the medications.  She denies any complaints.  She had a fecal calprotectin done July 2024 which was 27.  This has been stable now for a few years since we have been checking it.  She denies complaints.  No abdominal pains.    PBC - diagnosed via liver biopsy around 2008. On Ursodiol  with adjusted dosed for weight, 13-15mg  /kg. Based on her weight she has alternated 600mg  one day and then 900mg  the next.   She has had no evidence of cirrhosis on prior imaging or labs.  LFTs have remained normal as has TSH.  She is followed for osteopenia, currently on Prolia .    She states she is compliant with her ursodiol .  She brings in labs that she had done with Dr. Shayne as outlined  02/08/24: AP 65, AST 15, ALT 10, T bil 0.9 BUN 15, Cr 0.7 Hgb 13.6, plt 222    Recent colonoscopies: Colonoscopy 10/10/2016 - healthy colo-colonic anastomosis in the ascending colon. terminal ileum appeared normal. Many medium-mouthed diverticula were found in the left colon. A 10 mm polyp was found in the descending colon. The exam was otherwise without abnormality. Biopsies were taken with a cold forceps from the left colon for ulcerative colitis surveillance.   Colonoscopy 10/08/2017 - healthy ileo-colonic anastomosis in the ascending colon. the terminal ileum appeared normal. - Two flat polyps were found in the ascending  colon. The polyps were 3 to 4 mm in size. Two flat polyps were found in the descending colon. The polyps were 3 to 4 mm in size. A 3 mm polyp was found in the sigmoid colon. The polyp was sessile. Multiple medium-mouthed diverticula were found in the left colon. Internal hemorrhoids were found during retroflexion. Minimally active colitis   Colonoscopy 01/19/2019 - - The perianal and digital rectal examinations were normal. - The terminal ileum appeared normal. - There was evidence of a prior end-to-side ileo-colonic anastomosis in the ascending colon. This was patent and was characterized by healthy appearing mucosa. - A 3 mm polyp was found in the transverse colon. The polyp was flat. The polyp was removed with a cold snare. Resection and retrieval were complete. - Numerous flat polyps were noted in the rectum and rectosigmoid colon consistent with benign hyperplastic polyps. Two representative polyps in the recto-sigmoid colon, 5 to 6 mm in size, were removed with a cold snare as a representative sample. Resection and retrieval were complete. - Multiple large-mouthed diverticula were found in the sigmoid colon. - The exam was otherwise without abnormality. - Biopsies were taken with a cold forceps from the descending colon, sigmoid colon and rectum for ulcerative colitis surveillance. These biopsy specimens were sent to Pathology.   1. Surgical [P], colon, transverse, polyp - HYPERPLASTIC POLYP. - NO DYSPLASIA OR MALIGNANCY. 2. Surgical [P], left colon -  BENIGN COLONIC MUCOSA. - NO ACTIVE INFLAMMATION. - NO DYSPLASIA OR MALIGNANCY. 3. Surgical [P], colon, rectosigmoid, polyp (2) - HYPERPLASTIC POLYP (X2 FRAGMENTS). - NO DYSPLASIA OR MALIGNANCY.   Fecal calprotectin 08/2021: 33   Fecal calprotectin 11/19/22: 27  Past Medical History:  Diagnosis Date   Acute meniscal tear of left knee    Arthritis    Baker cyst, left    Chronic lower back pain    Cirrhosis, primary biliary 2008    followed by dr glo---  dx by liver bx 2008,  per md note no evidence on images/ labs, clinicially in remission taking ursodiol    Colon cancer (HCC)    Diverticulosis of colon    GERD (gastroesophageal reflux disease)    Heart murmur    Per Cardiology no murmur   Hiatal hernia    History of basal cell carcinoma (BCC) excision 2008   forehead   Hx of colon cancer, stage I 2014   09-15-2012  s/p laparoscopy partial colectomy,  no chemo/ radiation;  previously followed by oncologist, dr cloretta per released for lov note in epic 12-20-2015 with no recurrence   Hyperlipidemia, mixed    Hypertension    followed by pcp   Left knee pain    Macular degeneration of both eyes    Osteoporosis    Pneumonia    Raynaud disease    hands    Schatzki's ring    hx dilatation 11/ 2017   Ulcerative colitis    followed by dr leigh---  dx 2006,  left side (rectosigmoiditis without complications)   Venous insufficiency    lower legs   Wears hearing aid in both ears      Past Surgical History:  Procedure Laterality Date   CATARACT EXTRACTION W/ INTRAOCULAR LENS  IMPLANT, BILATERAL Bilateral 2008   COLONOSCOPY     last one 01-19-2019 by dr leigh   ESOPHAGOGASTRODUODENOSCOPY (EGD) WITH ESOPHAGEAL DILATION  03/14/2016   KNEE ARTHROSCOPY Right 1990's   KNEE ARTHROSCOPY Left 08/23/2015   Procedure: ARTHROSCOPY KNEE PARTIAL MEDIAL AND LATERAL MENISCECTOMY AND DEBRIDMENT;  Surgeon: Reyes Billing, MD;  Location: [facility];  Service: Orthopedics;  Laterality: Left;   KNEE ARTHROSCOPY WITH MEDIAL MENISECTOMY Left 12/18/2020   Procedure: KNEE ARTHROSCOPY WITH MEDIAL AND LATERAL MENISECTOMIES;  Surgeon: Beverley Evalene BIRCH, MD;  Location: [facility];  Service: Orthopedics;  Laterality: Left;   LAPAROSCOPIC PARTIAL COLECTOMY N/A 09/15/2012   Procedure: LAPAROSCOPIC ILEOCOLECTOMY;  Surgeon: Jina Nephew, MD;  Location: [facility];  Service: General;  Laterality: N/A;   ORIF PATELLA Right  09/27/2019   Procedure: OPEN REDUCTION INTERNAL (ORIF) FIXATION PATELLA;  Surgeon: Beverley Evalene BIRCH, MD;  Location: [facility];  Service: Orthopedics;  Laterality: Right;   ORIF WRIST FRACTURE Left 09/27/2019   Procedure: OPEN REDUCTION INTERNAL FIXATION (ORIF) WRIST FRACTURE;  Surgeon: Beverley Evalene BIRCH, MD;  Location: [facility];  Service: Orthopedics;  Laterality: Left;   TONSILLECTOMY  1952   TOTAL KNEE ARTHROPLASTY Left 09/17/2021   Procedure: TOTAL KNEE ARTHROPLASTY;  Surgeon: Beverley Evalene BIRCH, MD;  Location: [facility];  Service: Orthopedics;  Laterality: Left;   TUBAL LIGATION  1973   Family History  Problem Relation Age of Onset   Heart disease Mother    Heart disease Brother    Stroke Father    Lung cancer Brother    Breast cancer Paternal Aunt    Ovarian cancer Paternal Grandmother    Colon cancer Neg Hx    Esophageal cancer Neg  Hx    Stomach cancer Neg Hx    Pancreatic cancer Neg Hx    Rectal cancer Neg Hx    Social History   Tobacco Use   Smoking status: Former    Current packs/day: 0.00    Average packs/day: 0.5 packs/day for 40.0 years (20.0 ttl pk-yrs)    Types: Cigarettes    Start date: 01/16/1972    Quit date: 01/16/2012    Years since quitting: 12.0   Smokeless tobacco: Never  Vaping Use   Vaping status: Never Used  Substance Use Topics   Alcohol  use: Yes    Alcohol /week: 7.0 standard drinks of alcohol     Types: 7 Glasses of wine per week    Comment: 12-13-2020  one wine daily   Drug use: No   Current Outpatient Medications  Medication Sig Dispense Refill   Acetaminophen  (TYLENOL  ARTHRITIS PAIN PO) Take 1,300 mg by mouth every 8 (eight) hours as needed (pain).     amLODipine (NORVASC) 5 MG tablet Take 5 mg by mouth at bedtime.     Chlorpheniramine Maleate (ALLERGY RELIEF PO) Take 1 tablet by mouth daily.     cholecalciferol  (VITAMIN D3) 25 MCG (1000 UNIT) tablet Take 1,000 Units by mouth daily.     Cyanocobalamin  (VITAMIN B 12) 500 MCG TABS Take 500 mcg by mouth  daily. (Patient taking differently: Take 1,000 mcg by mouth daily.) 30 tablet    ezetimibe (ZETIA) 10 MG tablet Take 5 mg by mouth every Monday, Wednesday, and Friday.     fluticasone  (FLONASE ) 50 MCG/ACT nasal spray Place 1 spray into both nostrils daily as needed for allergies.  1   irbesartan  (AVAPRO ) 300 MG tablet Take 300 mg by mouth in the morning.     mesalamine  (LIALDA ) 1.2 g EC tablet Take 2 tablets (2.4 g total) by mouth daily with breakfast. PLEASE SCHEDULE A YEARLY OFFICE VISIT FOR FURTHER REFILLS. THANK YOU 60 tablet 1   methylcellulose (CITRUCEL) oral powder Take 1 packet by mouth daily.     Multiple Vitamins-Minerals (PRESERVISION AREDS PO) Take 1 tablet by mouth 2 (two) times daily.     omeprazole  (PRILOSEC) 20 MG capsule TAKE ONE CAPSULE EACH DAY 30 capsule 6   ondansetron  (ZOFRAN -ODT) 4 MG disintegrating tablet Take 1 tablet (4 mg total) by mouth 2 (two) times daily as needed for nausea or vomiting. 10 tablet 0   Pitavastatin Calcium 4 MG TABS Take 2 mg by mouth every Monday, Wednesday, and Friday.     ursodiol  (ACTIGALL ) 300 MG capsule TAKE TWO CAPSULES EVERY OTHER DAY alternating with THREE capsules EVERY OTHER DAY. Please keep your October appointment for further refills. Thank you 75 capsule 0   No current facility-administered medications for this visit.   Allergies  Allergen Reactions   Codeine Nausea And Vomiting   Penicillins Hives     Review of Systems: All systems reviewed and negative except where noted in HPI.   Labs per HPI 10/6 Dr. Sheppard office  Physical Exam: BP 124/86 (BP Location: Left Arm)   Pulse 94   Ht 5' 2 (1.575 m)   Wt 131 lb (59.4 kg)   BMI 23.96 kg/m  Constitutional: Pleasant,well-developed, female in no acute distress. Neurological: Alert and oriented to person place and time. Psychiatric: Normal mood and affect. Behavior is normal.   ASSESSMENT: 89 y.o. female here for assessment of the following  1. 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 Primary biliary cholangitis (HCC)    Doing  well on longstanding Lialda .  She tolerates this well and it controls her symptoms.  She has had a few fecal calprotectin's in recent years that have been entirely normal.  Her last colonoscopy in 2020 showed no active inflammation.  We discussed moving forward that at her age we have her go on further surveillance exams especially given she has been feeling well and doing well on her current regimen.  Further, given her labs are entirely normal and she has no symptoms, I do not feel strongly we need to pursue fecal calprotectin testing as well.  If she has symptoms that bother her she will let me know and we can adjust her regimen as needed but hopefully she continues to be well-controlled on the current regimen.  No anemia.  PBC remains well-controlled.  She is compliant with ursodiol  and her labs are entirely normal.  Continue current regimen.  She will have labs yearly with Dr. Mathews and continue to see me once yearly.  I refilled her medications today  Marcey Naval, MD [facility]

## 2024-02-17 NOTE — Patient Instructions (Addendum)
 We have sent the following medications to your pharmacy for you to pick up at your convenience: Lialda  1.2 mg: Take 2 tablets (2.4mg ) daily   Please follow up in 1 year.  Thank you for entrusting me with your care and for choosing [facility], Dr. Elspeth Naval    _______________________________________________________  If your blood pressure at your visit was 140/90 or greater, please contact your primary care physician to follow up on this.  _______________________________________________________  If you are age 87 or older, your body mass index should be between 23-30. Your Body mass index is 23.96 kg/m. If this is out of the aforementioned range listed, please consider follow up with your Primary Care Provider.  If you are age 87 or younger, your body mass index should be between 19-25. Your Body mass index is 23.96 kg/m. If this is out of the aformentioned range listed, please consider follow up with your Primary Care Provider.   ________________________________________________________  The [facility] providers would like to encourage you to use MYCHART to communicate with providers for non-urgent requests or questions.  Due to long hold times on the telephone, sending your provider a message by [facility] may be a faster and more efficient way to get a response.  Please allow 48 business hours for a response.  Please remember that this is for non-urgent requests.  _______________________________________________________  [facility] is using a team-based approach to care.  Your team is made up of your doctor and two to three APPS. Our APPS (Nurse Practitioners and Physician Assistants) work with your physician to ensure care continuity for you. They are fully qualified to address your health concerns and develop a treatment plan. They communicate directly with your gastroenterologist to care for you. Seeing the Advanced Practice Practitioners on your physician's  team can help you by facilitating care more promptly, often allowing for earlier appointments, access to diagnostic testing, procedures, and other specialty referrals.

## 2024-03-08 ENCOUNTER — Other Ambulatory Visit: Payer: Self-pay | Admitting: Gastroenterology

## 2024-03-18 DIAGNOSIS — H903 Sensorineural hearing loss, bilateral: Secondary | ICD-10-CM | POA: Diagnosis not present

## 2024-03-22 DIAGNOSIS — Z974 Presence of external hearing-aid: Secondary | ICD-10-CM | POA: Diagnosis not present

## 2024-03-22 DIAGNOSIS — H6123 Impacted cerumen, bilateral: Secondary | ICD-10-CM | POA: Diagnosis not present

## 2024-03-22 DIAGNOSIS — H903 Sensorineural hearing loss, bilateral: Secondary | ICD-10-CM | POA: Diagnosis not present

## 2024-04-08 ENCOUNTER — Encounter (INDEPENDENT_AMBULATORY_CARE_PROVIDER_SITE_OTHER): Payer: Medicare Other | Admitting: Ophthalmology

## 2024-04-14 ENCOUNTER — Encounter (INDEPENDENT_AMBULATORY_CARE_PROVIDER_SITE_OTHER): Admitting: Ophthalmology

## 2024-04-14 DIAGNOSIS — H43813 Vitreous degeneration, bilateral: Secondary | ICD-10-CM

## 2024-04-14 DIAGNOSIS — H35033 Hypertensive retinopathy, bilateral: Secondary | ICD-10-CM

## 2024-04-14 DIAGNOSIS — H353132 Nonexudative age-related macular degeneration, bilateral, intermediate dry stage: Secondary | ICD-10-CM

## 2024-04-14 DIAGNOSIS — I10 Essential (primary) hypertension: Secondary | ICD-10-CM | POA: Diagnosis not present

## 2024-05-12 ENCOUNTER — Other Ambulatory Visit: Payer: Self-pay | Admitting: Gastroenterology

## 2024-07-18 ENCOUNTER — Inpatient Hospital Stay (HOSPITAL_COMMUNITY): Admission: RE | Admit: 2024-07-18 | Source: Ambulatory Visit

## 2025-04-14 ENCOUNTER — Encounter (INDEPENDENT_AMBULATORY_CARE_PROVIDER_SITE_OTHER): Admitting: Ophthalmology
